# Patient Record
Sex: Female | Born: 1960
Health system: Southern US, Community
[De-identification: ages and names within clinical notes are randomized; demographics above are authoritative.]

## PROBLEM LIST (undated history)

## (undated) DIAGNOSIS — I10 Essential (primary) hypertension: Secondary | ICD-10-CM

## (undated) DIAGNOSIS — M797 Fibromyalgia: Secondary | ICD-10-CM

## (undated) DIAGNOSIS — M792 Neuralgia and neuritis, unspecified: Secondary | ICD-10-CM

## (undated) DIAGNOSIS — E119 Type 2 diabetes mellitus without complications: Secondary | ICD-10-CM

## (undated) DIAGNOSIS — I639 Cerebral infarction, unspecified: Secondary | ICD-10-CM

## (undated) DIAGNOSIS — R252 Cramp and spasm: Secondary | ICD-10-CM

## (undated) HISTORY — PX: CHOLECYSTECTOMY: SHX55

## (undated) HISTORY — PX: APPENDECTOMY: SHX54

## (undated) HISTORY — PX: BACK SURGERY: SHX140

## (undated) HISTORY — PX: ABDOMINAL HYSTERECTOMY: SHX81

## (undated) HISTORY — PX: OTHER SURGICAL HISTORY: SHX169

---

## 2004-11-10 ENCOUNTER — Emergency Department: Payer: Self-pay | Admitting: Emergency Medicine

## 2005-05-24 ENCOUNTER — Other Ambulatory Visit: Payer: Self-pay

## 2005-05-31 ENCOUNTER — Ambulatory Visit: Payer: Self-pay | Admitting: Unknown Physician Specialty

## 2005-06-28 ENCOUNTER — Emergency Department: Payer: Self-pay | Admitting: Emergency Medicine

## 2005-12-01 ENCOUNTER — Ambulatory Visit: Payer: Self-pay | Admitting: Internal Medicine

## 2005-12-04 ENCOUNTER — Ambulatory Visit: Payer: Self-pay | Admitting: Cardiology

## 2005-12-18 ENCOUNTER — Ambulatory Visit: Payer: Self-pay | Admitting: Unknown Physician Specialty

## 2006-02-27 HISTORY — PX: OTHER SURGICAL HISTORY: SHX169

## 2006-03-23 ENCOUNTER — Ambulatory Visit: Payer: Self-pay | Admitting: Unknown Physician Specialty

## 2006-06-29 ENCOUNTER — Ambulatory Visit: Payer: Self-pay | Admitting: Gastroenterology

## 2006-09-12 ENCOUNTER — Ambulatory Visit: Payer: Self-pay | Admitting: Pain Medicine

## 2006-10-15 ENCOUNTER — Ambulatory Visit: Payer: Self-pay | Admitting: Pain Medicine

## 2006-10-25 ENCOUNTER — Ambulatory Visit: Payer: Self-pay | Admitting: Pain Medicine

## 2006-11-12 ENCOUNTER — Ambulatory Visit: Payer: Self-pay | Admitting: Pain Medicine

## 2006-11-22 ENCOUNTER — Ambulatory Visit: Payer: Self-pay | Admitting: Pain Medicine

## 2006-11-23 ENCOUNTER — Ambulatory Visit: Payer: Self-pay | Admitting: Surgery

## 2006-11-28 ENCOUNTER — Ambulatory Visit: Payer: Self-pay | Admitting: Surgery

## 2007-03-13 ENCOUNTER — Ambulatory Visit: Payer: Self-pay | Admitting: Pain Medicine

## 2007-03-26 ENCOUNTER — Ambulatory Visit: Payer: Self-pay | Admitting: Pain Medicine

## 2007-04-11 ENCOUNTER — Ambulatory Visit: Payer: Self-pay | Admitting: Physician Assistant

## 2007-04-17 ENCOUNTER — Ambulatory Visit: Payer: Self-pay | Admitting: Internal Medicine

## 2007-05-07 ENCOUNTER — Ambulatory Visit: Payer: Self-pay | Admitting: Pain Medicine

## 2007-05-20 ENCOUNTER — Ambulatory Visit: Payer: Self-pay | Admitting: Pain Medicine

## 2007-06-18 ENCOUNTER — Ambulatory Visit: Payer: Self-pay | Admitting: Physician Assistant

## 2007-07-09 ENCOUNTER — Ambulatory Visit: Payer: Self-pay | Admitting: Pain Medicine

## 2007-07-17 ENCOUNTER — Ambulatory Visit: Payer: Self-pay | Admitting: Pain Medicine

## 2007-07-25 ENCOUNTER — Ambulatory Visit: Payer: Self-pay | Admitting: Neurological Surgery

## 2007-07-26 ENCOUNTER — Ambulatory Visit: Payer: Self-pay | Admitting: Neurological Surgery

## 2007-08-15 ENCOUNTER — Ambulatory Visit: Payer: Self-pay | Admitting: Physician Assistant

## 2007-09-18 ENCOUNTER — Ambulatory Visit: Payer: Self-pay | Admitting: Physician Assistant

## 2007-09-24 ENCOUNTER — Ambulatory Visit: Payer: Self-pay | Admitting: Pain Medicine

## 2007-10-15 ENCOUNTER — Ambulatory Visit: Payer: Self-pay | Admitting: Physician Assistant

## 2007-11-13 ENCOUNTER — Ambulatory Visit: Payer: Self-pay | Admitting: Pain Medicine

## 2007-12-12 ENCOUNTER — Ambulatory Visit: Payer: Self-pay | Admitting: Physician Assistant

## 2008-01-08 ENCOUNTER — Ambulatory Visit: Payer: Self-pay | Admitting: Physician Assistant

## 2008-02-05 ENCOUNTER — Ambulatory Visit: Payer: Self-pay | Admitting: Physician Assistant

## 2008-03-11 ENCOUNTER — Ambulatory Visit: Payer: Self-pay | Admitting: Physician Assistant

## 2008-03-21 ENCOUNTER — Ambulatory Visit: Payer: Self-pay | Admitting: Internal Medicine

## 2008-04-09 ENCOUNTER — Emergency Department: Payer: Self-pay | Admitting: Emergency Medicine

## 2008-04-10 ENCOUNTER — Ambulatory Visit: Payer: Self-pay | Admitting: Orthopedic Surgery

## 2008-04-14 ENCOUNTER — Ambulatory Visit: Payer: Self-pay | Admitting: Physician Assistant

## 2008-05-13 ENCOUNTER — Ambulatory Visit: Payer: Self-pay | Admitting: Physician Assistant

## 2008-06-10 ENCOUNTER — Ambulatory Visit: Payer: Self-pay | Admitting: Orthopedic Surgery

## 2008-08-12 ENCOUNTER — Ambulatory Visit: Payer: Self-pay | Admitting: Physician Assistant

## 2008-11-11 ENCOUNTER — Ambulatory Visit: Payer: Self-pay | Admitting: Physician Assistant

## 2008-12-22 ENCOUNTER — Ambulatory Visit: Payer: Self-pay | Admitting: Physician Assistant

## 2009-02-16 ENCOUNTER — Ambulatory Visit: Payer: Self-pay | Admitting: Physician Assistant

## 2009-04-19 ENCOUNTER — Ambulatory Visit: Payer: Self-pay | Admitting: Pain Medicine

## 2009-05-27 ENCOUNTER — Ambulatory Visit: Payer: Self-pay | Admitting: Pain Medicine

## 2009-06-10 ENCOUNTER — Ambulatory Visit: Payer: Self-pay | Admitting: Pain Medicine

## 2009-08-03 ENCOUNTER — Ambulatory Visit: Payer: Self-pay | Admitting: Urology

## 2009-08-17 ENCOUNTER — Ambulatory Visit: Payer: Self-pay | Admitting: Urology

## 2009-08-31 ENCOUNTER — Ambulatory Visit: Payer: Self-pay | Admitting: Urology

## 2010-09-12 ENCOUNTER — Ambulatory Visit: Payer: Self-pay | Admitting: Family

## 2010-11-09 ENCOUNTER — Ambulatory Visit: Payer: Self-pay | Admitting: Gastroenterology

## 2011-03-10 DIAGNOSIS — E119 Type 2 diabetes mellitus without complications: Secondary | ICD-10-CM | POA: Diagnosis not present

## 2011-03-10 DIAGNOSIS — F341 Dysthymic disorder: Secondary | ICD-10-CM | POA: Diagnosis not present

## 2011-03-10 DIAGNOSIS — R1011 Right upper quadrant pain: Secondary | ICD-10-CM | POA: Diagnosis not present

## 2011-03-10 DIAGNOSIS — G894 Chronic pain syndrome: Secondary | ICD-10-CM | POA: Diagnosis not present

## 2011-03-16 DIAGNOSIS — I10 Essential (primary) hypertension: Secondary | ICD-10-CM | POA: Diagnosis not present

## 2011-03-16 DIAGNOSIS — E119 Type 2 diabetes mellitus without complications: Secondary | ICD-10-CM | POA: Diagnosis not present

## 2011-03-16 DIAGNOSIS — Z794 Long term (current) use of insulin: Secondary | ICD-10-CM | POA: Diagnosis not present

## 2011-03-16 DIAGNOSIS — Z79899 Other long term (current) drug therapy: Secondary | ICD-10-CM | POA: Diagnosis not present

## 2011-03-16 DIAGNOSIS — R109 Unspecified abdominal pain: Secondary | ICD-10-CM | POA: Diagnosis not present

## 2011-03-16 DIAGNOSIS — M549 Dorsalgia, unspecified: Secondary | ICD-10-CM | POA: Diagnosis not present

## 2011-03-16 DIAGNOSIS — Z888 Allergy status to other drugs, medicaments and biological substances status: Secondary | ICD-10-CM | POA: Diagnosis not present

## 2011-04-07 DIAGNOSIS — M729 Fibroblastic disorder, unspecified: Secondary | ICD-10-CM | POA: Diagnosis not present

## 2011-04-07 DIAGNOSIS — G894 Chronic pain syndrome: Secondary | ICD-10-CM | POA: Diagnosis not present

## 2011-04-07 DIAGNOSIS — E119 Type 2 diabetes mellitus without complications: Secondary | ICD-10-CM | POA: Diagnosis not present

## 2011-04-07 DIAGNOSIS — Z87891 Personal history of nicotine dependence: Secondary | ICD-10-CM | POA: Diagnosis not present

## 2011-05-01 DIAGNOSIS — M729 Fibroblastic disorder, unspecified: Secondary | ICD-10-CM | POA: Diagnosis not present

## 2011-05-01 DIAGNOSIS — R112 Nausea with vomiting, unspecified: Secondary | ICD-10-CM | POA: Diagnosis not present

## 2011-05-01 DIAGNOSIS — G894 Chronic pain syndrome: Secondary | ICD-10-CM | POA: Diagnosis not present

## 2011-05-01 DIAGNOSIS — E119 Type 2 diabetes mellitus without complications: Secondary | ICD-10-CM | POA: Diagnosis not present

## 2011-05-03 ENCOUNTER — Ambulatory Visit: Payer: Self-pay | Admitting: Gastroenterology

## 2011-05-03 DIAGNOSIS — R1011 Right upper quadrant pain: Secondary | ICD-10-CM | POA: Diagnosis not present

## 2011-05-03 DIAGNOSIS — Z9079 Acquired absence of other genital organ(s): Secondary | ICD-10-CM | POA: Diagnosis not present

## 2011-05-03 DIAGNOSIS — I10 Essential (primary) hypertension: Secondary | ICD-10-CM | POA: Diagnosis not present

## 2011-05-03 DIAGNOSIS — Z79899 Other long term (current) drug therapy: Secondary | ICD-10-CM | POA: Diagnosis not present

## 2011-05-03 DIAGNOSIS — R1031 Right lower quadrant pain: Secondary | ICD-10-CM | POA: Diagnosis not present

## 2011-05-03 DIAGNOSIS — J449 Chronic obstructive pulmonary disease, unspecified: Secondary | ICD-10-CM | POA: Diagnosis not present

## 2011-05-03 DIAGNOSIS — Z9889 Other specified postprocedural states: Secondary | ICD-10-CM | POA: Diagnosis not present

## 2011-05-03 DIAGNOSIS — F17200 Nicotine dependence, unspecified, uncomplicated: Secondary | ICD-10-CM | POA: Diagnosis not present

## 2011-05-03 DIAGNOSIS — E119 Type 2 diabetes mellitus without complications: Secondary | ICD-10-CM | POA: Diagnosis not present

## 2011-05-03 DIAGNOSIS — K5730 Diverticulosis of large intestine without perforation or abscess without bleeding: Secondary | ICD-10-CM | POA: Diagnosis not present

## 2011-05-03 DIAGNOSIS — R1310 Dysphagia, unspecified: Secondary | ICD-10-CM | POA: Diagnosis not present

## 2011-05-30 DIAGNOSIS — E119 Type 2 diabetes mellitus without complications: Secondary | ICD-10-CM | POA: Diagnosis not present

## 2011-05-30 DIAGNOSIS — G894 Chronic pain syndrome: Secondary | ICD-10-CM | POA: Diagnosis not present

## 2011-05-30 DIAGNOSIS — M729 Fibroblastic disorder, unspecified: Secondary | ICD-10-CM | POA: Diagnosis not present

## 2011-05-30 DIAGNOSIS — N958 Other specified menopausal and perimenopausal disorders: Secondary | ICD-10-CM | POA: Diagnosis not present

## 2011-07-04 DIAGNOSIS — E119 Type 2 diabetes mellitus without complications: Secondary | ICD-10-CM | POA: Diagnosis not present

## 2011-07-04 DIAGNOSIS — M549 Dorsalgia, unspecified: Secondary | ICD-10-CM | POA: Diagnosis not present

## 2011-07-04 DIAGNOSIS — G894 Chronic pain syndrome: Secondary | ICD-10-CM | POA: Diagnosis not present

## 2011-07-04 DIAGNOSIS — M729 Fibroblastic disorder, unspecified: Secondary | ICD-10-CM | POA: Diagnosis not present

## 2011-08-01 DIAGNOSIS — G894 Chronic pain syndrome: Secondary | ICD-10-CM | POA: Diagnosis not present

## 2011-08-01 DIAGNOSIS — E119 Type 2 diabetes mellitus without complications: Secondary | ICD-10-CM | POA: Diagnosis not present

## 2011-08-01 DIAGNOSIS — K5909 Other constipation: Secondary | ICD-10-CM | POA: Diagnosis not present

## 2011-08-01 DIAGNOSIS — IMO0001 Reserved for inherently not codable concepts without codable children: Secondary | ICD-10-CM | POA: Diagnosis not present

## 2011-08-22 ENCOUNTER — Other Ambulatory Visit: Payer: Self-pay | Admitting: Family

## 2011-08-22 DIAGNOSIS — E119 Type 2 diabetes mellitus without complications: Secondary | ICD-10-CM | POA: Diagnosis not present

## 2011-08-22 DIAGNOSIS — I10 Essential (primary) hypertension: Secondary | ICD-10-CM | POA: Diagnosis not present

## 2011-08-22 DIAGNOSIS — E785 Hyperlipidemia, unspecified: Secondary | ICD-10-CM | POA: Diagnosis not present

## 2011-08-22 LAB — CBC WITH DIFFERENTIAL/PLATELET
Basophil #: 0.1 10*3/uL (ref 0.0–0.1)
Basophil %: 0.5 %
Eosinophil #: 0.3 10*3/uL (ref 0.0–0.7)
HCT: 46 % (ref 35.0–47.0)
HGB: 16 g/dL (ref 12.0–16.0)
Lymphocyte #: 2.8 10*3/uL (ref 1.0–3.6)
MCH: 31.9 pg (ref 26.0–34.0)
MCV: 92 fL (ref 80–100)
Monocyte #: 0.6 x10 3/mm (ref 0.2–0.9)
Monocyte %: 3.7 %
Neutrophil %: 76 %
Platelet: 217 10*3/uL (ref 150–440)
RBC: 5.02 10*6/uL (ref 3.80–5.20)
WBC: 15.6 10*3/uL — ABNORMAL HIGH (ref 3.6–11.0)

## 2011-08-22 LAB — BASIC METABOLIC PANEL
Anion Gap: 6 — ABNORMAL LOW (ref 7–16)
BUN: 19 mg/dL — ABNORMAL HIGH (ref 7–18)
Creatinine: 0.85 mg/dL (ref 0.60–1.30)
EGFR (Non-African Amer.): >60
Glucose: 106 mg/dL — ABNORMAL HIGH (ref 65–99)
Sodium: 139 mmol/L (ref 136–145)

## 2011-08-22 LAB — LIPID PANEL
Cholesterol: 227 mg/dL — ABNORMAL HIGH (ref 0–200)
HDL Cholesterol: 30 mg/dL — ABNORMAL LOW (ref 40–60)
Triglycerides: 410 mg/dL — ABNORMAL HIGH (ref 0–200)

## 2011-08-22 LAB — HEMOGLOBIN A1C: Hemoglobin A1C: 11.6 % — ABNORMAL HIGH (ref 4.2–6.3)

## 2011-08-29 DIAGNOSIS — G894 Chronic pain syndrome: Secondary | ICD-10-CM | POA: Diagnosis not present

## 2011-08-29 DIAGNOSIS — E119 Type 2 diabetes mellitus without complications: Secondary | ICD-10-CM | POA: Diagnosis not present

## 2011-08-29 DIAGNOSIS — E785 Hyperlipidemia, unspecified: Secondary | ICD-10-CM | POA: Diagnosis not present

## 2011-08-29 DIAGNOSIS — M729 Fibroblastic disorder, unspecified: Secondary | ICD-10-CM | POA: Diagnosis not present

## 2011-09-28 DIAGNOSIS — R6884 Jaw pain: Secondary | ICD-10-CM | POA: Diagnosis not present

## 2011-09-28 DIAGNOSIS — E785 Hyperlipidemia, unspecified: Secondary | ICD-10-CM | POA: Diagnosis not present

## 2011-09-28 DIAGNOSIS — E119 Type 2 diabetes mellitus without complications: Secondary | ICD-10-CM | POA: Diagnosis not present

## 2011-09-28 DIAGNOSIS — G894 Chronic pain syndrome: Secondary | ICD-10-CM | POA: Diagnosis not present

## 2011-10-12 ENCOUNTER — Inpatient Hospital Stay: Payer: Self-pay | Admitting: Internal Medicine

## 2011-10-12 DIAGNOSIS — K5730 Diverticulosis of large intestine without perforation or abscess without bleeding: Secondary | ICD-10-CM | POA: Diagnosis present

## 2011-10-12 DIAGNOSIS — L03119 Cellulitis of unspecified part of limb: Secondary | ICD-10-CM | POA: Diagnosis not present

## 2011-10-12 DIAGNOSIS — N312 Flaccid neuropathic bladder, not elsewhere classified: Secondary | ICD-10-CM | POA: Diagnosis present

## 2011-10-12 DIAGNOSIS — M79609 Pain in unspecified limb: Secondary | ICD-10-CM | POA: Diagnosis not present

## 2011-10-12 DIAGNOSIS — E1169 Type 2 diabetes mellitus with other specified complication: Secondary | ICD-10-CM | POA: Diagnosis present

## 2011-10-12 DIAGNOSIS — E119 Type 2 diabetes mellitus without complications: Secondary | ICD-10-CM | POA: Diagnosis not present

## 2011-10-12 DIAGNOSIS — Z794 Long term (current) use of insulin: Secondary | ICD-10-CM | POA: Diagnosis not present

## 2011-10-12 DIAGNOSIS — G894 Chronic pain syndrome: Secondary | ICD-10-CM | POA: Diagnosis present

## 2011-10-12 DIAGNOSIS — I10 Essential (primary) hypertension: Secondary | ICD-10-CM | POA: Diagnosis not present

## 2011-10-12 DIAGNOSIS — L97509 Non-pressure chronic ulcer of other part of unspecified foot with unspecified severity: Secondary | ICD-10-CM | POA: Diagnosis present

## 2011-10-12 DIAGNOSIS — E1149 Type 2 diabetes mellitus with other diabetic neurological complication: Secondary | ICD-10-CM | POA: Diagnosis not present

## 2011-10-12 DIAGNOSIS — Z01810 Encounter for preprocedural cardiovascular examination: Secondary | ICD-10-CM | POA: Diagnosis not present

## 2011-10-12 DIAGNOSIS — Z23 Encounter for immunization: Secondary | ICD-10-CM | POA: Diagnosis not present

## 2011-10-12 DIAGNOSIS — IMO0001 Reserved for inherently not codable concepts without codable children: Secondary | ICD-10-CM | POA: Diagnosis not present

## 2011-10-12 DIAGNOSIS — A4901 Methicillin susceptible Staphylococcus aureus infection, unspecified site: Secondary | ICD-10-CM | POA: Diagnosis present

## 2011-10-12 DIAGNOSIS — L03039 Cellulitis of unspecified toe: Secondary | ICD-10-CM | POA: Diagnosis not present

## 2011-10-12 DIAGNOSIS — F17200 Nicotine dependence, unspecified, uncomplicated: Secondary | ICD-10-CM | POA: Diagnosis not present

## 2011-10-12 DIAGNOSIS — B950 Streptococcus, group A, as the cause of diseases classified elsewhere: Secondary | ICD-10-CM | POA: Diagnosis present

## 2011-10-12 DIAGNOSIS — R079 Chest pain, unspecified: Secondary | ICD-10-CM | POA: Diagnosis not present

## 2011-10-12 DIAGNOSIS — G909 Disorder of the autonomic nervous system, unspecified: Secondary | ICD-10-CM | POA: Diagnosis not present

## 2011-10-12 DIAGNOSIS — K5900 Constipation, unspecified: Secondary | ICD-10-CM | POA: Diagnosis present

## 2011-10-12 DIAGNOSIS — L02619 Cutaneous abscess of unspecified foot: Secondary | ICD-10-CM | POA: Diagnosis not present

## 2011-10-12 DIAGNOSIS — E1165 Type 2 diabetes mellitus with hyperglycemia: Secondary | ICD-10-CM | POA: Diagnosis not present

## 2011-10-12 DIAGNOSIS — Z9889 Other specified postprocedural states: Secondary | ICD-10-CM | POA: Diagnosis not present

## 2011-10-12 DIAGNOSIS — L02419 Cutaneous abscess of limb, unspecified: Secondary | ICD-10-CM | POA: Diagnosis not present

## 2011-10-12 LAB — COMPREHENSIVE METABOLIC PANEL
Albumin: 3.1 g/dL — ABNORMAL LOW (ref 3.4–5.0)
Alkaline Phosphatase: 96 U/L (ref 50–136)
Anion Gap: 7 (ref 7–16)
BUN: 25 mg/dL — ABNORMAL HIGH (ref 7–18)
Calcium, Total: 9.2 mg/dL (ref 8.5–10.1)
Chloride: 98 mmol/L (ref 98–107)
EGFR (African American): >60
Glucose: 303 mg/dL — ABNORMAL HIGH (ref 65–99)
Potassium: 4.1 mmol/L (ref 3.5–5.1)
SGOT(AST): 18 U/L (ref 15–37)
SGPT (ALT): 19 U/L (ref 12–78)
Sodium: 136 mmol/L (ref 136–145)
Total Protein: 7.1 g/dL (ref 6.4–8.2)

## 2011-10-12 LAB — APTT: Activated PTT: 26.3 secs (ref 23.6–35.9)

## 2011-10-12 LAB — CBC
MCHC: 34.3 g/dL (ref 32.0–36.0)
MCV: 91 fL (ref 80–100)
Platelet: 244 10*3/uL (ref 150–440)
RBC: 4.72 10*6/uL (ref 3.80–5.20)
RDW: 12.7 % (ref 11.5–14.5)
WBC: 14.4 10*3/uL — ABNORMAL HIGH (ref 3.6–11.0)

## 2011-10-12 LAB — URINALYSIS, COMPLETE
Bacteria: NONE SEEN
Bilirubin,UR: NEGATIVE
Blood: NEGATIVE
Glucose,UR: 150 mg/dL (ref 0–75)
Hyaline Cast: 7
Ketone: NEGATIVE
Ph: 6 (ref 4.5–8.0)
Protein: NEGATIVE
RBC,UR: <1 /HPF (ref 0–5)
Specific Gravity: 1.016 (ref 1.003–1.030)
Squamous Epithelial: <1
WBC UR: 1 /HPF (ref 0–5)

## 2011-10-12 LAB — PROTIME-INR: INR: 0.8

## 2011-10-13 DIAGNOSIS — L02619 Cutaneous abscess of unspecified foot: Secondary | ICD-10-CM | POA: Diagnosis not present

## 2011-10-13 DIAGNOSIS — L03039 Cellulitis of unspecified toe: Secondary | ICD-10-CM | POA: Diagnosis not present

## 2011-10-13 LAB — BASIC METABOLIC PANEL
Calcium, Total: 9.4 mg/dL (ref 8.5–10.1)
EGFR (Non-African Amer.): >60
Glucose: 306 mg/dL — ABNORMAL HIGH (ref 65–99)
Osmolality: 288 (ref 275–301)
Potassium: 3.9 mmol/L (ref 3.5–5.1)
Sodium: 135 mmol/L — ABNORMAL LOW (ref 136–145)

## 2011-10-13 LAB — CBC WITH DIFFERENTIAL/PLATELET
Basophil #: 0.1 10*3/uL (ref 0.0–0.1)
Basophil %: 0.6 %
Eosinophil #: 0.3 10*3/uL (ref 0.0–0.7)
Lymphocyte #: 2.5 10*3/uL (ref 1.0–3.6)
Lymphocyte %: 26.9 %
MCHC: 34.4 g/dL (ref 32.0–36.0)
Monocyte #: 0.8 x10 3/mm (ref 0.2–0.9)
Monocyte %: 8.5 %
Neutrophil #: 5.6 10*3/uL (ref 1.4–6.5)
Neutrophil %: 60.5 %
Platelet: 237 10*3/uL (ref 150–440)
RDW: 12.8 % (ref 11.5–14.5)
WBC: 9.2 10*3/uL (ref 3.6–11.0)

## 2011-10-13 LAB — URINE CULTURE

## 2011-10-13 LAB — HEMOGLOBIN A1C: Hemoglobin A1C: 11.4 % — ABNORMAL HIGH (ref 4.2–6.3)

## 2011-10-15 LAB — BASIC METABOLIC PANEL
Anion Gap: 5 — ABNORMAL LOW (ref 7–16)
BUN: 17 mg/dL (ref 7–18)
Calcium, Total: 9.3 mg/dL (ref 8.5–10.1)
Chloride: 103 mmol/L (ref 98–107)
EGFR (African American): >60
EGFR (Non-African Amer.): >60
Glucose: 107 mg/dL — ABNORMAL HIGH (ref 65–99)
Osmolality: 283 (ref 275–301)
Potassium: 4.2 mmol/L (ref 3.5–5.1)

## 2011-10-16 LAB — CULTURE, BLOOD (SINGLE)

## 2011-10-19 DIAGNOSIS — L97509 Non-pressure chronic ulcer of other part of unspecified foot with unspecified severity: Secondary | ICD-10-CM | POA: Diagnosis not present

## 2011-10-19 DIAGNOSIS — L02619 Cutaneous abscess of unspecified foot: Secondary | ICD-10-CM | POA: Diagnosis not present

## 2011-10-19 DIAGNOSIS — K5730 Diverticulosis of large intestine without perforation or abscess without bleeding: Secondary | ICD-10-CM | POA: Diagnosis not present

## 2011-10-19 DIAGNOSIS — L03119 Cellulitis of unspecified part of limb: Secondary | ICD-10-CM | POA: Diagnosis not present

## 2011-10-19 DIAGNOSIS — G894 Chronic pain syndrome: Secondary | ICD-10-CM | POA: Diagnosis not present

## 2011-10-19 DIAGNOSIS — IMO0001 Reserved for inherently not codable concepts without codable children: Secondary | ICD-10-CM | POA: Diagnosis not present

## 2011-10-19 DIAGNOSIS — E1169 Type 2 diabetes mellitus with other specified complication: Secondary | ICD-10-CM | POA: Diagnosis not present

## 2011-10-20 DIAGNOSIS — K5730 Diverticulosis of large intestine without perforation or abscess without bleeding: Secondary | ICD-10-CM | POA: Diagnosis not present

## 2011-10-20 DIAGNOSIS — L97509 Non-pressure chronic ulcer of other part of unspecified foot with unspecified severity: Secondary | ICD-10-CM | POA: Diagnosis not present

## 2011-10-20 DIAGNOSIS — G894 Chronic pain syndrome: Secondary | ICD-10-CM | POA: Diagnosis not present

## 2011-10-20 DIAGNOSIS — E1169 Type 2 diabetes mellitus with other specified complication: Secondary | ICD-10-CM | POA: Diagnosis not present

## 2011-10-20 DIAGNOSIS — IMO0001 Reserved for inherently not codable concepts without codable children: Secondary | ICD-10-CM | POA: Diagnosis not present

## 2011-10-20 DIAGNOSIS — L03119 Cellulitis of unspecified part of limb: Secondary | ICD-10-CM | POA: Diagnosis not present

## 2011-10-23 DIAGNOSIS — E1169 Type 2 diabetes mellitus with other specified complication: Secondary | ICD-10-CM | POA: Diagnosis not present

## 2011-10-23 DIAGNOSIS — K5730 Diverticulosis of large intestine without perforation or abscess without bleeding: Secondary | ICD-10-CM | POA: Diagnosis not present

## 2011-10-23 DIAGNOSIS — L03119 Cellulitis of unspecified part of limb: Secondary | ICD-10-CM | POA: Diagnosis not present

## 2011-10-23 DIAGNOSIS — G894 Chronic pain syndrome: Secondary | ICD-10-CM | POA: Diagnosis not present

## 2011-10-23 DIAGNOSIS — IMO0001 Reserved for inherently not codable concepts without codable children: Secondary | ICD-10-CM | POA: Diagnosis not present

## 2011-10-23 DIAGNOSIS — L97509 Non-pressure chronic ulcer of other part of unspecified foot with unspecified severity: Secondary | ICD-10-CM | POA: Diagnosis not present

## 2011-10-25 DIAGNOSIS — E1149 Type 2 diabetes mellitus with other diabetic neurological complication: Secondary | ICD-10-CM | POA: Diagnosis not present

## 2011-10-25 DIAGNOSIS — E1169 Type 2 diabetes mellitus with other specified complication: Secondary | ICD-10-CM | POA: Diagnosis not present

## 2011-10-25 DIAGNOSIS — L02619 Cutaneous abscess of unspecified foot: Secondary | ICD-10-CM | POA: Diagnosis not present

## 2011-10-25 DIAGNOSIS — L03119 Cellulitis of unspecified part of limb: Secondary | ICD-10-CM | POA: Diagnosis not present

## 2011-10-25 DIAGNOSIS — G909 Disorder of the autonomic nervous system, unspecified: Secondary | ICD-10-CM | POA: Diagnosis not present

## 2011-10-25 DIAGNOSIS — K5730 Diverticulosis of large intestine without perforation or abscess without bleeding: Secondary | ICD-10-CM | POA: Diagnosis not present

## 2011-10-25 DIAGNOSIS — G894 Chronic pain syndrome: Secondary | ICD-10-CM | POA: Diagnosis not present

## 2011-10-25 DIAGNOSIS — IMO0001 Reserved for inherently not codable concepts without codable children: Secondary | ICD-10-CM | POA: Diagnosis not present

## 2011-10-25 DIAGNOSIS — L97509 Non-pressure chronic ulcer of other part of unspecified foot with unspecified severity: Secondary | ICD-10-CM | POA: Diagnosis not present

## 2011-11-01 DIAGNOSIS — K5730 Diverticulosis of large intestine without perforation or abscess without bleeding: Secondary | ICD-10-CM | POA: Diagnosis not present

## 2011-11-01 DIAGNOSIS — G894 Chronic pain syndrome: Secondary | ICD-10-CM | POA: Diagnosis not present

## 2011-11-01 DIAGNOSIS — E1169 Type 2 diabetes mellitus with other specified complication: Secondary | ICD-10-CM | POA: Diagnosis not present

## 2011-11-01 DIAGNOSIS — L97509 Non-pressure chronic ulcer of other part of unspecified foot with unspecified severity: Secondary | ICD-10-CM | POA: Diagnosis not present

## 2011-11-01 DIAGNOSIS — IMO0001 Reserved for inherently not codable concepts without codable children: Secondary | ICD-10-CM | POA: Diagnosis not present

## 2011-11-01 DIAGNOSIS — L02619 Cutaneous abscess of unspecified foot: Secondary | ICD-10-CM | POA: Diagnosis not present

## 2011-11-02 DIAGNOSIS — F17200 Nicotine dependence, unspecified, uncomplicated: Secondary | ICD-10-CM | POA: Diagnosis not present

## 2011-11-02 DIAGNOSIS — R110 Nausea: Secondary | ICD-10-CM | POA: Diagnosis not present

## 2011-11-02 DIAGNOSIS — G894 Chronic pain syndrome: Secondary | ICD-10-CM | POA: Diagnosis not present

## 2011-11-02 DIAGNOSIS — E119 Type 2 diabetes mellitus without complications: Secondary | ICD-10-CM | POA: Diagnosis not present

## 2011-11-03 DIAGNOSIS — K5730 Diverticulosis of large intestine without perforation or abscess without bleeding: Secondary | ICD-10-CM | POA: Diagnosis not present

## 2011-11-03 DIAGNOSIS — L03119 Cellulitis of unspecified part of limb: Secondary | ICD-10-CM | POA: Diagnosis not present

## 2011-11-03 DIAGNOSIS — L97509 Non-pressure chronic ulcer of other part of unspecified foot with unspecified severity: Secondary | ICD-10-CM | POA: Diagnosis not present

## 2011-11-03 DIAGNOSIS — IMO0001 Reserved for inherently not codable concepts without codable children: Secondary | ICD-10-CM | POA: Diagnosis not present

## 2011-11-03 DIAGNOSIS — E1169 Type 2 diabetes mellitus with other specified complication: Secondary | ICD-10-CM | POA: Diagnosis not present

## 2011-11-03 DIAGNOSIS — G894 Chronic pain syndrome: Secondary | ICD-10-CM | POA: Diagnosis not present

## 2011-11-07 DIAGNOSIS — K5730 Diverticulosis of large intestine without perforation or abscess without bleeding: Secondary | ICD-10-CM | POA: Diagnosis not present

## 2011-11-07 DIAGNOSIS — L03119 Cellulitis of unspecified part of limb: Secondary | ICD-10-CM | POA: Diagnosis not present

## 2011-11-07 DIAGNOSIS — IMO0001 Reserved for inherently not codable concepts without codable children: Secondary | ICD-10-CM | POA: Diagnosis not present

## 2011-11-07 DIAGNOSIS — L97509 Non-pressure chronic ulcer of other part of unspecified foot with unspecified severity: Secondary | ICD-10-CM | POA: Diagnosis not present

## 2011-11-07 DIAGNOSIS — G894 Chronic pain syndrome: Secondary | ICD-10-CM | POA: Diagnosis not present

## 2011-11-07 DIAGNOSIS — E1169 Type 2 diabetes mellitus with other specified complication: Secondary | ICD-10-CM | POA: Diagnosis not present

## 2011-11-08 DIAGNOSIS — E1149 Type 2 diabetes mellitus with other diabetic neurological complication: Secondary | ICD-10-CM | POA: Diagnosis not present

## 2011-11-08 DIAGNOSIS — L03119 Cellulitis of unspecified part of limb: Secondary | ICD-10-CM | POA: Diagnosis not present

## 2011-11-08 DIAGNOSIS — L02619 Cutaneous abscess of unspecified foot: Secondary | ICD-10-CM | POA: Diagnosis not present

## 2011-11-08 DIAGNOSIS — G909 Disorder of the autonomic nervous system, unspecified: Secondary | ICD-10-CM | POA: Diagnosis not present

## 2011-11-09 DIAGNOSIS — G894 Chronic pain syndrome: Secondary | ICD-10-CM | POA: Diagnosis not present

## 2011-11-09 DIAGNOSIS — IMO0001 Reserved for inherently not codable concepts without codable children: Secondary | ICD-10-CM | POA: Diagnosis not present

## 2011-11-09 DIAGNOSIS — K5730 Diverticulosis of large intestine without perforation or abscess without bleeding: Secondary | ICD-10-CM | POA: Diagnosis not present

## 2011-11-09 DIAGNOSIS — L02619 Cutaneous abscess of unspecified foot: Secondary | ICD-10-CM | POA: Diagnosis not present

## 2011-11-09 DIAGNOSIS — E1169 Type 2 diabetes mellitus with other specified complication: Secondary | ICD-10-CM | POA: Diagnosis not present

## 2011-11-09 DIAGNOSIS — L97509 Non-pressure chronic ulcer of other part of unspecified foot with unspecified severity: Secondary | ICD-10-CM | POA: Diagnosis not present

## 2011-11-15 DIAGNOSIS — L02619 Cutaneous abscess of unspecified foot: Secondary | ICD-10-CM | POA: Diagnosis not present

## 2011-11-15 DIAGNOSIS — E1169 Type 2 diabetes mellitus with other specified complication: Secondary | ICD-10-CM | POA: Diagnosis not present

## 2011-11-15 DIAGNOSIS — L97509 Non-pressure chronic ulcer of other part of unspecified foot with unspecified severity: Secondary | ICD-10-CM | POA: Diagnosis not present

## 2011-11-15 DIAGNOSIS — G894 Chronic pain syndrome: Secondary | ICD-10-CM | POA: Diagnosis not present

## 2011-11-15 DIAGNOSIS — K5730 Diverticulosis of large intestine without perforation or abscess without bleeding: Secondary | ICD-10-CM | POA: Diagnosis not present

## 2011-11-15 DIAGNOSIS — IMO0001 Reserved for inherently not codable concepts without codable children: Secondary | ICD-10-CM | POA: Diagnosis not present

## 2011-11-17 DIAGNOSIS — L03119 Cellulitis of unspecified part of limb: Secondary | ICD-10-CM | POA: Diagnosis not present

## 2011-11-17 DIAGNOSIS — K5730 Diverticulosis of large intestine without perforation or abscess without bleeding: Secondary | ICD-10-CM | POA: Diagnosis not present

## 2011-11-17 DIAGNOSIS — G894 Chronic pain syndrome: Secondary | ICD-10-CM | POA: Diagnosis not present

## 2011-11-17 DIAGNOSIS — E1169 Type 2 diabetes mellitus with other specified complication: Secondary | ICD-10-CM | POA: Diagnosis not present

## 2011-11-17 DIAGNOSIS — L97509 Non-pressure chronic ulcer of other part of unspecified foot with unspecified severity: Secondary | ICD-10-CM | POA: Diagnosis not present

## 2011-11-17 DIAGNOSIS — IMO0001 Reserved for inherently not codable concepts without codable children: Secondary | ICD-10-CM | POA: Diagnosis not present

## 2011-11-21 DIAGNOSIS — L02619 Cutaneous abscess of unspecified foot: Secondary | ICD-10-CM | POA: Diagnosis not present

## 2011-11-21 DIAGNOSIS — G894 Chronic pain syndrome: Secondary | ICD-10-CM | POA: Diagnosis not present

## 2011-11-21 DIAGNOSIS — E1169 Type 2 diabetes mellitus with other specified complication: Secondary | ICD-10-CM | POA: Diagnosis not present

## 2011-11-21 DIAGNOSIS — K5730 Diverticulosis of large intestine without perforation or abscess without bleeding: Secondary | ICD-10-CM | POA: Diagnosis not present

## 2011-11-21 DIAGNOSIS — L97509 Non-pressure chronic ulcer of other part of unspecified foot with unspecified severity: Secondary | ICD-10-CM | POA: Diagnosis not present

## 2011-11-21 DIAGNOSIS — IMO0001 Reserved for inherently not codable concepts without codable children: Secondary | ICD-10-CM | POA: Diagnosis not present

## 2011-11-23 DIAGNOSIS — L03119 Cellulitis of unspecified part of limb: Secondary | ICD-10-CM | POA: Diagnosis not present

## 2011-11-23 DIAGNOSIS — K5730 Diverticulosis of large intestine without perforation or abscess without bleeding: Secondary | ICD-10-CM | POA: Diagnosis not present

## 2011-11-23 DIAGNOSIS — IMO0001 Reserved for inherently not codable concepts without codable children: Secondary | ICD-10-CM | POA: Diagnosis not present

## 2011-11-23 DIAGNOSIS — L97509 Non-pressure chronic ulcer of other part of unspecified foot with unspecified severity: Secondary | ICD-10-CM | POA: Diagnosis not present

## 2011-11-23 DIAGNOSIS — G894 Chronic pain syndrome: Secondary | ICD-10-CM | POA: Diagnosis not present

## 2011-11-23 DIAGNOSIS — E1169 Type 2 diabetes mellitus with other specified complication: Secondary | ICD-10-CM | POA: Diagnosis not present

## 2011-11-23 DIAGNOSIS — L02619 Cutaneous abscess of unspecified foot: Secondary | ICD-10-CM | POA: Diagnosis not present

## 2011-12-01 DIAGNOSIS — G909 Disorder of the autonomic nervous system, unspecified: Secondary | ICD-10-CM | POA: Diagnosis not present

## 2011-12-01 DIAGNOSIS — G894 Chronic pain syndrome: Secondary | ICD-10-CM | POA: Diagnosis not present

## 2011-12-01 DIAGNOSIS — E1149 Type 2 diabetes mellitus with other diabetic neurological complication: Secondary | ICD-10-CM | POA: Diagnosis not present

## 2011-12-01 DIAGNOSIS — I129 Hypertensive chronic kidney disease with stage 1 through stage 4 chronic kidney disease, or unspecified chronic kidney disease: Secondary | ICD-10-CM | POA: Diagnosis not present

## 2011-12-08 DIAGNOSIS — Z23 Encounter for immunization: Secondary | ICD-10-CM | POA: Diagnosis not present

## 2011-12-29 DIAGNOSIS — G894 Chronic pain syndrome: Secondary | ICD-10-CM | POA: Diagnosis not present

## 2011-12-29 DIAGNOSIS — I129 Hypertensive chronic kidney disease with stage 1 through stage 4 chronic kidney disease, or unspecified chronic kidney disease: Secondary | ICD-10-CM | POA: Diagnosis not present

## 2011-12-29 DIAGNOSIS — E1129 Type 2 diabetes mellitus with other diabetic kidney complication: Secondary | ICD-10-CM | POA: Diagnosis not present

## 2012-01-29 DIAGNOSIS — G894 Chronic pain syndrome: Secondary | ICD-10-CM | POA: Diagnosis not present

## 2012-01-29 DIAGNOSIS — E1029 Type 1 diabetes mellitus with other diabetic kidney complication: Secondary | ICD-10-CM | POA: Diagnosis not present

## 2012-01-29 DIAGNOSIS — I129 Hypertensive chronic kidney disease with stage 1 through stage 4 chronic kidney disease, or unspecified chronic kidney disease: Secondary | ICD-10-CM | POA: Diagnosis not present

## 2012-02-29 DIAGNOSIS — E785 Hyperlipidemia, unspecified: Secondary | ICD-10-CM | POA: Diagnosis not present

## 2012-02-29 DIAGNOSIS — I10 Essential (primary) hypertension: Secondary | ICD-10-CM | POA: Diagnosis not present

## 2012-02-29 DIAGNOSIS — Z87891 Personal history of nicotine dependence: Secondary | ICD-10-CM | POA: Diagnosis not present

## 2012-02-29 DIAGNOSIS — G894 Chronic pain syndrome: Secondary | ICD-10-CM | POA: Diagnosis not present

## 2012-04-02 DIAGNOSIS — E785 Hyperlipidemia, unspecified: Secondary | ICD-10-CM | POA: Diagnosis not present

## 2012-04-02 DIAGNOSIS — N3289 Other specified disorders of bladder: Secondary | ICD-10-CM | POA: Diagnosis not present

## 2012-04-02 DIAGNOSIS — G894 Chronic pain syndrome: Secondary | ICD-10-CM | POA: Diagnosis not present

## 2012-04-02 DIAGNOSIS — E1129 Type 2 diabetes mellitus with other diabetic kidney complication: Secondary | ICD-10-CM | POA: Diagnosis not present

## 2012-04-25 DIAGNOSIS — I10 Essential (primary) hypertension: Secondary | ICD-10-CM | POA: Diagnosis not present

## 2012-04-25 DIAGNOSIS — E119 Type 2 diabetes mellitus without complications: Secondary | ICD-10-CM | POA: Diagnosis not present

## 2012-04-29 DIAGNOSIS — E785 Hyperlipidemia, unspecified: Secondary | ICD-10-CM | POA: Diagnosis not present

## 2012-04-29 DIAGNOSIS — E1129 Type 2 diabetes mellitus with other diabetic kidney complication: Secondary | ICD-10-CM | POA: Diagnosis not present

## 2012-04-29 DIAGNOSIS — M549 Dorsalgia, unspecified: Secondary | ICD-10-CM | POA: Diagnosis not present

## 2012-06-03 DIAGNOSIS — G894 Chronic pain syndrome: Secondary | ICD-10-CM | POA: Diagnosis not present

## 2012-06-03 DIAGNOSIS — I129 Hypertensive chronic kidney disease with stage 1 through stage 4 chronic kidney disease, or unspecified chronic kidney disease: Secondary | ICD-10-CM | POA: Diagnosis not present

## 2012-06-03 DIAGNOSIS — N189 Chronic kidney disease, unspecified: Secondary | ICD-10-CM | POA: Diagnosis not present

## 2012-06-03 DIAGNOSIS — E1129 Type 2 diabetes mellitus with other diabetic kidney complication: Secondary | ICD-10-CM | POA: Diagnosis not present

## 2012-07-04 DIAGNOSIS — I129 Hypertensive chronic kidney disease with stage 1 through stage 4 chronic kidney disease, or unspecified chronic kidney disease: Secondary | ICD-10-CM | POA: Diagnosis not present

## 2012-07-04 DIAGNOSIS — E1129 Type 2 diabetes mellitus with other diabetic kidney complication: Secondary | ICD-10-CM | POA: Diagnosis not present

## 2012-07-04 DIAGNOSIS — N189 Chronic kidney disease, unspecified: Secondary | ICD-10-CM | POA: Diagnosis not present

## 2012-08-05 DIAGNOSIS — G894 Chronic pain syndrome: Secondary | ICD-10-CM | POA: Diagnosis not present

## 2012-08-05 DIAGNOSIS — E1129 Type 2 diabetes mellitus with other diabetic kidney complication: Secondary | ICD-10-CM | POA: Diagnosis not present

## 2012-08-05 DIAGNOSIS — N058 Unspecified nephritic syndrome with other morphologic changes: Secondary | ICD-10-CM | POA: Diagnosis not present

## 2012-08-05 DIAGNOSIS — I129 Hypertensive chronic kidney disease with stage 1 through stage 4 chronic kidney disease, or unspecified chronic kidney disease: Secondary | ICD-10-CM | POA: Diagnosis not present

## 2012-08-28 DIAGNOSIS — E11319 Type 2 diabetes mellitus with unspecified diabetic retinopathy without macular edema: Secondary | ICD-10-CM | POA: Diagnosis not present

## 2012-08-28 DIAGNOSIS — E1149 Type 2 diabetes mellitus with other diabetic neurological complication: Secondary | ICD-10-CM | POA: Diagnosis not present

## 2012-08-28 DIAGNOSIS — G909 Disorder of the autonomic nervous system, unspecified: Secondary | ICD-10-CM | POA: Diagnosis not present

## 2012-08-28 DIAGNOSIS — A4902 Methicillin resistant Staphylococcus aureus infection, unspecified site: Secondary | ICD-10-CM | POA: Diagnosis not present

## 2012-09-03 DIAGNOSIS — F17200 Nicotine dependence, unspecified, uncomplicated: Secondary | ICD-10-CM | POA: Diagnosis not present

## 2012-09-03 DIAGNOSIS — G894 Chronic pain syndrome: Secondary | ICD-10-CM | POA: Diagnosis not present

## 2012-09-03 DIAGNOSIS — E1129 Type 2 diabetes mellitus with other diabetic kidney complication: Secondary | ICD-10-CM | POA: Diagnosis not present

## 2012-09-03 DIAGNOSIS — N189 Chronic kidney disease, unspecified: Secondary | ICD-10-CM | POA: Diagnosis not present

## 2012-10-08 DIAGNOSIS — E1129 Type 2 diabetes mellitus with other diabetic kidney complication: Secondary | ICD-10-CM | POA: Diagnosis not present

## 2012-10-08 DIAGNOSIS — G894 Chronic pain syndrome: Secondary | ICD-10-CM | POA: Diagnosis not present

## 2012-10-08 DIAGNOSIS — I129 Hypertensive chronic kidney disease with stage 1 through stage 4 chronic kidney disease, or unspecified chronic kidney disease: Secondary | ICD-10-CM | POA: Diagnosis not present

## 2012-10-08 DIAGNOSIS — N181 Chronic kidney disease, stage 1: Secondary | ICD-10-CM | POA: Diagnosis not present

## 2012-10-31 DIAGNOSIS — E1149 Type 2 diabetes mellitus with other diabetic neurological complication: Secondary | ICD-10-CM | POA: Diagnosis not present

## 2012-10-31 DIAGNOSIS — F17200 Nicotine dependence, unspecified, uncomplicated: Secondary | ICD-10-CM | POA: Diagnosis not present

## 2012-10-31 DIAGNOSIS — Z794 Long term (current) use of insulin: Secondary | ICD-10-CM | POA: Diagnosis not present

## 2012-10-31 DIAGNOSIS — G909 Disorder of the autonomic nervous system, unspecified: Secondary | ICD-10-CM | POA: Diagnosis not present

## 2012-11-12 DIAGNOSIS — I129 Hypertensive chronic kidney disease with stage 1 through stage 4 chronic kidney disease, or unspecified chronic kidney disease: Secondary | ICD-10-CM | POA: Diagnosis not present

## 2012-11-12 DIAGNOSIS — E1129 Type 2 diabetes mellitus with other diabetic kidney complication: Secondary | ICD-10-CM | POA: Diagnosis not present

## 2012-11-12 DIAGNOSIS — G894 Chronic pain syndrome: Secondary | ICD-10-CM | POA: Diagnosis not present

## 2012-11-12 DIAGNOSIS — N189 Chronic kidney disease, unspecified: Secondary | ICD-10-CM | POA: Diagnosis not present

## 2012-11-20 DIAGNOSIS — Z23 Encounter for immunization: Secondary | ICD-10-CM | POA: Diagnosis not present

## 2012-12-10 DIAGNOSIS — E1129 Type 2 diabetes mellitus with other diabetic kidney complication: Secondary | ICD-10-CM | POA: Diagnosis not present

## 2012-12-10 DIAGNOSIS — N189 Chronic kidney disease, unspecified: Secondary | ICD-10-CM | POA: Diagnosis not present

## 2012-12-10 DIAGNOSIS — G894 Chronic pain syndrome: Secondary | ICD-10-CM | POA: Diagnosis not present

## 2013-01-01 DIAGNOSIS — E1149 Type 2 diabetes mellitus with other diabetic neurological complication: Secondary | ICD-10-CM | POA: Diagnosis not present

## 2013-01-08 DIAGNOSIS — G909 Disorder of the autonomic nervous system, unspecified: Secondary | ICD-10-CM | POA: Diagnosis not present

## 2013-01-08 DIAGNOSIS — E1129 Type 2 diabetes mellitus with other diabetic kidney complication: Secondary | ICD-10-CM | POA: Diagnosis not present

## 2013-01-08 DIAGNOSIS — I10 Essential (primary) hypertension: Secondary | ICD-10-CM | POA: Diagnosis not present

## 2013-01-08 DIAGNOSIS — R809 Proteinuria, unspecified: Secondary | ICD-10-CM | POA: Diagnosis not present

## 2013-03-13 DIAGNOSIS — N189 Chronic kidney disease, unspecified: Secondary | ICD-10-CM | POA: Diagnosis not present

## 2013-03-13 DIAGNOSIS — N181 Chronic kidney disease, stage 1: Secondary | ICD-10-CM | POA: Diagnosis not present

## 2013-03-13 DIAGNOSIS — I129 Hypertensive chronic kidney disease with stage 1 through stage 4 chronic kidney disease, or unspecified chronic kidney disease: Secondary | ICD-10-CM | POA: Diagnosis not present

## 2013-03-13 DIAGNOSIS — E1129 Type 2 diabetes mellitus with other diabetic kidney complication: Secondary | ICD-10-CM | POA: Diagnosis not present

## 2013-04-10 DIAGNOSIS — N189 Chronic kidney disease, unspecified: Secondary | ICD-10-CM | POA: Diagnosis not present

## 2013-04-10 DIAGNOSIS — E1129 Type 2 diabetes mellitus with other diabetic kidney complication: Secondary | ICD-10-CM | POA: Diagnosis not present

## 2013-05-08 DIAGNOSIS — G894 Chronic pain syndrome: Secondary | ICD-10-CM | POA: Diagnosis not present

## 2013-05-08 DIAGNOSIS — M729 Fibroblastic disorder, unspecified: Secondary | ICD-10-CM | POA: Diagnosis not present

## 2013-05-08 DIAGNOSIS — I517 Cardiomegaly: Secondary | ICD-10-CM | POA: Diagnosis not present

## 2013-05-08 DIAGNOSIS — IMO0001 Reserved for inherently not codable concepts without codable children: Secondary | ICD-10-CM | POA: Diagnosis not present

## 2013-06-10 DIAGNOSIS — F341 Dysthymic disorder: Secondary | ICD-10-CM | POA: Diagnosis not present

## 2013-06-10 DIAGNOSIS — E1129 Type 2 diabetes mellitus with other diabetic kidney complication: Secondary | ICD-10-CM | POA: Diagnosis not present

## 2013-06-10 DIAGNOSIS — E1165 Type 2 diabetes mellitus with hyperglycemia: Secondary | ICD-10-CM | POA: Diagnosis not present

## 2013-06-10 DIAGNOSIS — M7730 Calcaneal spur, unspecified foot: Secondary | ICD-10-CM | POA: Diagnosis not present

## 2013-06-10 DIAGNOSIS — G894 Chronic pain syndrome: Secondary | ICD-10-CM | POA: Diagnosis not present

## 2013-07-10 DIAGNOSIS — G894 Chronic pain syndrome: Secondary | ICD-10-CM | POA: Diagnosis not present

## 2013-07-10 DIAGNOSIS — M79609 Pain in unspecified limb: Secondary | ICD-10-CM | POA: Diagnosis not present

## 2013-07-10 DIAGNOSIS — E785 Hyperlipidemia, unspecified: Secondary | ICD-10-CM | POA: Diagnosis not present

## 2013-07-10 DIAGNOSIS — I517 Cardiomegaly: Secondary | ICD-10-CM | POA: Diagnosis not present

## 2013-08-08 DIAGNOSIS — E119 Type 2 diabetes mellitus without complications: Secondary | ICD-10-CM | POA: Diagnosis not present

## 2013-08-08 DIAGNOSIS — B009 Herpesviral infection, unspecified: Secondary | ICD-10-CM | POA: Diagnosis not present

## 2013-08-22 DIAGNOSIS — M6281 Muscle weakness (generalized): Secondary | ICD-10-CM | POA: Diagnosis not present

## 2013-08-22 DIAGNOSIS — L0390 Cellulitis, unspecified: Secondary | ICD-10-CM | POA: Diagnosis not present

## 2013-08-22 DIAGNOSIS — I517 Cardiomegaly: Secondary | ICD-10-CM | POA: Diagnosis not present

## 2013-08-22 DIAGNOSIS — L0291 Cutaneous abscess, unspecified: Secondary | ICD-10-CM | POA: Diagnosis not present

## 2013-08-22 DIAGNOSIS — E119 Type 2 diabetes mellitus without complications: Secondary | ICD-10-CM | POA: Diagnosis not present

## 2013-09-05 DIAGNOSIS — E119 Type 2 diabetes mellitus without complications: Secondary | ICD-10-CM | POA: Diagnosis not present

## 2013-09-05 DIAGNOSIS — L03211 Cellulitis of face: Secondary | ICD-10-CM | POA: Diagnosis not present

## 2013-09-05 DIAGNOSIS — L0201 Cutaneous abscess of face: Secondary | ICD-10-CM | POA: Diagnosis not present

## 2013-10-06 DIAGNOSIS — G894 Chronic pain syndrome: Secondary | ICD-10-CM | POA: Diagnosis not present

## 2013-10-06 DIAGNOSIS — I517 Cardiomegaly: Secondary | ICD-10-CM | POA: Diagnosis not present

## 2013-10-06 DIAGNOSIS — IMO0001 Reserved for inherently not codable concepts without codable children: Secondary | ICD-10-CM | POA: Diagnosis not present

## 2013-10-06 DIAGNOSIS — I10 Essential (primary) hypertension: Secondary | ICD-10-CM | POA: Diagnosis not present

## 2013-11-06 DIAGNOSIS — G894 Chronic pain syndrome: Secondary | ICD-10-CM | POA: Diagnosis not present

## 2013-11-06 DIAGNOSIS — N958 Other specified menopausal and perimenopausal disorders: Secondary | ICD-10-CM | POA: Diagnosis not present

## 2013-11-06 DIAGNOSIS — IMO0001 Reserved for inherently not codable concepts without codable children: Secondary | ICD-10-CM | POA: Diagnosis not present

## 2013-11-06 DIAGNOSIS — I517 Cardiomegaly: Secondary | ICD-10-CM | POA: Diagnosis not present

## 2013-12-08 DIAGNOSIS — M5126 Other intervertebral disc displacement, lumbar region: Secondary | ICD-10-CM | POA: Diagnosis not present

## 2013-12-08 DIAGNOSIS — M797 Fibromyalgia: Secondary | ICD-10-CM | POA: Diagnosis not present

## 2014-01-08 DIAGNOSIS — R634 Abnormal weight loss: Secondary | ICD-10-CM | POA: Diagnosis not present

## 2014-01-08 DIAGNOSIS — E784 Other hyperlipidemia: Secondary | ICD-10-CM | POA: Diagnosis not present

## 2014-01-08 DIAGNOSIS — M5430 Sciatica, unspecified side: Secondary | ICD-10-CM | POA: Diagnosis not present

## 2014-01-08 DIAGNOSIS — E119 Type 2 diabetes mellitus without complications: Secondary | ICD-10-CM | POA: Diagnosis not present

## 2014-01-08 DIAGNOSIS — M545 Low back pain: Secondary | ICD-10-CM | POA: Diagnosis not present

## 2014-01-08 DIAGNOSIS — G894 Chronic pain syndrome: Secondary | ICD-10-CM | POA: Diagnosis not present

## 2014-02-05 ENCOUNTER — Inpatient Hospital Stay: Payer: Self-pay | Admitting: Surgery

## 2014-02-05 DIAGNOSIS — G629 Polyneuropathy, unspecified: Secondary | ICD-10-CM | POA: Diagnosis not present

## 2014-02-05 DIAGNOSIS — L02415 Cutaneous abscess of right lower limb: Secondary | ICD-10-CM | POA: Diagnosis not present

## 2014-02-05 DIAGNOSIS — E11649 Type 2 diabetes mellitus with hypoglycemia without coma: Secondary | ICD-10-CM | POA: Diagnosis present

## 2014-02-05 DIAGNOSIS — Z79899 Other long term (current) drug therapy: Secondary | ICD-10-CM | POA: Diagnosis not present

## 2014-02-05 DIAGNOSIS — M797 Fibromyalgia: Secondary | ICD-10-CM | POA: Diagnosis present

## 2014-02-05 DIAGNOSIS — I10 Essential (primary) hypertension: Secondary | ICD-10-CM | POA: Diagnosis not present

## 2014-02-05 DIAGNOSIS — E440 Moderate protein-calorie malnutrition: Secondary | ICD-10-CM | POA: Diagnosis not present

## 2014-02-05 DIAGNOSIS — L0231 Cutaneous abscess of buttock: Secondary | ICD-10-CM | POA: Diagnosis not present

## 2014-02-05 DIAGNOSIS — M7100 Abscess of bursa, unspecified site: Secondary | ICD-10-CM | POA: Diagnosis not present

## 2014-02-05 DIAGNOSIS — G8929 Other chronic pain: Secondary | ICD-10-CM | POA: Diagnosis present

## 2014-02-05 DIAGNOSIS — L03115 Cellulitis of right lower limb: Secondary | ICD-10-CM | POA: Diagnosis not present

## 2014-02-05 DIAGNOSIS — Z888 Allergy status to other drugs, medicaments and biological substances status: Secondary | ICD-10-CM | POA: Diagnosis not present

## 2014-02-05 DIAGNOSIS — Z794 Long term (current) use of insulin: Secondary | ICD-10-CM | POA: Diagnosis not present

## 2014-02-05 DIAGNOSIS — E1142 Type 2 diabetes mellitus with diabetic polyneuropathy: Secondary | ICD-10-CM | POA: Diagnosis not present

## 2014-02-05 DIAGNOSIS — Z881 Allergy status to other antibiotic agents status: Secondary | ICD-10-CM | POA: Diagnosis not present

## 2014-02-05 DIAGNOSIS — M1990 Unspecified osteoarthritis, unspecified site: Secondary | ICD-10-CM | POA: Diagnosis not present

## 2014-02-05 DIAGNOSIS — F17210 Nicotine dependence, cigarettes, uncomplicated: Secondary | ICD-10-CM | POA: Diagnosis present

## 2014-02-05 DIAGNOSIS — Z79891 Long term (current) use of opiate analgesic: Secondary | ICD-10-CM | POA: Diagnosis not present

## 2014-02-05 DIAGNOSIS — Z8611 Personal history of tuberculosis: Secondary | ICD-10-CM | POA: Diagnosis not present

## 2014-02-05 DIAGNOSIS — L03317 Cellulitis of buttock: Secondary | ICD-10-CM | POA: Diagnosis not present

## 2014-02-05 DIAGNOSIS — N390 Urinary tract infection, site not specified: Secondary | ICD-10-CM | POA: Diagnosis not present

## 2014-02-05 DIAGNOSIS — J449 Chronic obstructive pulmonary disease, unspecified: Secondary | ICD-10-CM | POA: Diagnosis present

## 2014-02-05 DIAGNOSIS — Z6821 Body mass index (BMI) 21.0-21.9, adult: Secondary | ICD-10-CM | POA: Diagnosis not present

## 2014-02-05 LAB — CBC WITH DIFFERENTIAL/PLATELET
BASOS PCT: 1 %
Basophil #: 0.2 10*3/uL — ABNORMAL HIGH (ref 0.0–0.1)
EOS PCT: 1.3 %
Eosinophil #: 0.2 10*3/uL (ref 0.0–0.7)
HCT: 43.8 % (ref 35.0–47.0)
HGB: 14.6 g/dL (ref 12.0–16.0)
LYMPHS ABS: 2.2 10*3/uL (ref 1.0–3.6)
Lymphocyte %: 12.4 %
MCH: 30.7 pg (ref 26.0–34.0)
MCHC: 33.4 g/dL (ref 32.0–36.0)
MCV: 92 fL (ref 80–100)
Monocyte #: 0.8 x10 3/mm (ref 0.2–0.9)
Monocyte %: 4.4 %
NEUTROS ABS: 14.1 10*3/uL — AB (ref 1.4–6.5)
Neutrophil %: 80.9 %
Platelet: 356 10*3/uL (ref 150–440)
RBC: 4.77 10*6/uL (ref 3.80–5.20)
RDW: 12.8 % (ref 11.5–14.5)
WBC: 17.5 10*3/uL — ABNORMAL HIGH (ref 3.6–11.0)

## 2014-02-05 LAB — BASIC METABOLIC PANEL
Anion Gap: 9 (ref 7–16)
BUN: 12 mg/dL (ref 7–18)
CO2: 29 mmol/L (ref 21–32)
Calcium, Total: 8.9 mg/dL (ref 8.5–10.1)
Chloride: 97 mmol/L — ABNORMAL LOW (ref 98–107)
Creatinine: 0.79 mg/dL (ref 0.60–1.30)
GLUCOSE: 229 mg/dL — AB (ref 65–99)
Osmolality: 277 (ref 275–301)
Potassium: 3.8 mmol/L (ref 3.5–5.1)
Sodium: 135 mmol/L — ABNORMAL LOW (ref 136–145)

## 2014-02-07 LAB — BASIC METABOLIC PANEL
Anion Gap: 7 (ref 7–16)
BUN: 8 mg/dL (ref 7–18)
CALCIUM: 8.7 mg/dL (ref 8.5–10.1)
CO2: 32 mmol/L (ref 21–32)
CREATININE: 0.97 mg/dL (ref 0.60–1.30)
Chloride: 99 mmol/L (ref 98–107)
EGFR (African American): >60
EGFR (Non-African Amer.): >60
Glucose: 232 mg/dL — ABNORMAL HIGH (ref 65–99)
OSMOLALITY: 281 (ref 275–301)
POTASSIUM: 4 mmol/L (ref 3.5–5.1)
SODIUM: 138 mmol/L (ref 136–145)

## 2014-02-07 LAB — URINALYSIS, COMPLETE
BACTERIA: NONE SEEN
Bilirubin,UR: NEGATIVE
Glucose,UR: 50 mg/dL (ref 0–75)
Ketone: NEGATIVE
Leukocyte Esterase: NEGATIVE
Nitrite: NEGATIVE
PH: 7 (ref 4.5–8.0)
Protein: 30
RBC,UR: 4 /HPF (ref 0–5)
Specific Gravity: 1.005 (ref 1.003–1.030)
WBC UR: 6 /HPF (ref 0–5)

## 2014-02-07 LAB — VANCOMYCIN, TROUGH: Vancomycin, Trough: 11 ug/mL (ref 10–20)

## 2014-02-09 DIAGNOSIS — M6281 Muscle weakness (generalized): Secondary | ICD-10-CM | POA: Diagnosis not present

## 2014-02-09 DIAGNOSIS — L0231 Cutaneous abscess of buttock: Secondary | ICD-10-CM | POA: Diagnosis not present

## 2014-02-09 DIAGNOSIS — Z87891 Personal history of nicotine dependence: Secondary | ICD-10-CM | POA: Diagnosis not present

## 2014-02-10 LAB — CULTURE, BLOOD (SINGLE)

## 2014-02-10 LAB — WOUND CULTURE

## 2014-03-17 DIAGNOSIS — Z1389 Encounter for screening for other disorder: Secondary | ICD-10-CM | POA: Diagnosis not present

## 2014-03-17 DIAGNOSIS — K611 Rectal abscess: Secondary | ICD-10-CM | POA: Diagnosis not present

## 2014-03-17 DIAGNOSIS — E119 Type 2 diabetes mellitus without complications: Secondary | ICD-10-CM | POA: Diagnosis not present

## 2014-03-18 DIAGNOSIS — Z23 Encounter for immunization: Secondary | ICD-10-CM | POA: Diagnosis not present

## 2014-04-17 DIAGNOSIS — G894 Chronic pain syndrome: Secondary | ICD-10-CM | POA: Diagnosis not present

## 2014-04-17 DIAGNOSIS — M797 Fibromyalgia: Secondary | ICD-10-CM | POA: Diagnosis not present

## 2014-04-17 DIAGNOSIS — I10 Essential (primary) hypertension: Secondary | ICD-10-CM | POA: Diagnosis not present

## 2014-04-17 DIAGNOSIS — N393 Stress incontinence (female) (male): Secondary | ICD-10-CM | POA: Diagnosis not present

## 2014-05-15 DIAGNOSIS — M797 Fibromyalgia: Secondary | ICD-10-CM | POA: Diagnosis not present

## 2014-05-15 DIAGNOSIS — E119 Type 2 diabetes mellitus without complications: Secondary | ICD-10-CM | POA: Diagnosis not present

## 2014-05-15 DIAGNOSIS — F17200 Nicotine dependence, unspecified, uncomplicated: Secondary | ICD-10-CM | POA: Diagnosis not present

## 2014-05-15 DIAGNOSIS — N958 Other specified menopausal and perimenopausal disorders: Secondary | ICD-10-CM | POA: Diagnosis not present

## 2014-05-15 DIAGNOSIS — R5382 Chronic fatigue, unspecified: Secondary | ICD-10-CM | POA: Diagnosis not present

## 2014-06-15 DIAGNOSIS — M5440 Lumbago with sciatica, unspecified side: Secondary | ICD-10-CM | POA: Diagnosis not present

## 2014-06-15 DIAGNOSIS — E119 Type 2 diabetes mellitus without complications: Secondary | ICD-10-CM | POA: Diagnosis not present

## 2014-06-15 DIAGNOSIS — M797 Fibromyalgia: Secondary | ICD-10-CM | POA: Diagnosis not present

## 2014-06-16 NOTE — H&P (Signed)
PATIENT NAME:  Colleen Peters, Colleen Peters MR#:  [id number] DATE OF BIRTH:  Oct 07, 1960  DATE OF ADMISSION:  10/12/2011  PRIMARY CARE PHYSICIAN: Dr. Cletis Athens. She also sees another physician at [facility].   CHIEF COMPLAINT: Right foot pain, redness, and tenderness x1 week.   HISTORY OF PRESENT ILLNESS: Ms. Colleen Peters is a 54 year old Caucasian female with history of diabetes mellitus on insulin. She was in her usual state of health until about a week ago when she had accidental trauma to her right foot at the swimming pool. She developed tenderness of the bottom of the big toe. However, after a few days she developed increased tenderness, swelling, redness, and over the last two days the redness is extending from the right foot over the dorsum extending up above the ankle area. She does not give straightforward if she had fever or chills but reports that she felt cold.   REVIEW OF SYSTEMS: CONSTITUTIONAL: Denies fever but she had maybe some chills. No fatigue. No night sweats. EYES: No blurring of vision. No double vision. ENT: No hearing impairment. No sore throat. No dysphagia. CARDIOVASCULAR: No chest pain. No shortness of breath. No syncope. She has history of occasional chest pain for which she takes nitroglycerin. RESPIRATORY: No shortness of breath. No cough. No sputum production. GASTROINTESTINAL: No abdominal pain. No vomiting. No diarrhea. Occasionally she may have diarrhea but mostly she is constipated. GENITOURINARY: No dysuria. No frequency of urination. She is on treatment with bladder stimulator. MUSCULOSKELETAL: No joint swelling but she has chronic pains in her joints and muscles. No muscular tenderness other than generalized aching pains in association with her fibromyalgia. No swelling. INTEGUMENTARY: She has skin rash and cellulitis extending from the right big toe to above the ankle area. No ulcers. There is, however, some swelling and hematoma like picture at the bottom of the right big toe.  NEUROLOGY: No focal weakness. No seizure activity. No headache. PSYCHIATRY: History of anxiety but no depression. ENDOCRINE: No polyuria or polydipsia. No heat or cold intolerance.   PAST MEDICAL HISTORY:  1. Diabetes mellitus type II, on insulin. 2. Fibromyalgia.  3. Chronic pain syndrome. 4. History of urinary frequency. She underwent bladder pacemaker stimulator.  5. History of chronic constipation. 6. Diverticulosis.   PAST SURGICAL HISTORY: Pacemaker insertion for bladder.   SOCIAL HABITS: Chronic smoker, 2 packs per day since age of 14. She drinks alcohol only socially. No history of drug abuse.   SOCIAL HISTORY: She is married, living with her husband. She is unemployed and living on disability due to her fibromyalgia.   FAMILY HISTORY: Grandmother suffered from diabetes mellitus. Her mother has diabetes which is borderline along with lupus. Her father died from metastatic cancer.   ADMISSION MEDICATIONS:  1. Novolin insulin 70 units 50 units twice a day. 2. Onglyza once a day. Dose was not specified.  3. Metoprolol 25 mg once a day.  4. Magnesium citrate 10 ounces every few days.  5. Vitamin B12 500 mcg once a day p.o.  6. Lisinopril 20 mg once a day.  7. Hydrochlorothiazide 25 mg a day. 8. Gemfibrozil 600 mg twice a day.  9. Gabapentin 300 mg twice a day.  10. Flexeril 10 mg 3 times a day. 11. Diazepam 5 mg 3 to 4 times a day. 12. Cymbalta 60 mg 1 to 2 capsules once a day. 13. Actos 30 mg a day.   ALLERGIES: Bactrim causes itching. Shellfish causes itching. Relafen causes GI problems.   PHYSICAL EXAMINATION:  VITAL SIGNS: Blood pressure 100/60, respiratory rate 20, pulse 92, temperature 96.2.   GENERAL APPEARANCE: Middle-aged female laying in bed in no acute distress.   HEAD: No pallor. No icterus. No cyanosis.   EARS, NOSE, AND THROAT: Hearing was normal. Nasal mucosa, lips, tongue were normal.   EYES: Normal eyelids and conjunctivae. Pupils about 5 mm, equal  and reactive to light.   NECK: Supple. Trachea at midline. No thyromegaly. No cervical lymphadenopathy. No masses.   HEART: Normal S1, S2. No S4. No murmur. No gallop. No carotid bruits.   RESPIRATORY: Normal breathing pattern without use of accessory muscles. No rales. No wheezing.   ABDOMEN: Soft without tenderness. No hepatosplenomegaly. No masses. No hernias.   SKIN: Cellulitis extending from the right big toe along the dorsum of the right foot and with streaks of red lines above the ankle. At the bottom of the right big toe there is some swelling and bluish discoloration hematoma like picture and some whitish discoloration beneath the skin. Area is tender.   MUSCULOSKELETAL: No joint swelling. No clubbing.   NEUROLOGIC: Cranial nerves II through XII are intact. No focal motor deficit.   PSYCHIATRIC: The patient is alert and oriented x3. Mood and affect were normal.   LABORATORY, DIAGNOSTIC, AND RADIOLOGICAL DATA: Blood sugar 303, BUN 25, creatinine 1.09, sodium 136, potassium 4.1. Her liver function tests were normal with slightly low albumin at 3.1. CBC showed white count of 14,000, hemoglobin 14, hematocrit 42, platelet count 244. Prothrombin time 11. INR 0.8. APTT 26. Urinalysis showed unremarkable findings except for glucose of 150.   ASSESSMENT:  1. Right foot cellulitis.  2. Diabetes mellitus type II, on insulin, uncontrolled.  3. Fibromyalgia.  4. Chronic pain syndrome.  5. Atonic bladder, status post pacemaker stimulator.    PLAN:  1. Will admit the patient to the medical floor. 2. Start IV antibiotics to cover for Staphylococcus, gram-positives, and also for gram negatives, especially Pseudomonas aeruginosa since the injury was at the swimming pool site.  3. Accu-Cheks and insulin sliding scale.  4. Continue medications as listed above.  5. Pain control.   TIME SPENT EVALUATING THIS PATIENT: More than 50 minutes.   ____________________________ Clovis Pu. Lenore Manner,  MD amd:drc D: 10/12/2011 02:48:37 ET T: 10/12/2011 06:55:19 ET JOB#: [id number]  cc: Clovis Pu. Lenore Manner, MD, <Dictator> Cletis Athens, MD Mike Craze Irven Coe MD ELECTRONICALLY SIGNED 10/14/2011 22:20

## 2014-06-16 NOTE — Consult Note (Signed)
PATIENT NAME:  Colleen Peters, Colleen Peters MR#:  [id number] DATE OF BIRTH:  1960/09/23  DATE OF CONSULTATION:  10/12/2011  REFERRING PHYSICIAN:   CONSULTING PHYSICIAN:  Gerrit Heck. Dory Demont, DPM  HISTORY OF PRESENT ILLNESS: Patient was admitted through the ER today with cellulitis, redness to her left great toe with lymphangitis spreading proximally. States she developed a wound on her right great toe last week while at a water park and did not seek any help with it until came in the hospital today. She noticed a gradual increase in pain, discomfort. She pressed on it yesterday to try to get some fluid out and it became more painful more tender for her. She is a patient of Dr. Lavera Guise. States she is a diabetic but does not control it very well. States her last hemoglobin A1c was over 11. This was done probably over a month ago.   PAST MEDICAL HISTORY:  1. Diabetes, type 2 but is on insulin. 2. Fibromyalgia. 3. Chronic pain syndrome.  4. History of urinary frequency. 5. History of chronic GI constipation and some diverticulosis.    PAST SURGICAL HISTORY: Pacemaker insertion for assistance with bladder function.   SOCIAL HISTORY: Chronic smoking 2 packs per day since age of 26. EtOH only socially. Denies any recent use of recreational drugs. She is married and is disabled.   FAMILY HISTORY: Grandmother with diabetes and a mother with lupus and diabetes. Father is deceased from metastatic cancer.   CURRENT MEDICATIONS ON ADMISSION:  1. Novolin 70/50 twice a day. 2. Onglyza , uncertain dose.  3. Metoprolol 25 mg daily. 4. Lisinopril 20 mg daily.  5. HCTZ 25 mg daily. 6. Gemfibrozil 600 mg b.i.d.  7. Gabapentin 300 mg b.i.d.  8. Flexeril 10 mg t.i.d.  9. Diazepam 5 mg 3 to 4 times daily. 10. Cymbalta 60 mg 1 to 2 capsules daily.  11. Actos 30 mg daily.   ALLERGIES: Bactrim which causes itching, Relafen causes some GI disturbance and shellfish causes itching.   PHYSICAL EXAMINATION: GENERAL: She  is alert, well oriented. She is with her husband at present.   VITAL SIGNS: Currently her temperature orally 98.3 pulse 87, respirations 20, blood pressure 139/83, pulse oximetry 96.   LOWER EXTREMITY EXAM: Patient has vasculature DP pulses are palpable bilaterally. She has got a good pulse to both feet. There is a little bit of cyanosis to both toes. She is sitting, however, with no cover her legs. ORTHO: No gross deformities. Structurally she has good range of motion <<to the stjs, mtjs, and theMISSING TEXT>>, metatarsal, metatarsophalangeal joints. Right great toe is too painful to really move around very much.   INTEGUMENT: There is swelling to the plantar aspect of the great toe with redness around the digit itself. There is some lymphadenopathy that is spreading proximally up the medial onto the dorsal aspect of the first MTP joint progressing proximally on the top and medial side of the foot. There is tenderness to palpation in the region. There is a small area where there is drainage trying to come from that area and obviously some infection in the region. Debridement superficially yields only a mild amount of purulent drainage but she was not comfortable enough for me to try to express any fluid. I did get a culture of this drainage area and will send that for evaluation. She has not had a wound culture done yet.   CLINICAL IMPRESSION: Cellulitis right foot great toe area secondary to #1 wound to that area last  week that was not treated professionally and severe uncontrolled diabetes.   TREATMENT PLAN: Culture as noted. Put a wet-to-dry dressing on the area. I am going to go ahead and order an MRI on her today. I think that will be helpful seeing as to what depth the fluid level may be, make sure there is no bone involvement, see if there is any kind of foreign body in the wound area itself that would be radiolucent. Her other x-rays showed no particular abnormalities or breakdown. No evidence of  radiopaque foreign body in the region. There is a possibility she does have an accessory bone at the IPJ level plantarly which may be creating some problems for her also. See if we can get the MRI done today and I will try to get her set up for surgery sometime tomorrow.  ____________________________ Gerrit Heck Bela Bonaparte, DPM mgt:cms D: 10/12/2011 14:45:51 ET T: 10/12/2011 15:38:46 ET JOB#: [id number]  cc: Gerrit Heck Nahuel Wilbert, DPM, <Dictator>  Perry Mount MD ELECTRONICALLY SIGNED 11/14/2011 12:45

## 2014-06-16 NOTE — Op Note (Signed)
PATIENT NAME:  Colleen Peters, Colleen Peters MR#:  [id number] DATE OF BIRTH:  25-Aug-1960  DATE OF PROCEDURE:  10/13/2011  PREOPERATIVE DIAGNOSIS: Abscess with diabetic ulceration of right great toe.   POSTOPERATIVE DIAGNOSIS: Abscess with diabetic ulceration of right great toe.   PROCEDURE: Incision and drainage of abscess cellulitis right great toe.   SURGEON: Gerrit Heck. Tanika Bracco, DPM.   ASSISTANT: None.   ANESTHESIA: General.   ANESTHESIOLOGIST: Dr. Andree Elk.   ESTIMATED BLOOD LOSS: Approximately 20 to 25 mL.   HISTORY OF PRESENT ILLNESS: The patient stepped on something and hit her toe while in a water park last week sometime. She had some continued pain and swelling with it, came to the ER and was admitted through the Emergency Room yesterday. Observation showed cellulitis to the right foot which worsened between yesterday and today with an ulcerative change in the plantar aspect of the toe. This at the IPJ level of the hallux.   DESCRIPTION OF PROCEDURE: The patient was brought to the Operating Room and placed on the Operating Room table in the supine position. At this point, after general anesthesia was achieved, an ankle block was performed by me with 0.5% Marcaine plain. 10 mL were used. The patient was then prepped and draped in the usual sterile manner. Attention was then directed to the plantar aspect of the right foot where a 2 cm linear incision was made distal to the ulcer, through the ulcer and the proximal. This was opened up with blunt dissection to check for two things, #1 to release any purulence and #2, to check for any potential foreign body in the region. Neither was encountered. Tissue appeared to be fairly clean and healthy without any evidence of significant infection. The area was probed with a blunt hemostat to see if there were any sinus tracts and none were indicated or noted. No evidence of foreign body. There was no penetration of foreign body into deeper tissues. The joint  capsule appear to be intact and not swollen. There was a presence, however, of an accessory sesamoid bone at the sub-IPJ level of the right hallux, which may have been contributing to the continued opening to the region. Because of the extensive cellulitis on the dorsum of the foot, I felt it prudent to make a small incision on the dorsum centrally in that cellulitic area. This was incised approximately a 1 cm segment deepened with a blunt dissection. No purulence was noted in that region at the time. I then irrigated both regions with pulse lavage, used 1000 mL on each incision. I cauterized the blood vessel on the dorsal aspect, sutured it with 3-0 nylon simple interrupted sutures. The plantar incision I closed distally and proximally with one simple interrupted suture and packed the plantar wound so that any drainage would occur appropriately from the region to make sure she did not have any type of bacteria trapped in the region. I will remove that packing tomorrow and see how it does to  see if we need to repack it. The area was then dressed with sterile compressive dressing consisting of Xeroform gauze, 4 x 4's, Kling and Kerlix. The patient appeared to tolerate the procedure and anesthesia well and left the Operating Room for the recovery room with vital signs stable and neurovascular status intact.  ____________________________ Gerrit Heck. Terren Haberle, DPM mgt:ap D: 10/13/2011 11:48:54 ET T: 10/13/2011 12:41:30 ET JOB#: [id number]  cc: Gerrit Heck Jenet Durio, DPM, <Dictator> Perry Mount MD ELECTRONICALLY SIGNED 11/14/2011 12:46

## 2014-06-16 NOTE — Discharge Summary (Signed)
PATIENT NAME:  Colleen Peters, Colleen Peters MR#:  [id number] DATE OF BIRTH:  04/13/1960  DATE OF ADMISSION:  10/12/2011 DATE OF DISCHARGE:  10/16/2011  ADMITTING DIAGNOSIS: Right foot pain, redness, tenderness for a week.   DISCHARGE DIAGNOSES:  1. Right foot pain due to abscess with diabetic ulceration of the right great toe as well as surrounding cellulitis, status post incision and drainage.   2. Diabetes, type 2, which is very poorly controlled. Her insulin has been adjusted.  3. Fibromyalgia.  4. Chronic pain syndrome.  5. History of urinary frequency, status post bladder pacemaker stimulator.  6. History of chronic constipation.  7. History of diverticulosis.  8. Status post pacemaker insertion for bladder.  PERTINENT LABORATORY, DIAGNOSTIC AND RADIOLOGICAL DATA: Admitting glucose 303, BUN 25, creatinine 1.09, sodium 136, potassium 4.1, chloride 98, CO2 31. Hemoglobin A1c was 11.4. LFTs were normal except albumin of 3.1. WBC 14.4, hemoglobin 14.7, platelet count was 244. Urine cultures mixed results. Blood cultures no growth. Wound cultures intraoperatively showed methicillin-sensitive Staphylococcus aureus as well as Streptococcus agalactia.   HOSPITAL COURSE: Please refer to History and Physical done by the admitting physician. The patient is a 54 year old Caucasian female with history of diabetes. The patient was in her usual state of health until one week prior when she had accidental trauma to her right foot at the swimming pool. The patient developed tenderness of the bottom of her big toe and subsequently noticed swelling, redness extending up from her foot and extending up to the ankle area. The patient came to the ED and was noted to have cellulitis and also was noticed to have an ulcer. Due to these findings, a consult was obtained by Dr. Elvina Mattes, who took the patient to operating room, and the patient underwent incision and drainage of the abscess and cellulitis of the right great toe. The  patient tolerated the procedure without any complications. She was followed by Dr. Elvina Mattes, and currently the cellulitis is significantly improved. Her culture results are back. I have discussed them with Dr. Elvina Mattes, who states that the patient can be discharged.   DISCHARGE MEDICATIONS:  1. Percocet 10/325, 1 tab p.o. every 6 hours p.r.n. pain. 2. Augmentin 875/125, 1 tab p.o. every 12 hours x10 days.  3. Diazepam 5 mg, 1 tab p.o. every 8 hours p.r.n. as needed.  4. Dicloxacillin 500 mg, 1 tab p.o. every 6 hours x10 days. 5. Gemfibrozil 600 mg, 1 tab p.o. b.i.d.  6. Insulin 70/30 55 units b.i.d.  7. Metoprolol tartrate 25 mg, 1 tab p.o. daily. 8. Onglyza 1 tab p.o. daily.   9. Magnesium citrate 10 ounces every few days.  10. Gabapentin 300 mg t.i.d.  11. Flexeril 10 mg t.i.d.  12. Cymbalta 60 mg, 1 tablet daily.  13. Actos 30 mg daily.   DISCHARGE DRESSING CARE: Wet-to-dry dressing to the right foot wound once a day. Pad the dressing well.   HOME OXYGEN: None.   DIET: Low sodium, carbohydrate controlled diet.   ACTIVITY: Try not to bear weight on the right leg as much as possible.   FOLLOWUP:  1. Follow up with Dr. Elvina Mattes, Podiatry, next week.  2. Follow up with Dr. Lavera Guise in 1 to 2 weeks.   REFERRAL: Home Health.   TIME SPENT: 45 minutes spent.  ____________________________ Lafonda Mosses. Posey Pronto, MD shp:cbb D: 10/16/2011 12:38:00 ET T: 10/17/2011 11:12:28 ET JOB#: [id number] cc: Cletis Athens, MD Alric Seton MD ELECTRONICALLY SIGNED 10/20/2011 16:01

## 2014-06-20 NOTE — Op Note (Signed)
PATIENT NAME:  Colleen Peters, BORAWSKI MR#:  [id number] DATE OF BIRTH:  December 20, 1960  DATE OF PROCEDURE:  02/05/2014  PREOPERATIVE DIAGNOSIS: Right buttock abscess.   POSTOPERATIVE DIAGNOSIS: Right buttock abscess.   SURGEON: Consuela Mimes, MD   ANESTHESIA: General.   OPERATION PERFORMED: Incision and drainage and debridement of necrotic subcutaneous tissue and fascia from right buttock abscess.   PROCEDURE IN DETAIL: The patient was placed in the lithotomy position and prepped and draped in the usual sterile fashion. A linear incision was made over top of the abscess and carried down through the skin and a little bit of subcutaneous tissue into the abscess cavity. There was significant pus insinuating amidst the ischiorectal fat as well as below the fascia into the musculature, but this was not a necrotizing soft tissue infection. I did have to debride some necrotic subcutaneous tissue and fascia in order to drain all the pus, however. Hemostasis was achieved with the electrocautery. At the conclusion of the drainage procedure the wound was packed with a saline-soaked 2 inch Kling, which essentially filled the wound, mesh panties, and 4 x 4's were placed over top of the incision completing the procedure. The patient tolerated the procedure well. There were no complications.    ____________________________ Consuela Mimes, MD wfm:bm D: 02/05/2014 20:38:45 ET T: 02/06/2014 05:06:14 ET JOB#: [id number]  cc: Consuela Mimes, MD, <Dictator> Consuela Mimes MD ELECTRONICALLY SIGNED 02/09/2014 9:15

## 2014-06-20 NOTE — Consult Note (Signed)
Brief Consult Note: Diagnosis: DIABETES MELLITUS, fibromyalgia, arthritis, right gluteal abscess status post drainage.   Patient was seen by consultant.   Consult note dictated.   Orders entered.   Comments: 53y/oF with Insulin Dependent Diabetes Mellitus, High Blood Pressure (Hypertension), fibromyalgia, chronic pain admitted for right gluteal abscess for 6days status post I&D in OR medical consult for medical management  * Right gluteal abscess- status post I&D by surgical team, packing and rdessing in place, cultures sent for continue ABX with unasyn-? might need Methacillin Resistant Staph aureus coverage add vanc  * DM- continue insulin, actos and onglyza, Sliding Scale Insulin  * chronic pain,neuropathy adn fibromyalgia- follows with pain clinic, continue home meds  * HTN- continue home meds  * Tobacco use diosrder- counselled for 11min, started nicotine patch.  Electronic Signatures: Gladstone Lighter (MD)  (Signed 10-Dec-15 21:45)  Authored: Brief Consult Note   Last Updated: 10-Dec-15 21:45 by Gladstone Lighter (MD)

## 2014-06-20 NOTE — H&P (Signed)
History of Present Illness 18 yof with a 6 day h/o pain in the right buttock region. Started as a small boil, but has grown and now th epain is unbearable. No fever or chills. No GI or GU Sx. No spontaneous drainage.   Past Med/Surgical Hx:  Fibromyalgia:   Urinary Tract Infection:   Peripheral Neuropathy:   Degenerative Joint Disease:   Degenerative Disc Disease:   Arthritis:   Tuberculosis:   COPD:   DIABETES:   HTN:   pace maker for bladder:   Cholecystectomy:   Appendectomy:   Arm Surgery - Left:   Arm Surgery - Right:   Back Surgery:   Hysterectomy - Partial:   ALLERGIES:  Relafen: GI Distress  Bactrim: Itching, N/V/Diarrhea  Other- Explain in Comments Line: Itching  Shellfish: Itching, Swelling  Prednisone: Other  HOME MEDICATIONS: Medication Instructions Status  acetaminophen-oxyCODONE 325 mg-10 mg oral tablet 1 tab(s) orally every 6 hours, As Needed- for Pain  Active  amoxicillin-clavulanate 875 mg-125 mg oral tablet 1 tab(s) orally every 12 hours Active  dicloxacillin 500 mg oral capsule 1 cap(s) orally every 6 hours x 10days Active  metoprolol succinate 25 mg oral tablet, extended release 1 tab(s) orally once a day Active  diazepam 5 mg oral tablet 1 tab(s) orally every 8 hours, As needed, anxiety Active  gemfibrozil 600 mg oral tablet 1 tab(s) orally 2 times a day (before meals) Active  insulin isophane-insulin regular 70 units-30 units/mL human recombinant subcutaneous suspension 55  subcutaneous 2 times a day Active  Vitamin B12 500 mcg oral tablet 1 tab(s) orally once a day Active  lisinopril 20 mg oral tablet 1 tab(s) orally once a day Active  hydrochlorothiazide 25 mg oral tablet 1 tab(s) orally once a day Active  gabapentin 300 mg oral capsule 1 cap(s) orally 2 times a day Active  Flexeril 10 milligram(s) orally 3 times a day Active  Cymbalta 60 mg oral delayed release capsule 1-2 cap(s) orally once a day Active  Actos 30 mg oral tablet 1 tab(s) orally  once a day Active  magnesium citrate 10 ounces every few days Active  Onglyza 5 mg oral tablet 1 tab(s) orally once a day Active   Family and Social History:  Family History Non-Contributory   Social History positive  tobacco (Current within 1 year), negative ETOH, negative Illicit drugs, married, lives with husband, son, and grandchildren, on D/A, smokes 1 PPD, only has 1 or 2 ETOH drinks per year   Place of Living Home   Review of Systems:  Fever/Chills No   Cough No   Sputum No   Abdominal Pain No   Diarrhea No   Constipation No   Nausea/Vomiting No   SOB/DOE No   Chest Pain No   Dysuria No   Tolerating PT Yes   Tolerating Diet Yes  last meal yesterday   Physical Exam:  GEN well developed, well nourished, cachectic, disheveled   HEENT pink conjunctivae, PERRL, hearing intact to voice, moist oral mucosa, Oropharynx clear   NECK supple  trachea midline   RESP normal resp effort  clear BS  no use of accessory muscles   CARD regular rate  no murmur  no Rub   ABD denies tenderness   LYMPH negative neck   EXTR negative cyanosis/clubbing, negative edema   SKIN softball sized area of tender blanching erythema on right buttock, not involving the anal area   NEURO cranial nerves intact, negative tremor, follows commands, motor/sensory function intact  PSYCH alert, A+O to time, place, person, good insight    Assessment/Admission Diagnosis right buttock abscess   Plan IV ABx I & D right buttock abscess. Pt understands plan and agrees.   Electronic Signatures: Consuela Mimes (MD)  (Signed 10-Dec-15 18:55)  Authored: CHIEF COMPLAINT and HISTORY, PAST MEDICAL/SURGIAL HISTORY, ALLERGIES, HOME MEDICATIONS, FAMILY AND SOCIAL HISTORY, REVIEW OF SYSTEMS, PHYSICAL EXAM, ASSESSMENT AND PLAN   Last Updated: 10-Dec-15 18:55 by Consuela Mimes (MD)

## 2014-06-20 NOTE — Discharge Summary (Signed)
PATIENT NAME:  Colleen Peters, Colleen Peters MR#:  [id number] DATE OF BIRTH:  August 03, 1960  DATE OF ADMISSION:  02/05/2014 DATE OF DISCHARGE:  02/08/2014  PRINCIPAL DIAGNOSIS: Right buttock abscess.   OTHER DIAGNOSES: 1.  Fibromyalgia.  2.  Urinary tract infection. 3.  Peripheral neuropathy. 4.  Degenerative joint disease. 5.  Degenerative disk disease. 6.  Arthritis. 7.  History of tuberculosis. 8.  Chronic obstructive pulmonary disease. 9.  Diabetes mellitus. 10.  Hypertension. 11.  Status post pacemaker for bladder. 12.  Status post cholecystectomy.  13.  Status post appendectomy.  14.  Status post bilateral arm surgery.  15.  Status post partial hysterectomy. 16.  Status post back surgery.   PRINCIPAL PROCEDURE PERFORMED DURING THIS ADMISSION: On February 05, 2014, incision and drainage and debridement of necrotic subcutaneous tissue and fascia from right buttock abscess.   HOSPITAL COURSE: The patient did well postoperatively and was discharged home and asked to replace her dressing as necessary and told that she could shower. She was asked to make an appointment to see me in 1 to 2 weeks and to call the office in the interim for any problems.   ____________________________ Consuela Mimes, MD wfm:sb D: 02/13/2014 09:50:16 ET T: 02/13/2014 14:16:19 ET JOB#: [id number]  cc: Consuela Mimes, MD, <Dictator> Consuela Mimes MD ELECTRONICALLY SIGNED 02/14/2014 15:08

## 2014-06-22 LAB — SURGICAL PATHOLOGY

## 2014-06-24 NOTE — Consult Note (Signed)
PATIENT NAME:  Colleen Peters, Colleen Peters MR#:  [id number] DATE OF BIRTH:  30-Jun-1960  DATE OF CONSULTATION:  02/05/2014  ADMITTING PHYSICIAN: Consuela Mimes, MD  CONSULTING PHYSICIAN:  Gladstone Lighter, MD  PRIMARY CARE PHYSICIAN: Cletis Athens, MD  CONSULT REQUESTING PHYSICIAN: Consuela Mimes, MD  REASON FOR CONSULTATION: Medical management.   BRIEF HISTORY: Ms. Echeverria is a 54 year old Caucasian female with past medical history significant for insulin-dependent diabetes mellitus, hypertension, COPD not on home oxygen, ongoing smoking, arthritis, degenerative disk disease, fibromyalgia, chronic pain followed by pain management clinic, peripheral neuropathy presents to the hospital secondary to worsening right-sided pain going on for almost 6 days. The patient felt almost like a sore or boil kind of thing on the right gluteal region about 5-6 days ago. Denies any fever or chills but says the swelling has been getting worse and she was having worsening pain not relieved by any pain medications today so presented to the ER. She had a huge abscess for which she was taken to the OR and it drained under general anesthesia. She was seen in the PACU. Denies any fevers, but has been complaining of some nausea, vomiting worsening in the last couple of days. She has an elevated white count of 17.5. Her sugars seem like they are very well controlled at home. She had the abscess drained and cultures are pending at this time.   PAST MEDICAL HISTORY:  1.  Insulin-dependent diabetes mellitus.  2.  Hypertension.  3.  COPD not on any home oxygen.  4.  Fibromyalgia.  5.  Peripheral neuropathy.  6.  Degenerative disk disease.  7.  Degenerative joint disease.  8.  History of latent tuberculosis in the past.   PAST SURGICAL HISTORY:  1.  Bladder pacemaker.  2.  Cholecystectomy.  3.  Appendectomy.  4.  Bilateral arm surgery.  5.  Back surgery.  6.  Hysterectomy.   ALLERGIES TO MEDICATIONS: BACTRIM,  RELAFEN, SHELLFISH, PREDNISONE.   CURRENT HOME MEDICATIONS:  1.  Percocet 10/325 mg 1 tablet q.6 hours p.r.n.  2.  Metoprolol succinate 25 mg p.o. daily.  3.  Valium 5 mg q.8 hours p.r.n. for anxiety.  4.  Gemfibrozil 600 mg p.o. b.i.d.  5.  Insulin 70/30 at 55 units subcutaneous twice a day.  6.  Vitamin B12 at 500 mcg p.o. daily.  7.  Lisinopril 20 mg p.o. daily.  8.  HCTZ 25 mg p.o. daily.  9.  Flexeril 10 mg p.o. 3 times a day as needed.  10.  Cymbalta 60 mg p.o. daily.  11.  Actos 30 mg p.o. daily.  12.  Onglyza 5 mg p.o. daily.   SOCIAL HISTORY: Lives at home with her husband and son and grandchildren. Smokes about 1 pack per day. Occasional alcohol use.   FAMILY HISTORY: Dad died from metastatic cancer unknown origin and mom had lupus.    REVIEW OF SYSTEMS:  CONSTITUTIONAL: No fever, fatigue, or weakness.  EYES: No blurry vision, double vision, inflammation, or glaucoma.  EAR, NOSE, THROAT: No tinnitus, ear pain, hearing loss, epistaxis, or discharge.  RESPIRATORY: No cough, wheeze, or hemoptysis. Positive for history of COPD.  CARDIOVASCULAR: No chest pain, orthopnea, edema, arrhythmia, palpitations, or syncope.  GASTROINTESTINAL: Positive for nausea and vomiting. No abdominal pain, hematemesis, or melena, diarrhea, constipation.  GENITOURINARY: No hematuria, renal calculus, frequency, or incontinence.  ENDOCRINE: No polyuria, nocturia, thyroid problems, heat or cold intolerance.  HEMATOLOGY: No anemia, easy bruising or bleeding.  SKIN: No acne, rash,  or lesions.  MUSCULOSKELETAL: Has chronic arthritis, joint pains.  NEUROLOGIC: Has peripheral neuropathy. No tingling, numbness, weakness, CVA, TIA, or seizures.  PSYCHIATRIC: Denies anxiety, insomnia, depression.   PHYSICAL EXAMINATION:  VITAL SIGNS: Temperature 98.3 degrees Fahrenheit, pulse 111, respirations 20, blood pressure 136/58, pulse oximetry 98% on room air.  GENERAL: Well-built, well-nourished female lying in  bed, not in any acute distress.  HEENT: Normocephalic, atraumatic. Pupils equal, round, reacting to light. Anicteric sclerae. Extraocular movements intact. Oropharynx clear without erythema, mass, or exudates.  NECK: Supple. No thyromegaly, JVD, or carotid bruits. No lymphadenopathy.  LUNGS: Moving air bilaterally. No wheeze or crackles. No use of accessory muscles for breathing.  CARDIOVASCULAR: S1, S2, regular rate and rhythm. No murmurs, rubs, or gallops.  ABDOMEN: Soft, nontender, nondistended. No hepatosplenomegaly. Normal bowel sounds.  EXTREMITIES: No pedal edema. No clubbing or cyanosis, 2+ dorsalis pedis pulses palpable bilaterally.  SKIN: No acne, rash, or lesions other than right gluteal region there is incision of about 6 cm visible with packing inside and dressing on top. Significant erythema and edema in the surrounding region noted. Severe tenderness also present.  NEUROLOGIC: Cranial nerves intact. No focal motor or sensory deficits.  PSYCHOLOGICAL: The patient is awake, alert, oriented x 3.  LABORATORY DATA:  1.  WBC 17.3, hemoglobin 14.6, hematocrit 43.8, platelet count 356,000.  2.  Sodium 132, potassium 3.8, chloride 97, bicarbonate 29, BUN 12, creatinine 0.79, glucose 229, and calcium of 8.9.  RECOMMENDATIONS: A 54 year old female with diabetes, peripheral neuropathy, chronic pain, hypertension, COPD admitted for right gluteal abscess and medical consult requested for medical management.  1.  Right gluteal abscess status post I and D by surgical team. Packing, dressing in place and culture sent. Started on Unasyn. We will add MRSA coverage with vancomycin for now. Further management per surgery.  2.  Diabetes mellitus. Continue insulin, Actos, Onglyza, and place on sliding scale insulin.  3.  Chronic pain neuropathy, and fibromyalgia: Follows with pain management clinic. Continue her home medications.  4.  Hypertension on HCTZ, lisinopril, and metoprolol.  5.  Tobacco use  disorder. Counseled against smoking for 3 minutes and started on nicotine patch for now.  CODE STATUS: Full code.   TIME SPENT IN CONSULTATION: 50 minutes.    ____________________________ Gladstone Lighter, MD rk:bm D: 02/05/2014 21:53:28 ET T: 02/06/2014 03:15:22 ET JOB#: [id number]  cc: Gladstone Lighter, MD, <Dictator> Cletis Athens, MD Gladstone Lighter MD ELECTRONICALLY SIGNED 03/03/2014 14:36

## 2014-07-13 ENCOUNTER — Ambulatory Visit
Admission: RE | Admit: 2014-07-13 | Discharge: 2014-07-13 | Disposition: A | Discharge status: Home / Self Care | Payer: Medicare Other | Source: Ambulatory Visit | Attending: Internal Medicine | Admitting: Internal Medicine

## 2014-07-13 ENCOUNTER — Ambulatory Visit
Admission: RE | Admit: 2014-07-13 | Discharge: 2014-07-13 | Disposition: A | Discharge status: Home / Self Care | Payer: Medicare Other | Source: Ambulatory Visit | Attending: Cardiology | Admitting: Cardiology

## 2014-07-13 ENCOUNTER — Other Ambulatory Visit: Payer: Self-pay | Admitting: Internal Medicine

## 2014-07-13 DIAGNOSIS — M545 Low back pain: Secondary | ICD-10-CM

## 2014-07-13 DIAGNOSIS — M4726 Other spondylosis with radiculopathy, lumbar region: Secondary | ICD-10-CM | POA: Diagnosis not present

## 2014-07-13 DIAGNOSIS — I7090 Unspecified atherosclerosis: Secondary | ICD-10-CM | POA: Insufficient documentation

## 2014-07-15 DIAGNOSIS — M797 Fibromyalgia: Secondary | ICD-10-CM | POA: Diagnosis not present

## 2014-07-15 DIAGNOSIS — R32 Unspecified urinary incontinence: Secondary | ICD-10-CM | POA: Diagnosis not present

## 2014-07-15 DIAGNOSIS — M549 Dorsalgia, unspecified: Secondary | ICD-10-CM | POA: Diagnosis not present

## 2014-07-15 DIAGNOSIS — G8929 Other chronic pain: Secondary | ICD-10-CM | POA: Diagnosis not present

## 2014-08-14 DIAGNOSIS — E784 Other hyperlipidemia: Secondary | ICD-10-CM | POA: Diagnosis not present

## 2014-08-14 DIAGNOSIS — M797 Fibromyalgia: Secondary | ICD-10-CM | POA: Diagnosis not present

## 2014-08-14 DIAGNOSIS — E119 Type 2 diabetes mellitus without complications: Secondary | ICD-10-CM | POA: Diagnosis not present

## 2014-08-14 DIAGNOSIS — G894 Chronic pain syndrome: Secondary | ICD-10-CM | POA: Diagnosis not present

## 2014-09-14 DIAGNOSIS — H9209 Otalgia, unspecified ear: Secondary | ICD-10-CM | POA: Diagnosis not present

## 2014-09-14 DIAGNOSIS — M5440 Lumbago with sciatica, unspecified side: Secondary | ICD-10-CM | POA: Diagnosis not present

## 2014-09-14 DIAGNOSIS — H9313 Tinnitus, bilateral: Secondary | ICD-10-CM | POA: Diagnosis not present

## 2014-09-14 DIAGNOSIS — H6691 Otitis media, unspecified, right ear: Secondary | ICD-10-CM | POA: Diagnosis not present

## 2014-10-15 DIAGNOSIS — Z87891 Personal history of nicotine dependence: Secondary | ICD-10-CM | POA: Diagnosis not present

## 2014-10-15 DIAGNOSIS — E119 Type 2 diabetes mellitus without complications: Secondary | ICD-10-CM | POA: Diagnosis not present

## 2014-10-15 DIAGNOSIS — G894 Chronic pain syndrome: Secondary | ICD-10-CM | POA: Diagnosis not present

## 2014-10-15 DIAGNOSIS — I517 Cardiomegaly: Secondary | ICD-10-CM | POA: Diagnosis not present

## 2014-11-16 DIAGNOSIS — M728 Other fibroblastic disorders: Secondary | ICD-10-CM | POA: Diagnosis not present

## 2014-11-16 DIAGNOSIS — G894 Chronic pain syndrome: Secondary | ICD-10-CM | POA: Diagnosis not present

## 2014-11-16 DIAGNOSIS — E1022 Type 1 diabetes mellitus with diabetic chronic kidney disease: Secondary | ICD-10-CM | POA: Diagnosis not present

## 2014-11-16 DIAGNOSIS — N189 Chronic kidney disease, unspecified: Secondary | ICD-10-CM | POA: Diagnosis not present

## 2014-11-17 DIAGNOSIS — Z23 Encounter for immunization: Secondary | ICD-10-CM | POA: Diagnosis not present

## 2014-11-17 DIAGNOSIS — E119 Type 2 diabetes mellitus without complications: Secondary | ICD-10-CM | POA: Diagnosis not present

## 2014-12-16 DIAGNOSIS — G8929 Other chronic pain: Secondary | ICD-10-CM | POA: Diagnosis not present

## 2014-12-16 DIAGNOSIS — M549 Dorsalgia, unspecified: Secondary | ICD-10-CM | POA: Diagnosis not present

## 2014-12-16 DIAGNOSIS — Z87891 Personal history of nicotine dependence: Secondary | ICD-10-CM | POA: Diagnosis not present

## 2014-12-16 DIAGNOSIS — M5440 Lumbago with sciatica, unspecified side: Secondary | ICD-10-CM | POA: Diagnosis not present

## 2015-01-13 DIAGNOSIS — M67911 Unspecified disorder of synovium and tendon, right shoulder: Secondary | ICD-10-CM | POA: Diagnosis not present

## 2015-01-13 DIAGNOSIS — M25511 Pain in right shoulder: Secondary | ICD-10-CM | POA: Diagnosis not present

## 2015-01-13 DIAGNOSIS — E119 Type 2 diabetes mellitus without complications: Secondary | ICD-10-CM | POA: Diagnosis not present

## 2015-02-15 DIAGNOSIS — M728 Other fibroblastic disorders: Secondary | ICD-10-CM | POA: Diagnosis not present

## 2015-02-15 DIAGNOSIS — Z87891 Personal history of nicotine dependence: Secondary | ICD-10-CM | POA: Diagnosis not present

## 2015-02-15 DIAGNOSIS — E119 Type 2 diabetes mellitus without complications: Secondary | ICD-10-CM | POA: Diagnosis not present

## 2015-02-15 DIAGNOSIS — N958 Other specified menopausal and perimenopausal disorders: Secondary | ICD-10-CM | POA: Diagnosis not present

## 2015-03-17 DIAGNOSIS — M5440 Lumbago with sciatica, unspecified side: Secondary | ICD-10-CM | POA: Diagnosis not present

## 2015-03-17 DIAGNOSIS — G8929 Other chronic pain: Secondary | ICD-10-CM | POA: Diagnosis not present

## 2015-03-17 DIAGNOSIS — E119 Type 2 diabetes mellitus without complications: Secondary | ICD-10-CM | POA: Diagnosis not present

## 2015-03-17 DIAGNOSIS — E784 Other hyperlipidemia: Secondary | ICD-10-CM | POA: Diagnosis not present

## 2015-04-14 DIAGNOSIS — M25511 Pain in right shoulder: Secondary | ICD-10-CM | POA: Diagnosis not present

## 2015-04-14 DIAGNOSIS — M5440 Lumbago with sciatica, unspecified side: Secondary | ICD-10-CM | POA: Diagnosis not present

## 2015-04-14 DIAGNOSIS — M797 Fibromyalgia: Secondary | ICD-10-CM | POA: Diagnosis not present

## 2015-04-14 DIAGNOSIS — E119 Type 2 diabetes mellitus without complications: Secondary | ICD-10-CM | POA: Diagnosis not present

## 2015-05-11 DIAGNOSIS — M5440 Lumbago with sciatica, unspecified side: Secondary | ICD-10-CM | POA: Diagnosis not present

## 2015-05-11 DIAGNOSIS — M797 Fibromyalgia: Secondary | ICD-10-CM | POA: Diagnosis not present

## 2015-05-11 DIAGNOSIS — I517 Cardiomegaly: Secondary | ICD-10-CM | POA: Diagnosis not present

## 2015-05-11 DIAGNOSIS — I10 Essential (primary) hypertension: Secondary | ICD-10-CM | POA: Diagnosis not present

## 2015-06-15 DIAGNOSIS — M797 Fibromyalgia: Secondary | ICD-10-CM | POA: Diagnosis not present

## 2015-06-15 DIAGNOSIS — E119 Type 2 diabetes mellitus without complications: Secondary | ICD-10-CM | POA: Diagnosis not present

## 2015-06-15 DIAGNOSIS — I10 Essential (primary) hypertension: Secondary | ICD-10-CM | POA: Diagnosis not present

## 2015-06-15 DIAGNOSIS — E784 Other hyperlipidemia: Secondary | ICD-10-CM | POA: Diagnosis not present

## 2015-06-15 DIAGNOSIS — R5381 Other malaise: Secondary | ICD-10-CM | POA: Diagnosis not present

## 2015-06-15 DIAGNOSIS — I517 Cardiomegaly: Secondary | ICD-10-CM | POA: Diagnosis not present

## 2015-06-15 DIAGNOSIS — M5440 Lumbago with sciatica, unspecified side: Secondary | ICD-10-CM | POA: Diagnosis not present

## 2015-07-02 DIAGNOSIS — M5440 Lumbago with sciatica, unspecified side: Secondary | ICD-10-CM | POA: Diagnosis not present

## 2015-07-02 DIAGNOSIS — I10 Essential (primary) hypertension: Secondary | ICD-10-CM | POA: Diagnosis not present

## 2015-07-02 DIAGNOSIS — I517 Cardiomegaly: Secondary | ICD-10-CM | POA: Diagnosis not present

## 2015-07-02 DIAGNOSIS — M797 Fibromyalgia: Secondary | ICD-10-CM | POA: Diagnosis not present

## 2015-07-15 DIAGNOSIS — M5440 Lumbago with sciatica, unspecified side: Secondary | ICD-10-CM | POA: Diagnosis not present

## 2015-07-15 DIAGNOSIS — I10 Essential (primary) hypertension: Secondary | ICD-10-CM | POA: Diagnosis not present

## 2015-07-15 DIAGNOSIS — M797 Fibromyalgia: Secondary | ICD-10-CM | POA: Diagnosis not present

## 2015-07-28 DIAGNOSIS — E1165 Type 2 diabetes mellitus with hyperglycemia: Secondary | ICD-10-CM | POA: Diagnosis not present

## 2015-08-03 DIAGNOSIS — E113591 Type 2 diabetes mellitus with proliferative diabetic retinopathy without macular edema, right eye: Secondary | ICD-10-CM | POA: Diagnosis not present

## 2015-08-03 DIAGNOSIS — H4311 Vitreous hemorrhage, right eye: Secondary | ICD-10-CM | POA: Diagnosis not present

## 2015-08-16 DIAGNOSIS — I10 Essential (primary) hypertension: Secondary | ICD-10-CM | POA: Diagnosis not present

## 2015-08-16 DIAGNOSIS — M797 Fibromyalgia: Secondary | ICD-10-CM | POA: Diagnosis not present

## 2015-08-16 DIAGNOSIS — M5440 Lumbago with sciatica, unspecified side: Secondary | ICD-10-CM | POA: Diagnosis not present

## 2015-08-16 DIAGNOSIS — I517 Cardiomegaly: Secondary | ICD-10-CM | POA: Diagnosis not present

## 2015-08-23 DIAGNOSIS — G8929 Other chronic pain: Secondary | ICD-10-CM | POA: Diagnosis not present

## 2015-08-23 DIAGNOSIS — M5412 Radiculopathy, cervical region: Secondary | ICD-10-CM | POA: Diagnosis not present

## 2015-08-23 DIAGNOSIS — M25511 Pain in right shoulder: Secondary | ICD-10-CM | POA: Diagnosis not present

## 2015-08-23 DIAGNOSIS — M7551 Bursitis of right shoulder: Secondary | ICD-10-CM | POA: Diagnosis not present

## 2015-09-03 DIAGNOSIS — E113591 Type 2 diabetes mellitus with proliferative diabetic retinopathy without macular edema, right eye: Secondary | ICD-10-CM | POA: Diagnosis not present

## 2015-09-15 DIAGNOSIS — E889 Metabolic disorder, unspecified: Secondary | ICD-10-CM | POA: Diagnosis not present

## 2015-09-15 DIAGNOSIS — E1143 Type 2 diabetes mellitus with diabetic autonomic (poly)neuropathy: Secondary | ICD-10-CM | POA: Diagnosis not present

## 2015-09-15 DIAGNOSIS — M5440 Lumbago with sciatica, unspecified side: Secondary | ICD-10-CM | POA: Diagnosis not present

## 2015-09-15 DIAGNOSIS — E1169 Type 2 diabetes mellitus with other specified complication: Secondary | ICD-10-CM | POA: Diagnosis not present

## 2015-09-17 DIAGNOSIS — E1143 Type 2 diabetes mellitus with diabetic autonomic (poly)neuropathy: Secondary | ICD-10-CM | POA: Diagnosis not present

## 2015-09-17 DIAGNOSIS — M4806 Spinal stenosis, lumbar region: Secondary | ICD-10-CM | POA: Diagnosis not present

## 2015-09-17 DIAGNOSIS — I739 Peripheral vascular disease, unspecified: Secondary | ICD-10-CM | POA: Diagnosis not present

## 2015-10-15 DIAGNOSIS — I739 Peripheral vascular disease, unspecified: Secondary | ICD-10-CM | POA: Diagnosis not present

## 2015-10-15 DIAGNOSIS — R252 Cramp and spasm: Secondary | ICD-10-CM | POA: Diagnosis not present

## 2015-10-15 DIAGNOSIS — M4806 Spinal stenosis, lumbar region: Secondary | ICD-10-CM | POA: Diagnosis not present

## 2015-10-15 DIAGNOSIS — G4762 Sleep related leg cramps: Secondary | ICD-10-CM | POA: Diagnosis not present

## 2015-10-16 ENCOUNTER — Emergency Department
Admission: EM | Admit: 2015-10-16 | Discharge: 2015-10-17 | Disposition: A | Discharge status: Home / Self Care | Payer: Medicare Other | Attending: Emergency Medicine | Admitting: Emergency Medicine

## 2015-10-16 ENCOUNTER — Encounter: Payer: Self-pay | Admitting: Emergency Medicine

## 2015-10-16 DIAGNOSIS — S0990XA Unspecified injury of head, initial encounter: Secondary | ICD-10-CM | POA: Diagnosis not present

## 2015-10-16 DIAGNOSIS — R221 Localized swelling, mass and lump, neck: Secondary | ICD-10-CM | POA: Diagnosis not present

## 2015-10-16 DIAGNOSIS — R220 Localized swelling, mass and lump, head: Secondary | ICD-10-CM | POA: Diagnosis not present

## 2015-10-16 DIAGNOSIS — Y929 Unspecified place or not applicable: Secondary | ICD-10-CM | POA: Insufficient documentation

## 2015-10-16 DIAGNOSIS — I10 Essential (primary) hypertension: Secondary | ICD-10-CM | POA: Diagnosis not present

## 2015-10-16 DIAGNOSIS — W06XXXA Fall from bed, initial encounter: Secondary | ICD-10-CM | POA: Insufficient documentation

## 2015-10-16 DIAGNOSIS — F17210 Nicotine dependence, cigarettes, uncomplicated: Secondary | ICD-10-CM | POA: Diagnosis not present

## 2015-10-16 DIAGNOSIS — W19XXXA Unspecified fall, initial encounter: Secondary | ICD-10-CM | POA: Diagnosis not present

## 2015-10-16 DIAGNOSIS — S0003XA Contusion of scalp, initial encounter: Secondary | ICD-10-CM | POA: Diagnosis not present

## 2015-10-16 DIAGNOSIS — S0993XA Unspecified injury of face, initial encounter: Secondary | ICD-10-CM | POA: Diagnosis not present

## 2015-10-16 DIAGNOSIS — R739 Hyperglycemia, unspecified: Secondary | ICD-10-CM

## 2015-10-16 DIAGNOSIS — S0083XA Contusion of other part of head, initial encounter: Secondary | ICD-10-CM | POA: Insufficient documentation

## 2015-10-16 DIAGNOSIS — E1165 Type 2 diabetes mellitus with hyperglycemia: Secondary | ICD-10-CM | POA: Insufficient documentation

## 2015-10-16 DIAGNOSIS — Y999 Unspecified external cause status: Secondary | ICD-10-CM | POA: Insufficient documentation

## 2015-10-16 DIAGNOSIS — Y939 Activity, unspecified: Secondary | ICD-10-CM | POA: Diagnosis not present

## 2015-10-16 DIAGNOSIS — M549 Dorsalgia, unspecified: Secondary | ICD-10-CM | POA: Diagnosis not present

## 2015-10-16 DIAGNOSIS — S199XXA Unspecified injury of neck, initial encounter: Secondary | ICD-10-CM | POA: Diagnosis not present

## 2015-10-16 HISTORY — DX: Cramp and spasm: R25.2

## 2015-10-16 HISTORY — DX: Neuralgia and neuritis, unspecified: M79.2

## 2015-10-16 HISTORY — DX: Type 2 diabetes mellitus without complications: E11.9

## 2015-10-16 HISTORY — DX: Essential (primary) hypertension: I10

## 2015-10-16 NOTE — ED Triage Notes (Signed)
Pt. States she was laying in bed in a up right position, fell asleep and fell out of bed striking rt. Side of face.  Pt. Has hematoma to rt. Orbital.  Pt. States pain and numbness to face.  Pt. Also c/o of HA.  Pt. Denies LOC.  Pt. States she is not on blood thinners.

## 2015-10-17 ENCOUNTER — Encounter: Payer: Self-pay | Admitting: Emergency Medicine

## 2015-10-17 ENCOUNTER — Emergency Department: Payer: Medicare Other

## 2015-10-17 DIAGNOSIS — R221 Localized swelling, mass and lump, neck: Secondary | ICD-10-CM | POA: Diagnosis not present

## 2015-10-17 DIAGNOSIS — S0003XA Contusion of scalp, initial encounter: Secondary | ICD-10-CM | POA: Diagnosis not present

## 2015-10-17 DIAGNOSIS — S0993XA Unspecified injury of face, initial encounter: Secondary | ICD-10-CM | POA: Diagnosis not present

## 2015-10-17 DIAGNOSIS — S199XXA Unspecified injury of neck, initial encounter: Secondary | ICD-10-CM | POA: Diagnosis not present

## 2015-10-17 DIAGNOSIS — R220 Localized swelling, mass and lump, head: Secondary | ICD-10-CM | POA: Diagnosis not present

## 2015-10-17 LAB — BASIC METABOLIC PANEL
ANION GAP: 9 (ref 5–15)
BUN: 38 mg/dL — ABNORMAL HIGH (ref 6–20)
CO2: 30 mmol/L (ref 22–32)
Calcium: 9.9 mg/dL (ref 8.9–10.3)
Chloride: 92 mmol/L — ABNORMAL LOW (ref 101–111)
Creatinine, Ser: 1.08 mg/dL — ABNORMAL HIGH (ref 0.44–1.00)
GFR, EST NON AFRICAN AMERICAN: 56 mL/min — AB (ref >60)
GLUCOSE: 445 mg/dL — AB (ref 65–99)
POTASSIUM: 3.6 mmol/L (ref 3.5–5.1)
Sodium: 131 mmol/L — ABNORMAL LOW (ref 135–145)

## 2015-10-17 LAB — TROPONIN I: Troponin I: <0.03 ng/mL (ref <0.03)

## 2015-10-17 LAB — GLUCOSE, CAPILLARY
GLUCOSE-CAPILLARY: 447 mg/dL — AB (ref 65–99)
Glucose-Capillary: 345 mg/dL — ABNORMAL HIGH (ref 65–99)

## 2015-10-17 LAB — CBC
HCT: 45.9 % (ref 35.0–47.0)
Hemoglobin: 16.2 g/dL — ABNORMAL HIGH (ref 12.0–16.0)
MCH: 32 pg (ref 26.0–34.0)
MCHC: 35.4 g/dL (ref 32.0–36.0)
MCV: 90.5 fL (ref 80.0–100.0)
Platelets: 202 10*3/uL (ref 150–440)
RBC: 5.07 MIL/uL (ref 3.80–5.20)
RDW: 12.9 % (ref 11.5–14.5)
WBC: 12.9 10*3/uL — ABNORMAL HIGH (ref 3.6–11.0)

## 2015-10-17 MED ORDER — TRAMADOL HCL 50 MG PO TABS: 50.0000 mg | ORAL_TABLET | Freq: Four times a day (QID) | ORAL | 0 refills | Status: DC | PRN

## 2015-10-17 MED ORDER — MORPHINE SULFATE (PF) 4 MG/ML IV SOLN
4.0000 mg | Freq: Once | INTRAVENOUS | Status: AC
  Administered 2015-10-17: 4 mg via INTRAVENOUS

## 2015-10-17 MED ORDER — SODIUM CHLORIDE 0.9 % IV BOLUS (SEPSIS)
1000.0000 mL | Freq: Once | INTRAVENOUS | Status: AC
  Administered 2015-10-17: 1000 mL via INTRAVENOUS

## 2015-10-17 MED ORDER — INSULIN ASPART 100 UNIT/ML ~~LOC~~ SOLN
8.0000 [IU] | Freq: Once | SUBCUTANEOUS | Status: AC
  Administered 2015-10-17: 8 [IU] via SUBCUTANEOUS
  Filled 2015-10-17: qty 8

## 2015-10-17 MED ORDER — MORPHINE SULFATE (PF) 4 MG/ML IV SOLN
INTRAVENOUS | Status: AC
  Administered 2015-10-17: 4 mg via INTRAVENOUS
  Filled 2015-10-17: qty 1

## 2015-10-17 MED ORDER — ONDANSETRON HCL 4 MG/2ML IJ SOLN
4.0000 mg | Freq: Once | INTRAMUSCULAR | Status: AC
  Administered 2015-10-17: 4 mg via INTRAVENOUS

## 2015-10-17 MED ORDER — ONDANSETRON HCL 4 MG/2ML IJ SOLN
INTRAMUSCULAR | Status: AC
  Administered 2015-10-17: 4 mg via INTRAVENOUS
  Filled 2015-10-17: qty 2

## 2015-10-17 NOTE — ED Provider Notes (Signed)
[facility] Emergency Department Provider Note   ____________________________________________   First MD Initiated Contact with Patient 10/17/15 0015     (approximate)  I have reviewed the triage vital signs and the nursing notes.   HISTORY  Chief Complaint Fall (Pt. fell from laying in bed and struck rt. side of head.  Pt. has hematoma to rt. side of orbital.)    HPI Colleen Peters is a 55 y.o. female who comes into the hospital today a of bed. The patient reports that she hit her head on a windowsill. She reports that she was in a mechanical bed that was sitting up. 2 days ago the patient's son reports he found her hunched over in bed and it took some time to wake her up. The patient reports that she feels as though someone hit her in the head with a hammer. She reports that she hit her head so hard that she urinated on herself. The patient typically uses a walker at baseline and they reports she was at the pool all day. The family feels that she was just worn out from the day. The patient rates her pain 8 out of 10 in intensity and has some pain in her neck and also going down the right side of her face.the patient reports that the paramedics put ice on her head but she did not take anything else for pain. She denies of loss consciousness but has had  Blurred vision as well as some dizziness especially when she closes her eyes. The patient does feel nauseous and denies chest pain tonight. The patient though has some angina as well as some digestive issues and has occasion chest pain as vomting. She has some mild confusion per her family. The patient is here for evaluation.   Past Medical History:  Diagnosis Date  . Diabetes mellitus without complication ([place])   . Hypertension   . Leg cramping   . Nerve pain     There are no active problems to display for this patient.   Past Surgical History:  Procedure Laterality Date  . ABDOMINAL HYSTERECTOMY      . APPENDECTOMY    . BACK SURGERY    . bone spur disease    . CHOLECYSTECTOMY    . lt elebow surgery  2008  . ovarian surgery for cyst    . rt great toe surgery      Prior to Admission medications   Medication Sig Start Date End Date Taking? Authorizing Provider  traMADol (ULTRAM) 50 MG tablet Take 1 tablet (50 mg total) by mouth every 6 (six) hours as needed. 10/17/15   Loney Hering, MD    Allergies Bactrim [sulfamethoxazole-trimethoprim]  No family history on file.  Social History Social History  Substance Use Topics  . Smoking status: Current Every Day Smoker    Packs/day: 1.00    Types: Cigarettes  . Smokeless tobacco: Not on file  . Alcohol use No    Review of Systems Constitutional: No fever/chills Eyes: No visual changes. ENT: Blurred vision Cardiovascular: Denies chest pain. Respiratory: Denies shortness of breath. Gastrointestinal: Nausea, No abdominal pain. no vomiting.  No diarrhea.  No constipation. Genitourinary: Negative for dysuria. Musculoskeletal:  back pain. Skin: Negative for rash. Neurological: Dizziness and head pain  10-point ROS otherwise negative.  ____________________________________________   PHYSICAL EXAM:  VITAL SIGNS: ED Triage Vitals  Enc Vitals Group     BP 10/16/15 2344 (!) 174/74     Pulse  Rate 10/16/15 2344 99     Resp 10/16/15 2344 16     Temp 10/16/15 2344 98 F (36.7 C)     Temp Source 10/16/15 2344 Oral     SpO2 10/16/15 2344 96 %     Weight 10/16/15 2346 162 lb (73.5 kg)     Height 10/16/15 2346 5\' 6"  (1.676 m)     Head Circumference --      Peak Flow --      Pain Score 10/16/15 2347 9     Pain Loc --      Pain Edu? --      Excl. in [place]? --     Constitutional: Alert and oriented. Well appearing and in Moderate distress. Eyes: Conjunctivae are normal. PERRL. EOMI. Head: Large contusion to right temporal scalp Nose: No congestion/rhinnorhea. Mouth/Throat: Mucous membranes are moist.  Oropharynx  non-erythematous. Cardiovascular: Normal rate, regular rhythm. Grossly normal heart sounds.  Good peripheral circulation. Respiratory: Normal respiratory effort.  No retractions. Lungs CTAB. Gastrointestinal: Soft and nontender. No distention. Positive bowel sounds Musculoskeletal: No lower extremity tenderness nor edema.   Neurologic:  Normal speech and language. Cranial nerves II through XII are grossly intact, patient reports some intermittent left facial numbness. No focal motor or neuro deficits Skin:  Skin is warm, dry and intact. Contusion to right temporal scalp Psychiatric: Mood and affect are normal.   ____________________________________________   LABS (all labs ordered are listed, but only abnormal results are displayed)  Labs Reviewed  CBC - Abnormal; Notable for the following:       Result Value   WBC 12.9 (*)    Hemoglobin 16.2 (*)    All other components within normal limits  BASIC METABOLIC PANEL - Abnormal; Notable for the following:    Sodium 131 (*)    Chloride 92 (*)    Glucose, Bld 445 (*)    BUN 38 (*)    Creatinine, Ser 1.08 (*)    GFR calc non Af Amer 56 (*)    All other components within normal limits  GLUCOSE, CAPILLARY - Abnormal; Notable for the following:    Glucose-Capillary 447 (*)    All other components within normal limits  GLUCOSE, CAPILLARY - Abnormal; Notable for the following:    Glucose-Capillary 345 (*)    All other components within normal limits  TROPONIN I  URINALYSIS COMPLETEWITH MICROSCOPIC ([facility] ONLY)  CBG MONITORING, ED  CBG MONITORING, ED   ____________________________________________  EKG  ED ECG REPORT I, Loney Hering, the attending physician, personally viewed and interpreted this ECG.   Date: 10/16/2015  EKG Time: 2347  Rate: 99  Rhythm: normal sinus rhythm  Axis: normal  Intervals:none  ST&T Change: st depressions II, III ,avf, V6  ____________________________________________  RADIOLOGY  CT head,  cervical spine and maxface ____________________________________________   PROCEDURES  Procedure(s) performed: None  Procedures  Critical Care performed: No  ____________________________________________   INITIAL IMPRESSION / ASSESSMENT AND PLAN / ED COURSE  Pertinent labs & imaging results that were available during my care of the patient were reviewed by me and considered in my medical decision making (see chart for details).  This is a 55 year old female who comes into the hospital today after falling out of bed with a large contusion to her head. I will check some blood work as well as a CT scan. I'll give the patient a dose of morphine as well as Zofran and a liter of normal saline. The patient be reassessed  once I received the results of her imaging and her blood work.  Clinical Course  Value Comment By Time  CT Head Wo Contrast 1. No evidence of traumatic intracranial injury or fracture. 2. No evidence of fracture or dislocation with regard to the maxillofacial structures. 3. No evidence of fracture or subluxation along the cervical spine. 4. Soft tissue swelling overlying the right frontal calvarium. 5. Mild small vessel ischemic microangiopathy. 6. Mild calcification at the carotid bifurcations bilaterally. Carotid ultrasound could be considered for further evaluation, when and as deemed clinically appropriate. 7. Mild degenerative change along the cervical spine.   Loney Hering, MD 08/20 0114   The patient's glucose is 445. I will give the patient a liter of normal saline and then reassess the patient's glucose. Her imaging does not show any intracranial hemorrhage or skull fracture. Once the patient's glucose is improved and her urinalysis is obtained she will be discharged home. Loney Hering, MD 08/20 0200     ____________________________________________   FINAL CLINICAL IMPRESSION(S) / ED DIAGNOSES  Final diagnoses:  Fall, initial encounter  Facial  contusion, initial encounter  Head injury, initial encounter  Hyperglycemia   The patient's imaging is unremarkable. I looked at the blood work and appear as though her blood sugar was elevated. After 8 units of subcutaneous insulin and 2 L of normal saline were able to get the patient's blood sugar down to 345. The patient reports that she did not take her insulin at home nor did she take her metformin. The patient did not urinate and reports that she should try to go home after being here for multiple hours. As the patient does not have a skull fracture or intracranial hemorrhage I will discharge her home and have her follow-up with her primary care physician.   NEW MEDICATIONS STARTED DURING THIS VISIT:  Discharge Medication List as of 10/17/2015  5:24 AM    START taking these medications   Details  traMADol (ULTRAM) 50 MG tablet Take 1 tablet (50 mg total) by mouth every 6 (six) hours as needed., Starting Sun 10/17/2015, Print         Note:  This document was prepared using Dragon voice recognition software and may include unintentional dictation errors.    Loney Hering, MD 10/17/15 [phone]

## 2015-10-17 NOTE — ED Notes (Signed)
Pt. States she was sitting up in bed and fell from bed when she was asleep.  Pt. Struck head on window sill.  Pt. States HA and pain to face.  Pt. States she feels dizzy.

## 2015-10-17 NOTE — ED Notes (Signed)
POC CBG 345

## 2015-10-19 DIAGNOSIS — S060X9A Concussion with loss of consciousness of unspecified duration, initial encounter: Secondary | ICD-10-CM | POA: Diagnosis not present

## 2015-10-19 DIAGNOSIS — S0003XA Contusion of scalp, initial encounter: Secondary | ICD-10-CM | POA: Diagnosis not present

## 2015-10-19 DIAGNOSIS — M5440 Lumbago with sciatica, unspecified side: Secondary | ICD-10-CM | POA: Diagnosis not present

## 2015-11-15 DIAGNOSIS — I739 Peripheral vascular disease, unspecified: Secondary | ICD-10-CM | POA: Diagnosis not present

## 2015-11-15 DIAGNOSIS — G4762 Sleep related leg cramps: Secondary | ICD-10-CM | POA: Diagnosis not present

## 2015-11-15 DIAGNOSIS — M5440 Lumbago with sciatica, unspecified side: Secondary | ICD-10-CM | POA: Diagnosis not present

## 2015-11-15 DIAGNOSIS — Z23 Encounter for immunization: Secondary | ICD-10-CM | POA: Diagnosis not present

## 2015-11-16 DIAGNOSIS — M5126 Other intervertebral disc displacement, lumbar region: Secondary | ICD-10-CM | POA: Diagnosis not present

## 2015-11-16 DIAGNOSIS — E1143 Type 2 diabetes mellitus with diabetic autonomic (poly)neuropathy: Secondary | ICD-10-CM | POA: Diagnosis not present

## 2015-11-16 DIAGNOSIS — M6281 Muscle weakness (generalized): Secondary | ICD-10-CM | POA: Diagnosis not present

## 2015-11-16 DIAGNOSIS — I129 Hypertensive chronic kidney disease with stage 1 through stage 4 chronic kidney disease, or unspecified chronic kidney disease: Secondary | ICD-10-CM | POA: Diagnosis not present

## 2015-11-16 DIAGNOSIS — N181 Chronic kidney disease, stage 1: Secondary | ICD-10-CM | POA: Diagnosis not present

## 2015-11-16 DIAGNOSIS — F341 Dysthymic disorder: Secondary | ICD-10-CM | POA: Diagnosis not present

## 2015-11-19 DIAGNOSIS — I129 Hypertensive chronic kidney disease with stage 1 through stage 4 chronic kidney disease, or unspecified chronic kidney disease: Secondary | ICD-10-CM | POA: Diagnosis not present

## 2015-11-19 DIAGNOSIS — M6281 Muscle weakness (generalized): Secondary | ICD-10-CM | POA: Diagnosis not present

## 2015-11-19 DIAGNOSIS — E1143 Type 2 diabetes mellitus with diabetic autonomic (poly)neuropathy: Secondary | ICD-10-CM | POA: Diagnosis not present

## 2015-11-19 DIAGNOSIS — F341 Dysthymic disorder: Secondary | ICD-10-CM | POA: Diagnosis not present

## 2015-11-19 DIAGNOSIS — N181 Chronic kidney disease, stage 1: Secondary | ICD-10-CM | POA: Diagnosis not present

## 2015-11-19 DIAGNOSIS — M5126 Other intervertebral disc displacement, lumbar region: Secondary | ICD-10-CM | POA: Diagnosis not present

## 2015-11-22 DIAGNOSIS — I129 Hypertensive chronic kidney disease with stage 1 through stage 4 chronic kidney disease, or unspecified chronic kidney disease: Secondary | ICD-10-CM | POA: Diagnosis not present

## 2015-11-22 DIAGNOSIS — E1143 Type 2 diabetes mellitus with diabetic autonomic (poly)neuropathy: Secondary | ICD-10-CM | POA: Diagnosis not present

## 2015-11-22 DIAGNOSIS — M5126 Other intervertebral disc displacement, lumbar region: Secondary | ICD-10-CM | POA: Diagnosis not present

## 2015-11-22 DIAGNOSIS — N181 Chronic kidney disease, stage 1: Secondary | ICD-10-CM | POA: Diagnosis not present

## 2015-11-22 DIAGNOSIS — M6281 Muscle weakness (generalized): Secondary | ICD-10-CM | POA: Diagnosis not present

## 2015-11-22 DIAGNOSIS — F341 Dysthymic disorder: Secondary | ICD-10-CM | POA: Diagnosis not present

## 2015-11-25 DIAGNOSIS — F341 Dysthymic disorder: Secondary | ICD-10-CM | POA: Diagnosis not present

## 2015-11-25 DIAGNOSIS — E1143 Type 2 diabetes mellitus with diabetic autonomic (poly)neuropathy: Secondary | ICD-10-CM | POA: Diagnosis not present

## 2015-11-25 DIAGNOSIS — M6281 Muscle weakness (generalized): Secondary | ICD-10-CM | POA: Diagnosis not present

## 2015-11-25 DIAGNOSIS — I129 Hypertensive chronic kidney disease with stage 1 through stage 4 chronic kidney disease, or unspecified chronic kidney disease: Secondary | ICD-10-CM | POA: Diagnosis not present

## 2015-11-25 DIAGNOSIS — N181 Chronic kidney disease, stage 1: Secondary | ICD-10-CM | POA: Diagnosis not present

## 2015-11-25 DIAGNOSIS — M5126 Other intervertebral disc displacement, lumbar region: Secondary | ICD-10-CM | POA: Diagnosis not present

## 2015-11-26 DIAGNOSIS — E1143 Type 2 diabetes mellitus with diabetic autonomic (poly)neuropathy: Secondary | ICD-10-CM | POA: Diagnosis not present

## 2015-11-26 DIAGNOSIS — F341 Dysthymic disorder: Secondary | ICD-10-CM | POA: Diagnosis not present

## 2015-11-26 DIAGNOSIS — M6281 Muscle weakness (generalized): Secondary | ICD-10-CM | POA: Diagnosis not present

## 2015-11-26 DIAGNOSIS — N181 Chronic kidney disease, stage 1: Secondary | ICD-10-CM | POA: Diagnosis not present

## 2015-11-26 DIAGNOSIS — M5126 Other intervertebral disc displacement, lumbar region: Secondary | ICD-10-CM | POA: Diagnosis not present

## 2015-11-26 DIAGNOSIS — I129 Hypertensive chronic kidney disease with stage 1 through stage 4 chronic kidney disease, or unspecified chronic kidney disease: Secondary | ICD-10-CM | POA: Diagnosis not present

## 2015-11-29 DIAGNOSIS — E1143 Type 2 diabetes mellitus with diabetic autonomic (poly)neuropathy: Secondary | ICD-10-CM | POA: Diagnosis not present

## 2015-11-29 DIAGNOSIS — I129 Hypertensive chronic kidney disease with stage 1 through stage 4 chronic kidney disease, or unspecified chronic kidney disease: Secondary | ICD-10-CM | POA: Diagnosis not present

## 2015-11-29 DIAGNOSIS — N181 Chronic kidney disease, stage 1: Secondary | ICD-10-CM | POA: Diagnosis not present

## 2015-11-29 DIAGNOSIS — F341 Dysthymic disorder: Secondary | ICD-10-CM | POA: Diagnosis not present

## 2015-11-29 DIAGNOSIS — M5126 Other intervertebral disc displacement, lumbar region: Secondary | ICD-10-CM | POA: Diagnosis not present

## 2015-11-29 DIAGNOSIS — M6281 Muscle weakness (generalized): Secondary | ICD-10-CM | POA: Diagnosis not present

## 2015-12-01 DIAGNOSIS — F341 Dysthymic disorder: Secondary | ICD-10-CM | POA: Diagnosis not present

## 2015-12-01 DIAGNOSIS — N181 Chronic kidney disease, stage 1: Secondary | ICD-10-CM | POA: Diagnosis not present

## 2015-12-01 DIAGNOSIS — M6281 Muscle weakness (generalized): Secondary | ICD-10-CM | POA: Diagnosis not present

## 2015-12-01 DIAGNOSIS — M5126 Other intervertebral disc displacement, lumbar region: Secondary | ICD-10-CM | POA: Diagnosis not present

## 2015-12-01 DIAGNOSIS — E1143 Type 2 diabetes mellitus with diabetic autonomic (poly)neuropathy: Secondary | ICD-10-CM | POA: Diagnosis not present

## 2015-12-01 DIAGNOSIS — I129 Hypertensive chronic kidney disease with stage 1 through stage 4 chronic kidney disease, or unspecified chronic kidney disease: Secondary | ICD-10-CM | POA: Diagnosis not present

## 2015-12-03 DIAGNOSIS — M6281 Muscle weakness (generalized): Secondary | ICD-10-CM | POA: Diagnosis not present

## 2015-12-03 DIAGNOSIS — N181 Chronic kidney disease, stage 1: Secondary | ICD-10-CM | POA: Diagnosis not present

## 2015-12-03 DIAGNOSIS — E1143 Type 2 diabetes mellitus with diabetic autonomic (poly)neuropathy: Secondary | ICD-10-CM | POA: Diagnosis not present

## 2015-12-03 DIAGNOSIS — M5126 Other intervertebral disc displacement, lumbar region: Secondary | ICD-10-CM | POA: Diagnosis not present

## 2015-12-03 DIAGNOSIS — I129 Hypertensive chronic kidney disease with stage 1 through stage 4 chronic kidney disease, or unspecified chronic kidney disease: Secondary | ICD-10-CM | POA: Diagnosis not present

## 2015-12-03 DIAGNOSIS — F341 Dysthymic disorder: Secondary | ICD-10-CM | POA: Diagnosis not present

## 2015-12-06 DIAGNOSIS — N181 Chronic kidney disease, stage 1: Secondary | ICD-10-CM | POA: Diagnosis not present

## 2015-12-06 DIAGNOSIS — I129 Hypertensive chronic kidney disease with stage 1 through stage 4 chronic kidney disease, or unspecified chronic kidney disease: Secondary | ICD-10-CM | POA: Diagnosis not present

## 2015-12-06 DIAGNOSIS — M5126 Other intervertebral disc displacement, lumbar region: Secondary | ICD-10-CM | POA: Diagnosis not present

## 2015-12-06 DIAGNOSIS — E1143 Type 2 diabetes mellitus with diabetic autonomic (poly)neuropathy: Secondary | ICD-10-CM | POA: Diagnosis not present

## 2015-12-06 DIAGNOSIS — M6281 Muscle weakness (generalized): Secondary | ICD-10-CM | POA: Diagnosis not present

## 2015-12-06 DIAGNOSIS — F341 Dysthymic disorder: Secondary | ICD-10-CM | POA: Diagnosis not present

## 2015-12-07 DIAGNOSIS — F341 Dysthymic disorder: Secondary | ICD-10-CM | POA: Diagnosis not present

## 2015-12-07 DIAGNOSIS — M6281 Muscle weakness (generalized): Secondary | ICD-10-CM | POA: Diagnosis not present

## 2015-12-07 DIAGNOSIS — M5126 Other intervertebral disc displacement, lumbar region: Secondary | ICD-10-CM | POA: Diagnosis not present

## 2015-12-07 DIAGNOSIS — E1143 Type 2 diabetes mellitus with diabetic autonomic (poly)neuropathy: Secondary | ICD-10-CM | POA: Diagnosis not present

## 2015-12-07 DIAGNOSIS — N181 Chronic kidney disease, stage 1: Secondary | ICD-10-CM | POA: Diagnosis not present

## 2015-12-07 DIAGNOSIS — I129 Hypertensive chronic kidney disease with stage 1 through stage 4 chronic kidney disease, or unspecified chronic kidney disease: Secondary | ICD-10-CM | POA: Diagnosis not present

## 2015-12-08 DIAGNOSIS — I129 Hypertensive chronic kidney disease with stage 1 through stage 4 chronic kidney disease, or unspecified chronic kidney disease: Secondary | ICD-10-CM | POA: Diagnosis not present

## 2015-12-08 DIAGNOSIS — M5126 Other intervertebral disc displacement, lumbar region: Secondary | ICD-10-CM | POA: Diagnosis not present

## 2015-12-08 DIAGNOSIS — E1143 Type 2 diabetes mellitus with diabetic autonomic (poly)neuropathy: Secondary | ICD-10-CM | POA: Diagnosis not present

## 2015-12-08 DIAGNOSIS — N181 Chronic kidney disease, stage 1: Secondary | ICD-10-CM | POA: Diagnosis not present

## 2015-12-08 DIAGNOSIS — F341 Dysthymic disorder: Secondary | ICD-10-CM | POA: Diagnosis not present

## 2015-12-08 DIAGNOSIS — M6281 Muscle weakness (generalized): Secondary | ICD-10-CM | POA: Diagnosis not present

## 2015-12-14 DIAGNOSIS — F341 Dysthymic disorder: Secondary | ICD-10-CM | POA: Diagnosis not present

## 2015-12-14 DIAGNOSIS — N181 Chronic kidney disease, stage 1: Secondary | ICD-10-CM | POA: Diagnosis not present

## 2015-12-14 DIAGNOSIS — M6281 Muscle weakness (generalized): Secondary | ICD-10-CM | POA: Diagnosis not present

## 2015-12-14 DIAGNOSIS — E1143 Type 2 diabetes mellitus with diabetic autonomic (poly)neuropathy: Secondary | ICD-10-CM | POA: Diagnosis not present

## 2015-12-14 DIAGNOSIS — I129 Hypertensive chronic kidney disease with stage 1 through stage 4 chronic kidney disease, or unspecified chronic kidney disease: Secondary | ICD-10-CM | POA: Diagnosis not present

## 2015-12-14 DIAGNOSIS — M5126 Other intervertebral disc displacement, lumbar region: Secondary | ICD-10-CM | POA: Diagnosis not present

## 2015-12-15 DIAGNOSIS — E1143 Type 2 diabetes mellitus with diabetic autonomic (poly)neuropathy: Secondary | ICD-10-CM | POA: Diagnosis not present

## 2015-12-15 DIAGNOSIS — I517 Cardiomegaly: Secondary | ICD-10-CM | POA: Diagnosis not present

## 2015-12-15 DIAGNOSIS — G894 Chronic pain syndrome: Secondary | ICD-10-CM | POA: Diagnosis not present

## 2015-12-15 DIAGNOSIS — E119 Type 2 diabetes mellitus without complications: Secondary | ICD-10-CM | POA: Diagnosis not present

## 2015-12-16 DIAGNOSIS — E1143 Type 2 diabetes mellitus with diabetic autonomic (poly)neuropathy: Secondary | ICD-10-CM | POA: Diagnosis not present

## 2015-12-16 DIAGNOSIS — N181 Chronic kidney disease, stage 1: Secondary | ICD-10-CM | POA: Diagnosis not present

## 2015-12-16 DIAGNOSIS — M5126 Other intervertebral disc displacement, lumbar region: Secondary | ICD-10-CM | POA: Diagnosis not present

## 2015-12-16 DIAGNOSIS — M6281 Muscle weakness (generalized): Secondary | ICD-10-CM | POA: Diagnosis not present

## 2015-12-16 DIAGNOSIS — F341 Dysthymic disorder: Secondary | ICD-10-CM | POA: Diagnosis not present

## 2015-12-16 DIAGNOSIS — I129 Hypertensive chronic kidney disease with stage 1 through stage 4 chronic kidney disease, or unspecified chronic kidney disease: Secondary | ICD-10-CM | POA: Diagnosis not present

## 2015-12-19 DIAGNOSIS — E1143 Type 2 diabetes mellitus with diabetic autonomic (poly)neuropathy: Secondary | ICD-10-CM | POA: Diagnosis not present

## 2015-12-19 DIAGNOSIS — F341 Dysthymic disorder: Secondary | ICD-10-CM | POA: Diagnosis not present

## 2015-12-19 DIAGNOSIS — M5126 Other intervertebral disc displacement, lumbar region: Secondary | ICD-10-CM | POA: Diagnosis not present

## 2015-12-19 DIAGNOSIS — I129 Hypertensive chronic kidney disease with stage 1 through stage 4 chronic kidney disease, or unspecified chronic kidney disease: Secondary | ICD-10-CM | POA: Diagnosis not present

## 2015-12-19 DIAGNOSIS — M6281 Muscle weakness (generalized): Secondary | ICD-10-CM | POA: Diagnosis not present

## 2015-12-19 DIAGNOSIS — N181 Chronic kidney disease, stage 1: Secondary | ICD-10-CM | POA: Diagnosis not present

## 2015-12-21 DIAGNOSIS — E1143 Type 2 diabetes mellitus with diabetic autonomic (poly)neuropathy: Secondary | ICD-10-CM | POA: Diagnosis not present

## 2015-12-21 DIAGNOSIS — F341 Dysthymic disorder: Secondary | ICD-10-CM | POA: Diagnosis not present

## 2015-12-21 DIAGNOSIS — I129 Hypertensive chronic kidney disease with stage 1 through stage 4 chronic kidney disease, or unspecified chronic kidney disease: Secondary | ICD-10-CM | POA: Diagnosis not present

## 2015-12-21 DIAGNOSIS — M5126 Other intervertebral disc displacement, lumbar region: Secondary | ICD-10-CM | POA: Diagnosis not present

## 2015-12-21 DIAGNOSIS — M6281 Muscle weakness (generalized): Secondary | ICD-10-CM | POA: Diagnosis not present

## 2015-12-21 DIAGNOSIS — N181 Chronic kidney disease, stage 1: Secondary | ICD-10-CM | POA: Diagnosis not present

## 2015-12-23 DIAGNOSIS — I129 Hypertensive chronic kidney disease with stage 1 through stage 4 chronic kidney disease, or unspecified chronic kidney disease: Secondary | ICD-10-CM | POA: Diagnosis not present

## 2015-12-23 DIAGNOSIS — M5126 Other intervertebral disc displacement, lumbar region: Secondary | ICD-10-CM | POA: Diagnosis not present

## 2015-12-23 DIAGNOSIS — E1143 Type 2 diabetes mellitus with diabetic autonomic (poly)neuropathy: Secondary | ICD-10-CM | POA: Diagnosis not present

## 2015-12-23 DIAGNOSIS — N181 Chronic kidney disease, stage 1: Secondary | ICD-10-CM | POA: Diagnosis not present

## 2015-12-23 DIAGNOSIS — M6281 Muscle weakness (generalized): Secondary | ICD-10-CM | POA: Diagnosis not present

## 2015-12-23 DIAGNOSIS — F341 Dysthymic disorder: Secondary | ICD-10-CM | POA: Diagnosis not present

## 2016-01-04 DIAGNOSIS — I129 Hypertensive chronic kidney disease with stage 1 through stage 4 chronic kidney disease, or unspecified chronic kidney disease: Secondary | ICD-10-CM | POA: Diagnosis not present

## 2016-01-04 DIAGNOSIS — F341 Dysthymic disorder: Secondary | ICD-10-CM | POA: Diagnosis not present

## 2016-01-04 DIAGNOSIS — E1143 Type 2 diabetes mellitus with diabetic autonomic (poly)neuropathy: Secondary | ICD-10-CM | POA: Diagnosis not present

## 2016-01-04 DIAGNOSIS — M5126 Other intervertebral disc displacement, lumbar region: Secondary | ICD-10-CM | POA: Diagnosis not present

## 2016-01-04 DIAGNOSIS — M6281 Muscle weakness (generalized): Secondary | ICD-10-CM | POA: Diagnosis not present

## 2016-01-04 DIAGNOSIS — N181 Chronic kidney disease, stage 1: Secondary | ICD-10-CM | POA: Diagnosis not present

## 2016-01-06 DIAGNOSIS — I129 Hypertensive chronic kidney disease with stage 1 through stage 4 chronic kidney disease, or unspecified chronic kidney disease: Secondary | ICD-10-CM | POA: Diagnosis not present

## 2016-01-06 DIAGNOSIS — N181 Chronic kidney disease, stage 1: Secondary | ICD-10-CM | POA: Diagnosis not present

## 2016-01-06 DIAGNOSIS — M5126 Other intervertebral disc displacement, lumbar region: Secondary | ICD-10-CM | POA: Diagnosis not present

## 2016-01-06 DIAGNOSIS — M6281 Muscle weakness (generalized): Secondary | ICD-10-CM | POA: Diagnosis not present

## 2016-01-06 DIAGNOSIS — E1143 Type 2 diabetes mellitus with diabetic autonomic (poly)neuropathy: Secondary | ICD-10-CM | POA: Diagnosis not present

## 2016-01-06 DIAGNOSIS — F341 Dysthymic disorder: Secondary | ICD-10-CM | POA: Diagnosis not present

## 2016-01-12 DIAGNOSIS — N181 Chronic kidney disease, stage 1: Secondary | ICD-10-CM | POA: Diagnosis not present

## 2016-01-12 DIAGNOSIS — F341 Dysthymic disorder: Secondary | ICD-10-CM | POA: Diagnosis not present

## 2016-01-12 DIAGNOSIS — E1143 Type 2 diabetes mellitus with diabetic autonomic (poly)neuropathy: Secondary | ICD-10-CM | POA: Diagnosis not present

## 2016-01-12 DIAGNOSIS — I129 Hypertensive chronic kidney disease with stage 1 through stage 4 chronic kidney disease, or unspecified chronic kidney disease: Secondary | ICD-10-CM | POA: Diagnosis not present

## 2016-01-12 DIAGNOSIS — M6281 Muscle weakness (generalized): Secondary | ICD-10-CM | POA: Diagnosis not present

## 2016-01-12 DIAGNOSIS — M5126 Other intervertebral disc displacement, lumbar region: Secondary | ICD-10-CM | POA: Diagnosis not present

## 2016-01-13 DIAGNOSIS — F341 Dysthymic disorder: Secondary | ICD-10-CM | POA: Diagnosis not present

## 2016-01-13 DIAGNOSIS — I129 Hypertensive chronic kidney disease with stage 1 through stage 4 chronic kidney disease, or unspecified chronic kidney disease: Secondary | ICD-10-CM | POA: Diagnosis not present

## 2016-01-13 DIAGNOSIS — M5126 Other intervertebral disc displacement, lumbar region: Secondary | ICD-10-CM | POA: Diagnosis not present

## 2016-01-13 DIAGNOSIS — E1143 Type 2 diabetes mellitus with diabetic autonomic (poly)neuropathy: Secondary | ICD-10-CM | POA: Diagnosis not present

## 2016-01-13 DIAGNOSIS — N181 Chronic kidney disease, stage 1: Secondary | ICD-10-CM | POA: Diagnosis not present

## 2016-01-13 DIAGNOSIS — M6281 Muscle weakness (generalized): Secondary | ICD-10-CM | POA: Diagnosis not present

## 2016-01-15 DIAGNOSIS — I129 Hypertensive chronic kidney disease with stage 1 through stage 4 chronic kidney disease, or unspecified chronic kidney disease: Secondary | ICD-10-CM | POA: Diagnosis not present

## 2016-01-15 DIAGNOSIS — E1143 Type 2 diabetes mellitus with diabetic autonomic (poly)neuropathy: Secondary | ICD-10-CM | POA: Diagnosis not present

## 2016-01-15 DIAGNOSIS — F341 Dysthymic disorder: Secondary | ICD-10-CM | POA: Diagnosis not present

## 2016-01-15 DIAGNOSIS — N181 Chronic kidney disease, stage 1: Secondary | ICD-10-CM | POA: Diagnosis not present

## 2016-01-15 DIAGNOSIS — M5126 Other intervertebral disc displacement, lumbar region: Secondary | ICD-10-CM | POA: Diagnosis not present

## 2016-01-15 DIAGNOSIS — M6281 Muscle weakness (generalized): Secondary | ICD-10-CM | POA: Diagnosis not present

## 2016-01-19 DIAGNOSIS — I129 Hypertensive chronic kidney disease with stage 1 through stage 4 chronic kidney disease, or unspecified chronic kidney disease: Secondary | ICD-10-CM | POA: Diagnosis not present

## 2016-01-19 DIAGNOSIS — E1143 Type 2 diabetes mellitus with diabetic autonomic (poly)neuropathy: Secondary | ICD-10-CM | POA: Diagnosis not present

## 2016-01-19 DIAGNOSIS — F341 Dysthymic disorder: Secondary | ICD-10-CM | POA: Diagnosis not present

## 2016-01-19 DIAGNOSIS — N181 Chronic kidney disease, stage 1: Secondary | ICD-10-CM | POA: Diagnosis not present

## 2016-01-19 DIAGNOSIS — M6281 Muscle weakness (generalized): Secondary | ICD-10-CM | POA: Diagnosis not present

## 2016-01-19 DIAGNOSIS — M5126 Other intervertebral disc displacement, lumbar region: Secondary | ICD-10-CM | POA: Diagnosis not present

## 2016-01-21 DIAGNOSIS — F341 Dysthymic disorder: Secondary | ICD-10-CM | POA: Diagnosis not present

## 2016-01-21 DIAGNOSIS — E1143 Type 2 diabetes mellitus with diabetic autonomic (poly)neuropathy: Secondary | ICD-10-CM | POA: Diagnosis not present

## 2016-01-21 DIAGNOSIS — M5126 Other intervertebral disc displacement, lumbar region: Secondary | ICD-10-CM | POA: Diagnosis not present

## 2016-01-21 DIAGNOSIS — I129 Hypertensive chronic kidney disease with stage 1 through stage 4 chronic kidney disease, or unspecified chronic kidney disease: Secondary | ICD-10-CM | POA: Diagnosis not present

## 2016-01-21 DIAGNOSIS — N181 Chronic kidney disease, stage 1: Secondary | ICD-10-CM | POA: Diagnosis not present

## 2016-01-21 DIAGNOSIS — M6281 Muscle weakness (generalized): Secondary | ICD-10-CM | POA: Diagnosis not present

## 2016-01-24 DIAGNOSIS — I129 Hypertensive chronic kidney disease with stage 1 through stage 4 chronic kidney disease, or unspecified chronic kidney disease: Secondary | ICD-10-CM | POA: Diagnosis not present

## 2016-01-24 DIAGNOSIS — N181 Chronic kidney disease, stage 1: Secondary | ICD-10-CM | POA: Diagnosis not present

## 2016-01-24 DIAGNOSIS — M6281 Muscle weakness (generalized): Secondary | ICD-10-CM | POA: Diagnosis not present

## 2016-01-24 DIAGNOSIS — F341 Dysthymic disorder: Secondary | ICD-10-CM | POA: Diagnosis not present

## 2016-01-24 DIAGNOSIS — M5126 Other intervertebral disc displacement, lumbar region: Secondary | ICD-10-CM | POA: Diagnosis not present

## 2016-01-24 DIAGNOSIS — E1143 Type 2 diabetes mellitus with diabetic autonomic (poly)neuropathy: Secondary | ICD-10-CM | POA: Diagnosis not present

## 2016-01-26 DIAGNOSIS — N181 Chronic kidney disease, stage 1: Secondary | ICD-10-CM | POA: Diagnosis not present

## 2016-01-26 DIAGNOSIS — F341 Dysthymic disorder: Secondary | ICD-10-CM | POA: Diagnosis not present

## 2016-01-26 DIAGNOSIS — E1143 Type 2 diabetes mellitus with diabetic autonomic (poly)neuropathy: Secondary | ICD-10-CM | POA: Diagnosis not present

## 2016-01-26 DIAGNOSIS — M5126 Other intervertebral disc displacement, lumbar region: Secondary | ICD-10-CM | POA: Diagnosis not present

## 2016-01-26 DIAGNOSIS — M6281 Muscle weakness (generalized): Secondary | ICD-10-CM | POA: Diagnosis not present

## 2016-01-26 DIAGNOSIS — I129 Hypertensive chronic kidney disease with stage 1 through stage 4 chronic kidney disease, or unspecified chronic kidney disease: Secondary | ICD-10-CM | POA: Diagnosis not present

## 2016-01-28 DIAGNOSIS — M5126 Other intervertebral disc displacement, lumbar region: Secondary | ICD-10-CM | POA: Diagnosis not present

## 2016-01-28 DIAGNOSIS — F341 Dysthymic disorder: Secondary | ICD-10-CM | POA: Diagnosis not present

## 2016-01-28 DIAGNOSIS — N181 Chronic kidney disease, stage 1: Secondary | ICD-10-CM | POA: Diagnosis not present

## 2016-01-28 DIAGNOSIS — M6281 Muscle weakness (generalized): Secondary | ICD-10-CM | POA: Diagnosis not present

## 2016-01-28 DIAGNOSIS — E1143 Type 2 diabetes mellitus with diabetic autonomic (poly)neuropathy: Secondary | ICD-10-CM | POA: Diagnosis not present

## 2016-01-28 DIAGNOSIS — G894 Chronic pain syndrome: Secondary | ICD-10-CM | POA: Diagnosis not present

## 2016-01-28 DIAGNOSIS — E784 Other hyperlipidemia: Secondary | ICD-10-CM | POA: Diagnosis not present

## 2016-01-28 DIAGNOSIS — I129 Hypertensive chronic kidney disease with stage 1 through stage 4 chronic kidney disease, or unspecified chronic kidney disease: Secondary | ICD-10-CM | POA: Diagnosis not present

## 2016-01-28 DIAGNOSIS — I517 Cardiomegaly: Secondary | ICD-10-CM | POA: Diagnosis not present

## 2016-02-02 DIAGNOSIS — F341 Dysthymic disorder: Secondary | ICD-10-CM | POA: Diagnosis not present

## 2016-02-02 DIAGNOSIS — E1143 Type 2 diabetes mellitus with diabetic autonomic (poly)neuropathy: Secondary | ICD-10-CM | POA: Diagnosis not present

## 2016-02-02 DIAGNOSIS — M6281 Muscle weakness (generalized): Secondary | ICD-10-CM | POA: Diagnosis not present

## 2016-02-02 DIAGNOSIS — M5126 Other intervertebral disc displacement, lumbar region: Secondary | ICD-10-CM | POA: Diagnosis not present

## 2016-02-02 DIAGNOSIS — I129 Hypertensive chronic kidney disease with stage 1 through stage 4 chronic kidney disease, or unspecified chronic kidney disease: Secondary | ICD-10-CM | POA: Diagnosis not present

## 2016-02-02 DIAGNOSIS — N181 Chronic kidney disease, stage 1: Secondary | ICD-10-CM | POA: Diagnosis not present

## 2016-02-03 DIAGNOSIS — F341 Dysthymic disorder: Secondary | ICD-10-CM | POA: Diagnosis not present

## 2016-02-03 DIAGNOSIS — E1143 Type 2 diabetes mellitus with diabetic autonomic (poly)neuropathy: Secondary | ICD-10-CM | POA: Diagnosis not present

## 2016-02-03 DIAGNOSIS — N181 Chronic kidney disease, stage 1: Secondary | ICD-10-CM | POA: Diagnosis not present

## 2016-02-03 DIAGNOSIS — M6281 Muscle weakness (generalized): Secondary | ICD-10-CM | POA: Diagnosis not present

## 2016-02-03 DIAGNOSIS — M5126 Other intervertebral disc displacement, lumbar region: Secondary | ICD-10-CM | POA: Diagnosis not present

## 2016-02-03 DIAGNOSIS — I129 Hypertensive chronic kidney disease with stage 1 through stage 4 chronic kidney disease, or unspecified chronic kidney disease: Secondary | ICD-10-CM | POA: Diagnosis not present

## 2016-02-04 DIAGNOSIS — M5126 Other intervertebral disc displacement, lumbar region: Secondary | ICD-10-CM | POA: Diagnosis not present

## 2016-02-04 DIAGNOSIS — F341 Dysthymic disorder: Secondary | ICD-10-CM | POA: Diagnosis not present

## 2016-02-04 DIAGNOSIS — E1143 Type 2 diabetes mellitus with diabetic autonomic (poly)neuropathy: Secondary | ICD-10-CM | POA: Diagnosis not present

## 2016-02-04 DIAGNOSIS — N181 Chronic kidney disease, stage 1: Secondary | ICD-10-CM | POA: Diagnosis not present

## 2016-02-04 DIAGNOSIS — M6281 Muscle weakness (generalized): Secondary | ICD-10-CM | POA: Diagnosis not present

## 2016-02-04 DIAGNOSIS — I129 Hypertensive chronic kidney disease with stage 1 through stage 4 chronic kidney disease, or unspecified chronic kidney disease: Secondary | ICD-10-CM | POA: Diagnosis not present

## 2016-02-08 DIAGNOSIS — F341 Dysthymic disorder: Secondary | ICD-10-CM | POA: Diagnosis not present

## 2016-02-08 DIAGNOSIS — N181 Chronic kidney disease, stage 1: Secondary | ICD-10-CM | POA: Diagnosis not present

## 2016-02-08 DIAGNOSIS — M5126 Other intervertebral disc displacement, lumbar region: Secondary | ICD-10-CM | POA: Diagnosis not present

## 2016-02-08 DIAGNOSIS — E1143 Type 2 diabetes mellitus with diabetic autonomic (poly)neuropathy: Secondary | ICD-10-CM | POA: Diagnosis not present

## 2016-02-08 DIAGNOSIS — I129 Hypertensive chronic kidney disease with stage 1 through stage 4 chronic kidney disease, or unspecified chronic kidney disease: Secondary | ICD-10-CM | POA: Diagnosis not present

## 2016-02-08 DIAGNOSIS — M6281 Muscle weakness (generalized): Secondary | ICD-10-CM | POA: Diagnosis not present

## 2016-02-14 DIAGNOSIS — M6281 Muscle weakness (generalized): Secondary | ICD-10-CM | POA: Diagnosis not present

## 2016-02-14 DIAGNOSIS — F341 Dysthymic disorder: Secondary | ICD-10-CM | POA: Diagnosis not present

## 2016-02-14 DIAGNOSIS — M5126 Other intervertebral disc displacement, lumbar region: Secondary | ICD-10-CM | POA: Diagnosis not present

## 2016-02-14 DIAGNOSIS — I129 Hypertensive chronic kidney disease with stage 1 through stage 4 chronic kidney disease, or unspecified chronic kidney disease: Secondary | ICD-10-CM | POA: Diagnosis not present

## 2016-02-14 DIAGNOSIS — E1143 Type 2 diabetes mellitus with diabetic autonomic (poly)neuropathy: Secondary | ICD-10-CM | POA: Diagnosis not present

## 2016-02-14 DIAGNOSIS — N181 Chronic kidney disease, stage 1: Secondary | ICD-10-CM | POA: Diagnosis not present

## 2016-04-07 DIAGNOSIS — I517 Cardiomegaly: Secondary | ICD-10-CM | POA: Diagnosis not present

## 2016-04-07 DIAGNOSIS — E1143 Type 2 diabetes mellitus with diabetic autonomic (poly)neuropathy: Secondary | ICD-10-CM | POA: Diagnosis not present

## 2016-04-07 DIAGNOSIS — E784 Other hyperlipidemia: Secondary | ICD-10-CM | POA: Diagnosis not present

## 2016-04-07 DIAGNOSIS — G894 Chronic pain syndrome: Secondary | ICD-10-CM | POA: Diagnosis not present

## 2016-04-12 ENCOUNTER — Encounter: Payer: Self-pay | Admitting: Medical Oncology

## 2016-04-12 ENCOUNTER — Emergency Department
Admission: EM | Admit: 2016-04-12 | Discharge: 2016-04-12 | Disposition: A | Discharge status: Home / Self Care | Payer: Medicare Other | Attending: Emergency Medicine | Admitting: Emergency Medicine

## 2016-04-12 ENCOUNTER — Emergency Department: Payer: Medicare Other

## 2016-04-12 DIAGNOSIS — F17210 Nicotine dependence, cigarettes, uncomplicated: Secondary | ICD-10-CM | POA: Diagnosis not present

## 2016-04-12 DIAGNOSIS — I10 Essential (primary) hypertension: Secondary | ICD-10-CM | POA: Diagnosis not present

## 2016-04-12 DIAGNOSIS — R9431 Abnormal electrocardiogram [ECG] [EKG]: Secondary | ICD-10-CM | POA: Diagnosis not present

## 2016-04-12 DIAGNOSIS — E119 Type 2 diabetes mellitus without complications: Secondary | ICD-10-CM | POA: Insufficient documentation

## 2016-04-12 DIAGNOSIS — R079 Chest pain, unspecified: Secondary | ICD-10-CM | POA: Diagnosis not present

## 2016-04-12 DIAGNOSIS — G8929 Other chronic pain: Secondary | ICD-10-CM | POA: Diagnosis not present

## 2016-04-12 DIAGNOSIS — M797 Fibromyalgia: Secondary | ICD-10-CM | POA: Diagnosis not present

## 2016-04-12 DIAGNOSIS — Z7984 Long term (current) use of oral hypoglycemic drugs: Secondary | ICD-10-CM | POA: Diagnosis not present

## 2016-04-12 DIAGNOSIS — M542 Cervicalgia: Secondary | ICD-10-CM

## 2016-04-12 DIAGNOSIS — Z79899 Other long term (current) drug therapy: Secondary | ICD-10-CM | POA: Insufficient documentation

## 2016-04-12 DIAGNOSIS — M545 Low back pain: Secondary | ICD-10-CM | POA: Insufficient documentation

## 2016-04-12 DIAGNOSIS — R300 Dysuria: Secondary | ICD-10-CM | POA: Insufficient documentation

## 2016-04-12 LAB — URINALYSIS, ROUTINE W REFLEX MICROSCOPIC
BACTERIA UA: NONE SEEN
Bilirubin Urine: NEGATIVE
Hgb urine dipstick: NEGATIVE
KETONES UR: NEGATIVE mg/dL
NITRITE: NEGATIVE
Specific Gravity, Urine: 1.017 (ref 1.005–1.030)
pH: 5 (ref 5.0–8.0)

## 2016-04-12 LAB — CBC WITH DIFFERENTIAL/PLATELET
BASOS ABS: 0.1 10*3/uL (ref 0–0.1)
BASOS PCT: 1 %
Eosinophils Absolute: 0.3 10*3/uL (ref 0–0.7)
Eosinophils Relative: 2 %
HEMATOCRIT: 41.7 % (ref 35.0–47.0)
HEMOGLOBIN: 14.7 g/dL (ref 12.0–16.0)
LYMPHS PCT: 21 %
Lymphs Abs: 2.3 10*3/uL (ref 1.0–3.6)
MCH: 31.8 pg (ref 26.0–34.0)
MCHC: 35.3 g/dL (ref 32.0–36.0)
MCV: 90.2 fL (ref 80.0–100.0)
Monocytes Absolute: 0.7 10*3/uL (ref 0.2–0.9)
Monocytes Relative: 6 %
NEUTROS ABS: 7.9 10*3/uL — AB (ref 1.4–6.5)
NEUTROS PCT: 70 %
Platelets: 262 10*3/uL (ref 150–440)
RBC: 4.62 MIL/uL (ref 3.80–5.20)
RDW: 13 % (ref 11.5–14.5)
WBC: 11.2 10*3/uL — AB (ref 3.6–11.0)

## 2016-04-12 LAB — GLUCOSE, CAPILLARY: Glucose-Capillary: 100 mg/dL — ABNORMAL HIGH (ref 65–99)

## 2016-04-12 LAB — COMPREHENSIVE METABOLIC PANEL WITH GFR
ALT: 14 U/L (ref 14–54)
AST: 21 U/L (ref 15–41)
Albumin: 2.8 g/dL — ABNORMAL LOW (ref 3.5–5.0)
Alkaline Phosphatase: 82 U/L (ref 38–126)
Anion gap: 10 (ref 5–15)
BUN: 21 mg/dL — ABNORMAL HIGH (ref 6–20)
CO2: 26 mmol/L (ref 22–32)
Calcium: 9.1 mg/dL (ref 8.9–10.3)
Chloride: 102 mmol/L (ref 101–111)
Creatinine, Ser: 0.61 mg/dL (ref 0.44–1.00)
GFR calc Af Amer: >60 mL/min
GFR calc non Af Amer: >60 mL/min
Glucose, Bld: 97 mg/dL (ref 65–99)
Potassium: 3.3 mmol/L — ABNORMAL LOW (ref 3.5–5.1)
Sodium: 138 mmol/L (ref 135–145)
Total Bilirubin: 0.4 mg/dL (ref 0.3–1.2)
Total Protein: 6.3 g/dL — ABNORMAL LOW (ref 6.5–8.1)

## 2016-04-12 LAB — TROPONIN I: TROPONIN I: 0.03 ng/mL — AB (ref <0.03)

## 2016-04-12 LAB — LACTIC ACID, PLASMA: Lactic Acid, Venous: 1.9 mmol/L (ref 0.5–1.9)

## 2016-04-12 NOTE — ED Triage Notes (Signed)
Pt reports rt lower back pain that began 2 days ago, pt also reports pain under each armpit.

## 2016-04-12 NOTE — Discharge Instructions (Signed)
Continue regular medication as prescribed by your primary care doctor. Follow-up with Dr. Lavera Guise if any continued problems. Continue taking your Percocet as directed.

## 2016-04-12 NOTE — ED Provider Notes (Signed)
[facility] Emergency Department Provider Note   ____________________________________________   First MD Initiated Contact with Patient 04/12/16 1056     (approximate)  I have reviewed the triage vital signs and the nursing notes.   HISTORY  Chief Complaint Back Pain    HPI Colleen Peters is a 56 y.o. female history with complaint of right lower back pain that began 2 days ago. Patient states that she also has pain under each armpit that began at approximately same time. Patient has noticed some urinary difficulty and has had some generalized body aches. She is unaware of any fever or chills. She denies any headache. She has no upper respiratory symptoms, nausea, vomiting or diarrhea. Patient states she has a history of fibromyalgia and hurts every day. She also has history of chronic back pain. Patient states that she saw her PCP on Friday and was told to "go home and take it easy". Patient interpreted this as her blood pressure was low and that she should discontinue taking her lisinopril and hydrochlorothiazide. She has continued to take her diabetes medication. Patient does not check her blood sugars at home. Patient states they generally run high. She denies any chest pain, shortness of breath, diaphoresis with her symptoms. Currently she rates her pain as an 8 out of 10.   Past Medical History:  Diagnosis Date  . Diabetes mellitus without complication ([place])   . Hypertension   . Leg cramping   . Nerve pain     There are no active problems to display for this patient.   Past Surgical History:  Procedure Laterality Date  . ABDOMINAL HYSTERECTOMY    . APPENDECTOMY    . BACK SURGERY    . bone spur disease    . CHOLECYSTECTOMY    . lt elebow surgery  2008  . ovarian surgery for cyst    . rt great toe surgery      Prior to Admission medications   Medication Sig Start Date End Date Taking? Authorizing Provider  cyclobenzaprine (FLEXERIL) 10  MG tablet Take 10 mg by mouth 3 (three) times daily as needed for muscle spasms.   Yes Historical Provider, MD  diazepam (VALIUM) 5 MG tablet Take 5 mg by mouth every 6 (six) hours as needed for anxiety.   Yes Historical Provider, MD  gabapentin (NEURONTIN) 300 MG capsule Take 300 mg by mouth 3 (three) times daily.   Yes Historical Provider, MD  hydrochlorothiazide (HYDRODIURIL) 25 MG tablet Take 25 mg by mouth daily.   Yes Historical Provider, MD  insulin aspart protamine- aspart (NOVOLOG MIX 70/30) (70-30) 100 UNIT/ML injection Inject into the skin.   Yes Historical Provider, MD  lisinopril (PRINIVIL,ZESTRIL) 20 MG tablet Take 20 mg by mouth daily.   Yes Historical Provider, MD  meloxicam (MOBIC) 15 MG tablet Take 15 mg by mouth daily.   Yes Historical Provider, MD  metFORMIN (GLUCOPHAGE) 500 MG tablet Take 500 mg by mouth 2 (two) times daily with a meal.   Yes Historical Provider, MD  metoprolol succinate (TOPROL-XL) 25 MG 24 hr tablet Take 25 mg by mouth daily.   Yes Historical Provider, MD  oxyCODONE-acetaminophen (PERCOCET) 7.5-325 MG tablet Take 1 tablet by mouth every 4 (four) hours as needed for severe pain.   Yes Historical Provider, MD  simvastatin (ZOCOR) 20 MG tablet Take 20 mg by mouth daily.   Yes Historical Provider, MD    Allergies Bactrim [sulfamethoxazole-trimethoprim] and Relafen [nabumetone]  No family history  on file.  Social History Social History  Substance Use Topics  . Smoking status: Current Every Day Smoker    Packs/day: 1.00    Types: Cigarettes  . Smokeless tobacco: Never Used  . Alcohol use No    Review of Systems Constitutional: No fever/chills Eyes: No visual changes. ENT: No sore throat. Positive for left-sided neck pain. Cardiovascular: Denies chest pain. Respiratory: Denies shortness of breath. Gastrointestinal: No abdominal pain.  No nausea, no vomiting.  No diarrhea.  No constipation. Genitourinary: Positive for dysuria. Musculoskeletal:  Positive for chronic back pain. Skin: Negative for rash. Neurological: Negative for headaches, focal weakness or numbness. Endocrine:Diabetes positive   10-point ROS otherwise negative.  ____________________________________________   PHYSICAL EXAM:  VITAL SIGNS: ED Triage Vitals  Enc Vitals Group     BP 04/12/16 1029 (!) 188/82     Pulse Rate 04/12/16 1029 100     Resp 04/12/16 1029 20     Temp 04/12/16 1030 98.3 F (36.8 C)     Temp Source 04/12/16 1030 Oral     SpO2 04/12/16 1029 97 %     Weight 04/12/16 1029 163 lb (73.9 kg)     Height 04/12/16 1029 5\' 6"  (1.676 m)     Head Circumference --      Peak Flow --      Pain Score 04/12/16 1030 8     Pain Loc --      Pain Edu? --      Excl. in [place]? --     Constitutional: Alert and oriented. Well appearing and in no acute distress. Eyes: Conjunctivae are normal. PERRL. EOMI. Head: Atraumatic. Nose: No congestion/rhinnorhea. Mouth/Throat: Mucous membranes are moist.  Oropharynx non-erythematous. Neck: No stridor.  Nontender cervical spine posteriorly. Cardiovascular: Normal rate, regular rhythm. Grossly normal heart sounds.  Good peripheral circulation. Respiratory: Normal respiratory effort.  No retractions. Lungs CTAB. Nontender chest to palpation. Gastrointestinal: Soft and nontender. No distention. No CVA tenderness. Musculoskeletal: Examination back there is no gross deformity noted. There is generalized diffuse tenderness lower lumbar and sacral region. No active muscle spasms are seen. Reflexes are equal bilaterally. Range of motion is slow and guarded secondary to pain. Patient is able to sit and stand.  Patient also has generalized tenderness on multiple areas. There is no cervical deformity. There is some minimal tenderness on palpation of the cervical muscles bilaterally. Range of motion is unrestricted. Neurologic:  Normal speech and language. No gross focal neurologic deficits are appreciated. No gait  instability. Skin:  Skin is warm, dry and intact. No rash noted. Psychiatric: Mood and affect are normal. Speech and behavior are normal.  ____________________________________________   LABS (all labs ordered are listed, but only abnormal results are displayed)  Labs Reviewed  URINALYSIS, ROUTINE W REFLEX MICROSCOPIC - Abnormal; Notable for the following:       Result Value   Color, Urine YELLOW (*)    APPearance CLEAR (*)    Glucose, UA >=500 (*)    Protein, ur >=300 (*)    Leukocytes, UA TRACE (*)    Squamous Epithelial / LPF 0-5 (*)    All other components within normal limits  CBC WITH DIFFERENTIAL/PLATELET - Abnormal; Notable for the following:    WBC 11.2 (*)    Neutro Abs 7.9 (*)    All other components within normal limits  COMPREHENSIVE METABOLIC PANEL - Abnormal; Notable for the following:    Potassium 3.3 (*)    BUN 21 (*)    Total  Protein 6.3 (*)    Albumin 2.8 (*)    All other components within normal limits  TROPONIN I - Abnormal; Notable for the following:    Troponin I 0.03 (*)    All other components within normal limits  GLUCOSE, CAPILLARY - Abnormal; Notable for the following:    Glucose-Capillary 100 (*)    All other components within normal limits  LACTIC ACID, PLASMA  TROPONIN I  CBG MONITORING, ED   ____________________________________________  EKG Dr. Joni Fears compared with old EKGs on file in the emergency room. ____________________________________________  RADIOLOGY  Chest x-ray per radiologist is negative for acute findings. I, Johnn Hai, personally viewed and evaluated these images (plain radiographs) as part of my medical decision making, as well as reviewing the written report by the radiologist. ____________________________________________   PROCEDURES  Procedure(s) performed: None  Procedures  Critical Care performed: No  ____________________________________________   INITIAL IMPRESSION / ASSESSMENT AND PLAN / ED  COURSE  Pertinent labs & imaging results that were available during my care of the patient were reviewed by me and considered in my medical decision making (see chart for details).  Patient was made aware that her lab work did not show any infection nor did her chest x-ray showed pneumonia. Second troponin was negative. Patient then gave history of taking a Percocet). Patient has been breaking her Percocets and half to make them last longer. Patient states that her PCP has cut her Percocet use from 4 tablets a day to 2 tablets per day. Patient is follow-up with her PCP if any continued problems. Patient with history of chronic low back pain and history of fibromyalgia. Husband was here to pick patient up at time of discharge.      ____________________________________________   FINAL CLINICAL IMPRESSION(S) / ED DIAGNOSES  Final diagnoses:  Chronic midline low back pain without sciatica  Muscle pain, fibromyalgia  Cervical muscle pain      NEW MEDICATIONS STARTED DURING THIS VISIT:  Discharge Medication List as of 04/12/2016  6:28 PM       Note:  This document was prepared using Dragon voice recognition software and may include unintentional dictation errors.    Johnn Hai, PA-C 04/12/16 2028    Johnn Hai, PA-C 04/12/16 2029    Schuyler Amor, MD 04/20/16 [phone]

## 2016-04-12 NOTE — ED Notes (Signed)
See triage note   States she developed lower back pain with some urinary diff  Unsure of fever   Also has had generalized body aches

## 2016-04-12 NOTE — ED Provider Notes (Signed)
EKG interpreted by me Sinus rhythm rate of 92, rightward axis, normal intervals. Nonspecific interventricular conduction delay. One PVC on the strip. Normal ST segments. T wave inversions in V4 through V6. EKG not significantly changed from previous EKG August 2017.   Carrie Mew, MD 04/12/16 [phone]

## 2016-04-28 DIAGNOSIS — E1143 Type 2 diabetes mellitus with diabetic autonomic (poly)neuropathy: Secondary | ICD-10-CM | POA: Diagnosis not present

## 2016-04-28 DIAGNOSIS — R42 Dizziness and giddiness: Secondary | ICD-10-CM | POA: Diagnosis not present

## 2016-04-28 DIAGNOSIS — R5381 Other malaise: Secondary | ICD-10-CM | POA: Diagnosis not present

## 2016-04-28 DIAGNOSIS — I517 Cardiomegaly: Secondary | ICD-10-CM | POA: Diagnosis not present

## 2016-04-28 DIAGNOSIS — G894 Chronic pain syndrome: Secondary | ICD-10-CM | POA: Diagnosis not present

## 2016-06-08 DIAGNOSIS — R112 Nausea with vomiting, unspecified: Secondary | ICD-10-CM | POA: Diagnosis not present

## 2016-06-08 DIAGNOSIS — E784 Other hyperlipidemia: Secondary | ICD-10-CM | POA: Diagnosis not present

## 2016-06-08 DIAGNOSIS — M5440 Lumbago with sciatica, unspecified side: Secondary | ICD-10-CM | POA: Diagnosis not present

## 2016-06-08 DIAGNOSIS — L03312 Cellulitis of back [any part except buttock]: Secondary | ICD-10-CM | POA: Diagnosis not present

## 2016-07-07 DIAGNOSIS — G8929 Other chronic pain: Secondary | ICD-10-CM | POA: Diagnosis not present

## 2016-07-07 DIAGNOSIS — M5440 Lumbago with sciatica, unspecified side: Secondary | ICD-10-CM | POA: Diagnosis not present

## 2016-07-07 DIAGNOSIS — E784 Other hyperlipidemia: Secondary | ICD-10-CM | POA: Diagnosis not present

## 2016-07-07 DIAGNOSIS — M549 Dorsalgia, unspecified: Secondary | ICD-10-CM | POA: Diagnosis not present

## 2016-08-04 DIAGNOSIS — M6281 Muscle weakness (generalized): Secondary | ICD-10-CM | POA: Diagnosis not present

## 2016-08-04 DIAGNOSIS — M5440 Lumbago with sciatica, unspecified side: Secondary | ICD-10-CM | POA: Diagnosis not present

## 2016-08-04 DIAGNOSIS — Z87891 Personal history of nicotine dependence: Secondary | ICD-10-CM | POA: Diagnosis not present

## 2016-08-04 DIAGNOSIS — F341 Dysthymic disorder: Secondary | ICD-10-CM | POA: Diagnosis not present

## 2016-09-04 DIAGNOSIS — H2513 Age-related nuclear cataract, bilateral: Secondary | ICD-10-CM | POA: Diagnosis not present

## 2016-09-04 DIAGNOSIS — H018 Other specified inflammations of eyelid: Secondary | ICD-10-CM | POA: Diagnosis not present

## 2016-09-04 DIAGNOSIS — E113213 Type 2 diabetes mellitus with mild nonproliferative diabetic retinopathy with macular edema, bilateral: Secondary | ICD-10-CM | POA: Diagnosis not present

## 2016-09-04 DIAGNOSIS — C44119 Basal cell carcinoma of skin of left eyelid, including canthus: Secondary | ICD-10-CM | POA: Diagnosis not present

## 2016-09-04 DIAGNOSIS — H4389 Other disorders of vitreous body: Secondary | ICD-10-CM | POA: Diagnosis not present

## 2016-09-06 DIAGNOSIS — E133293 Other specified diabetes mellitus with mild nonproliferative diabetic retinopathy without macular edema, bilateral: Secondary | ICD-10-CM | POA: Diagnosis not present

## 2016-09-06 DIAGNOSIS — H4311 Vitreous hemorrhage, right eye: Secondary | ICD-10-CM | POA: Diagnosis not present

## 2016-09-06 DIAGNOSIS — H018 Other specified inflammations of eyelid: Secondary | ICD-10-CM | POA: Diagnosis not present

## 2016-09-06 DIAGNOSIS — C44119 Basal cell carcinoma of skin of left eyelid, including canthus: Secondary | ICD-10-CM | POA: Diagnosis not present

## 2016-09-06 DIAGNOSIS — H2513 Age-related nuclear cataract, bilateral: Secondary | ICD-10-CM | POA: Diagnosis not present

## 2016-09-08 DIAGNOSIS — H109 Unspecified conjunctivitis: Secondary | ICD-10-CM | POA: Diagnosis not present

## 2016-09-08 DIAGNOSIS — H018 Other specified inflammations of eyelid: Secondary | ICD-10-CM | POA: Diagnosis not present

## 2016-09-08 DIAGNOSIS — C44119 Basal cell carcinoma of skin of left eyelid, including canthus: Secondary | ICD-10-CM | POA: Diagnosis not present

## 2016-09-08 DIAGNOSIS — H2513 Age-related nuclear cataract, bilateral: Secondary | ICD-10-CM | POA: Diagnosis not present

## 2016-09-08 DIAGNOSIS — H1012 Acute atopic conjunctivitis, left eye: Secondary | ICD-10-CM | POA: Diagnosis not present

## 2016-09-08 DIAGNOSIS — F341 Dysthymic disorder: Secondary | ICD-10-CM | POA: Diagnosis not present

## 2016-09-08 DIAGNOSIS — M5440 Lumbago with sciatica, unspecified side: Secondary | ICD-10-CM | POA: Diagnosis not present

## 2016-09-08 DIAGNOSIS — H3520 Other non-diabetic proliferative retinopathy, unspecified eye: Secondary | ICD-10-CM | POA: Diagnosis not present

## 2016-09-08 DIAGNOSIS — E133213 Other specified diabetes mellitus with mild nonproliferative diabetic retinopathy with macular edema, bilateral: Secondary | ICD-10-CM | POA: Diagnosis not present

## 2016-09-20 ENCOUNTER — Encounter: Payer: Self-pay | Admitting: *Deleted

## 2016-09-20 DIAGNOSIS — F329 Major depressive disorder, single episode, unspecified: Secondary | ICD-10-CM | POA: Diagnosis not present

## 2016-09-20 DIAGNOSIS — E119 Type 2 diabetes mellitus without complications: Secondary | ICD-10-CM | POA: Insufficient documentation

## 2016-09-20 DIAGNOSIS — F17210 Nicotine dependence, cigarettes, uncomplicated: Secondary | ICD-10-CM | POA: Diagnosis not present

## 2016-09-20 DIAGNOSIS — R45851 Suicidal ideations: Secondary | ICD-10-CM | POA: Diagnosis not present

## 2016-09-20 DIAGNOSIS — I10 Essential (primary) hypertension: Secondary | ICD-10-CM | POA: Insufficient documentation

## 2016-09-20 DIAGNOSIS — Z791 Long term (current) use of non-steroidal anti-inflammatories (NSAID): Secondary | ICD-10-CM | POA: Insufficient documentation

## 2016-09-20 DIAGNOSIS — Z794 Long term (current) use of insulin: Secondary | ICD-10-CM | POA: Diagnosis not present

## 2016-09-20 DIAGNOSIS — Z79899 Other long term (current) drug therapy: Secondary | ICD-10-CM | POA: Diagnosis not present

## 2016-09-20 DIAGNOSIS — Z7984 Long term (current) use of oral hypoglycemic drugs: Secondary | ICD-10-CM | POA: Insufficient documentation

## 2016-09-20 LAB — CBC
HCT: 49.7 % — ABNORMAL HIGH (ref 35.0–47.0)
Hemoglobin: 17 g/dL — ABNORMAL HIGH (ref 12.0–16.0)
MCH: 31.6 pg (ref 26.0–34.0)
MCHC: 34.2 g/dL (ref 32.0–36.0)
MCV: 92.6 fL (ref 80.0–100.0)
PLATELETS: 253 10*3/uL (ref 150–440)
RBC: 5.37 MIL/uL — AB (ref 3.80–5.20)
RDW: 13.4 % (ref 11.5–14.5)
WBC: 15.6 10*3/uL — AB (ref 3.6–11.0)

## 2016-09-20 NOTE — ED Triage Notes (Signed)
Pt brought in by [facility] de[facility].  Pt reports feeling depressed.  Denies etoh use or drug use.  Pt denies HI.  Pt denies SI.  Pt calm and cooperative.

## 2016-09-21 ENCOUNTER — Emergency Department
Admission: EM | Admit: 2016-09-21 | Discharge: 2016-09-21 | Disposition: A | Discharge status: Home / Self Care | Payer: Medicare Other | Attending: Emergency Medicine | Admitting: Emergency Medicine

## 2016-09-21 DIAGNOSIS — F329 Major depressive disorder, single episode, unspecified: Secondary | ICD-10-CM

## 2016-09-21 DIAGNOSIS — R45851 Suicidal ideations: Secondary | ICD-10-CM

## 2016-09-21 DIAGNOSIS — F32A Depression, unspecified: Secondary | ICD-10-CM

## 2016-09-21 LAB — COMPREHENSIVE METABOLIC PANEL
ALBUMIN: 3.2 g/dL — AB (ref 3.5–5.0)
ALK PHOS: 108 U/L (ref 38–126)
ALT: 16 U/L (ref 14–54)
ANION GAP: 12 (ref 5–15)
AST: 21 U/L (ref 15–41)
BUN: 23 mg/dL — ABNORMAL HIGH (ref 6–20)
CALCIUM: 9 mg/dL (ref 8.9–10.3)
CO2: 26 mmol/L (ref 22–32)
CREATININE: 1.32 mg/dL — AB (ref 0.44–1.00)
Chloride: 100 mmol/L — ABNORMAL LOW (ref 101–111)
GFR calc Af Amer: 51 mL/min — ABNORMAL LOW (ref >60)
GFR calc non Af Amer: 44 mL/min — ABNORMAL LOW (ref >60)
GLUCOSE: 360 mg/dL — AB (ref 65–99)
Potassium: 3.6 mmol/L (ref 3.5–5.1)
SODIUM: 138 mmol/L (ref 135–145)
Total Bilirubin: 0.5 mg/dL (ref 0.3–1.2)
Total Protein: 6.8 g/dL (ref 6.5–8.1)

## 2016-09-21 LAB — URINE DRUG SCREEN, QUALITATIVE (ARMC ONLY)
AMPHETAMINES, UR SCREEN: NOT DETECTED
Barbiturates, Ur Screen: NOT DETECTED
Benzodiazepine, Ur Scrn: NOT DETECTED
Cannabinoid 50 Ng, Ur ~~LOC~~: NOT DETECTED
Cocaine Metabolite,Ur ~~LOC~~: NOT DETECTED
MDMA (ECSTASY) UR SCREEN: NOT DETECTED
Methadone Scn, Ur: NOT DETECTED
Opiate, Ur Screen: NOT DETECTED
PHENCYCLIDINE (PCP) UR S: NOT DETECTED
Tricyclic, Ur Screen: NOT DETECTED

## 2016-09-21 LAB — TROPONIN I
TROPONIN I: 0.04 ng/mL — AB (ref <0.03)
Troponin I: 0.03 ng/mL (ref <0.03)

## 2016-09-21 LAB — ACETAMINOPHEN LEVEL

## 2016-09-21 LAB — ETHANOL: Alcohol, Ethyl (B): <5 mg/dL (ref <5)

## 2016-09-21 LAB — GLUCOSE, CAPILLARY: Glucose-Capillary: 304 mg/dL — ABNORMAL HIGH (ref 65–99)

## 2016-09-21 LAB — SALICYLATE LEVEL: Salicylate Lvl: <7 mg/dL (ref 2.8–30.0)

## 2016-09-21 MED ORDER — SODIUM CHLORIDE 0.9 % IV BOLUS (SEPSIS)
1000.0000 mL | Freq: Once | INTRAVENOUS | Status: AC
  Administered 2016-09-21: 1000 mL via INTRAVENOUS

## 2016-09-21 NOTE — ED Provider Notes (Signed)
The specialist on-call feels the patient is stable for discharge home.   Darel Hong, MD 09/21/16 [phone]

## 2016-09-21 NOTE — ED Notes (Signed)
Patient in room 23 talking to soc

## 2016-09-21 NOTE — Discharge Instructions (Signed)
Please establish care with a psychiatrist as an outpatient and return to the emergency department for any concerns.  It was a pleasure to take care of you today, and thank you for coming to our emergency department.  If you have any questions or concerns before leaving please ask the nurse to grab me and I'm more than happy to go through your aftercare instructions again.  If you were prescribed any opioid pain medication today such as Norco, Vicodin, Percocet, morphine, hydrocodone, or oxycodone please make sure you do not drive when you are taking this medication as it can alter your ability to drive safely.  If you have any concerns once you are home that you are not improving or are in fact getting worse before you can make it to your follow-up appointment, please do not hesitate to call [phone] and come back for further evaluation.  Charlesetta Ivory, MD  Results for orders placed or performed during the hospital encounter of 09/21/16  Comprehensive metabolic panel  Result Value Ref Range   Sodium 138 135 - 145 mmol/L   Potassium 3.6 3.5 - 5.1 mmol/L   Chloride 100 (L) 101 - 111 mmol/L   CO2 26 22 - 32 mmol/L   Glucose, Bld 360 (H) 65 - 99 mg/dL   BUN 23 (H) 6 - 20 mg/dL   Creatinine, Ser 1.32 (H) 0.44 - 1.00 mg/dL   Calcium 9.0 8.9 - 10.3 mg/dL   Total Protein 6.8 6.5 - 8.1 g/dL   Albumin 3.2 (L) 3.5 - 5.0 g/dL   AST 21 15 - 41 U/L   ALT 16 14 - 54 U/L   Alkaline Phosphatase 108 38 - 126 U/L   Total Bilirubin 0.5 0.3 - 1.2 mg/dL   GFR calc non Af Amer 44 (L) >60 mL/min   GFR calc Af Amer 51 (L) >60 mL/min   Anion gap 12 5 - 15  Ethanol  Result Value Ref Range   Alcohol, Ethyl (B) <5 <5 mg/dL  Salicylate level  Result Value Ref Range   Salicylate Lvl <6.9 2.8 - 30.0 mg/dL  Acetaminophen level  Result Value Ref Range   Acetaminophen (Tylenol), Serum <10 (L) 10 - 30 ug/mL  cbc  Result Value Ref Range   WBC 15.6 (H) 3.6 - 11.0 K/uL   RBC 5.37 (H) 3.80 - 5.20 MIL/uL   Hemoglobin  17.0 (H) 12.0 - 16.0 g/dL   HCT 49.7 (H) 35.0 - 47.0 %   MCV 92.6 80.0 - 100.0 fL   MCH 31.6 26.0 - 34.0 pg   MCHC 34.2 32.0 - 36.0 g/dL   RDW 13.4 11.5 - 14.5 %   Platelets 253 150 - 440 K/uL  Urine Drug Screen, Qualitative  Result Value Ref Range   Tricyclic, Ur Screen NONE DETECTED NONE DETECTED   Amphetamines, Ur Screen NONE DETECTED NONE DETECTED   MDMA (Ecstasy)Ur Screen NONE DETECTED NONE DETECTED   Cocaine Metabolite,Ur [place] NONE DETECTED NONE DETECTED   Opiate, Ur Screen NONE DETECTED NONE DETECTED   Phencyclidine (PCP) Ur S NONE DETECTED NONE DETECTED   Cannabinoid 50 Ng, Ur [place] NONE DETECTED NONE DETECTED   Barbiturates, Ur Screen NONE DETECTED NONE DETECTED   Benzodiazepine, Ur Scrn NONE DETECTED NONE DETECTED   Methadone Scn, Ur NONE DETECTED NONE DETECTED  Troponin I  Result Value Ref Range   Troponin I 0.04 (HH) <0.03 ng/mL  Troponin I  Result Value Ref Range   Troponin I 0.03 (HH) <0.03 ng/mL  Glucose,  capillary  Result Value Ref Range   Glucose-Capillary 304 (H) 65 - 99 mg/dL

## 2016-09-21 NOTE — ED Notes (Signed)
SOC completed by Dr.Breuer/ Pt D/C

## 2016-09-21 NOTE — ED Notes (Signed)
SOC called for pt to be placed in front of camera - pt ambulated to room 23 and camera set up - pt verbalizes that she wishes she could just get away from here and buy a one way ticket to [place]

## 2016-09-21 NOTE — ED Notes (Signed)
Lab called to report a high troponin of 0.04 Dr. Dahlia Client was notified.

## 2016-09-21 NOTE — ED Notes (Signed)
Pt attempted to call spouse with no answer and then called a friend that is going to attempt to find her a ride home and will call back if she finds one
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
## 2016-09-21 NOTE — ED Notes (Addendum)
[facility] doctor called and obtained history and stated that he would be seeing pt now

## 2016-09-21 NOTE — ED Notes (Signed)
Pt is requesting to go home - she states that she is here voluntarily and that she would like to leave voluntarily - pt has not had SOC yet - spoke to Dr Dahlia Client and she is willing to IVC pt if necessary to make sure that pt sees the [facility] doctor  D/t pt had a plan of taking to much insulin to kill herself and pt reported that she wanted to die yesterday - spoke to pt and convinced her to stay long enough to speak to the [facility] provider about what occurred yesterday - gave pt cup of water and repositioned pt - she agrees to stay and speak to [facility] - Dr Dahlia Client notified

## 2016-10-02 DIAGNOSIS — M5440 Lumbago with sciatica, unspecified side: Secondary | ICD-10-CM | POA: Diagnosis not present

## 2016-10-02 DIAGNOSIS — F341 Dysthymic disorder: Secondary | ICD-10-CM | POA: Diagnosis not present

## 2016-10-02 DIAGNOSIS — M6281 Muscle weakness (generalized): Secondary | ICD-10-CM | POA: Diagnosis not present

## 2016-10-03 ENCOUNTER — Encounter (INDEPENDENT_AMBULATORY_CARE_PROVIDER_SITE_OTHER): Payer: Self-pay | Admitting: Ophthalmology

## 2016-11-06 DIAGNOSIS — M728 Other fibroblastic disorders: Secondary | ICD-10-CM | POA: Diagnosis not present

## 2016-11-06 DIAGNOSIS — I10 Essential (primary) hypertension: Secondary | ICD-10-CM | POA: Diagnosis not present

## 2016-11-06 DIAGNOSIS — Z87891 Personal history of nicotine dependence: Secondary | ICD-10-CM | POA: Diagnosis not present

## 2016-11-06 DIAGNOSIS — E119 Type 2 diabetes mellitus without complications: Secondary | ICD-10-CM | POA: Diagnosis not present

## 2016-11-07 DIAGNOSIS — H4313 Vitreous hemorrhage, bilateral: Secondary | ICD-10-CM | POA: Diagnosis not present

## 2016-11-07 DIAGNOSIS — H3582 Retinal ischemia: Secondary | ICD-10-CM | POA: Diagnosis not present

## 2016-11-07 DIAGNOSIS — H2513 Age-related nuclear cataract, bilateral: Secondary | ICD-10-CM | POA: Diagnosis not present

## 2016-11-07 DIAGNOSIS — E113513 Type 2 diabetes mellitus with proliferative diabetic retinopathy with macular edema, bilateral: Secondary | ICD-10-CM | POA: Diagnosis not present

## 2016-11-29 DIAGNOSIS — H2513 Age-related nuclear cataract, bilateral: Secondary | ICD-10-CM | POA: Diagnosis not present

## 2016-11-29 DIAGNOSIS — E113513 Type 2 diabetes mellitus with proliferative diabetic retinopathy with macular edema, bilateral: Secondary | ICD-10-CM | POA: Diagnosis not present

## 2016-11-29 DIAGNOSIS — H25013 Cortical age-related cataract, bilateral: Secondary | ICD-10-CM | POA: Diagnosis not present

## 2016-11-29 DIAGNOSIS — H4313 Vitreous hemorrhage, bilateral: Secondary | ICD-10-CM | POA: Diagnosis not present

## 2016-11-29 DIAGNOSIS — H35051 Retinal neovascularization, unspecified, right eye: Secondary | ICD-10-CM | POA: Diagnosis not present

## 2016-11-29 DIAGNOSIS — H2511 Age-related nuclear cataract, right eye: Secondary | ICD-10-CM | POA: Diagnosis not present

## 2016-12-06 DIAGNOSIS — M728 Other fibroblastic disorders: Secondary | ICD-10-CM | POA: Diagnosis not present

## 2016-12-06 DIAGNOSIS — F341 Dysthymic disorder: Secondary | ICD-10-CM | POA: Diagnosis not present

## 2016-12-06 DIAGNOSIS — G894 Chronic pain syndrome: Secondary | ICD-10-CM | POA: Diagnosis not present

## 2016-12-06 DIAGNOSIS — E119 Type 2 diabetes mellitus without complications: Secondary | ICD-10-CM | POA: Diagnosis not present

## 2017-01-11 DIAGNOSIS — E119 Type 2 diabetes mellitus without complications: Secondary | ICD-10-CM | POA: Diagnosis not present

## 2017-01-11 DIAGNOSIS — F341 Dysthymic disorder: Secondary | ICD-10-CM | POA: Diagnosis not present

## 2017-01-11 DIAGNOSIS — G894 Chronic pain syndrome: Secondary | ICD-10-CM | POA: Diagnosis not present

## 2017-01-11 DIAGNOSIS — M728 Other fibroblastic disorders: Secondary | ICD-10-CM | POA: Diagnosis not present

## 2017-01-30 DIAGNOSIS — H2511 Age-related nuclear cataract, right eye: Secondary | ICD-10-CM | POA: Diagnosis not present

## 2017-01-30 DIAGNOSIS — H25811 Combined forms of age-related cataract, right eye: Secondary | ICD-10-CM | POA: Diagnosis not present

## 2017-02-09 DIAGNOSIS — H2511 Age-related nuclear cataract, right eye: Secondary | ICD-10-CM | POA: Diagnosis not present

## 2017-02-15 DIAGNOSIS — E119 Type 2 diabetes mellitus without complications: Secondary | ICD-10-CM | POA: Diagnosis not present

## 2017-02-15 DIAGNOSIS — F341 Dysthymic disorder: Secondary | ICD-10-CM | POA: Diagnosis not present

## 2017-02-15 DIAGNOSIS — G894 Chronic pain syndrome: Secondary | ICD-10-CM | POA: Diagnosis not present

## 2017-02-15 DIAGNOSIS — M728 Other fibroblastic disorders: Secondary | ICD-10-CM | POA: Diagnosis not present

## 2017-02-28 DIAGNOSIS — H3582 Retinal ischemia: Secondary | ICD-10-CM | POA: Diagnosis not present

## 2017-02-28 DIAGNOSIS — E113513 Type 2 diabetes mellitus with proliferative diabetic retinopathy with macular edema, bilateral: Secondary | ICD-10-CM | POA: Diagnosis not present

## 2017-02-28 DIAGNOSIS — H4313 Vitreous hemorrhage, bilateral: Secondary | ICD-10-CM | POA: Diagnosis not present

## 2017-03-21 DIAGNOSIS — R0789 Other chest pain: Secondary | ICD-10-CM | POA: Diagnosis not present

## 2017-03-21 DIAGNOSIS — E119 Type 2 diabetes mellitus without complications: Secondary | ICD-10-CM | POA: Diagnosis not present

## 2017-03-21 DIAGNOSIS — G894 Chronic pain syndrome: Secondary | ICD-10-CM | POA: Diagnosis not present

## 2017-03-21 DIAGNOSIS — M728 Other fibroblastic disorders: Secondary | ICD-10-CM | POA: Diagnosis not present

## 2017-03-21 DIAGNOSIS — F341 Dysthymic disorder: Secondary | ICD-10-CM | POA: Diagnosis not present

## 2017-03-27 DIAGNOSIS — M728 Other fibroblastic disorders: Secondary | ICD-10-CM | POA: Diagnosis not present

## 2017-03-27 DIAGNOSIS — E119 Type 2 diabetes mellitus without complications: Secondary | ICD-10-CM | POA: Diagnosis not present

## 2017-03-27 DIAGNOSIS — G894 Chronic pain syndrome: Secondary | ICD-10-CM | POA: Diagnosis not present

## 2017-03-27 DIAGNOSIS — R079 Chest pain, unspecified: Secondary | ICD-10-CM | POA: Diagnosis not present

## 2017-03-27 DIAGNOSIS — F341 Dysthymic disorder: Secondary | ICD-10-CM | POA: Diagnosis not present

## 2017-03-28 DIAGNOSIS — R5381 Other malaise: Secondary | ICD-10-CM | POA: Diagnosis not present

## 2017-03-28 DIAGNOSIS — R42 Dizziness and giddiness: Secondary | ICD-10-CM | POA: Diagnosis not present

## 2017-03-28 DIAGNOSIS — I10 Essential (primary) hypertension: Secondary | ICD-10-CM | POA: Diagnosis not present

## 2017-03-28 DIAGNOSIS — E7849 Other hyperlipidemia: Secondary | ICD-10-CM | POA: Diagnosis not present

## 2017-03-28 DIAGNOSIS — E119 Type 2 diabetes mellitus without complications: Secondary | ICD-10-CM | POA: Diagnosis not present

## 2017-04-26 DIAGNOSIS — M728 Other fibroblastic disorders: Secondary | ICD-10-CM | POA: Diagnosis not present

## 2017-04-26 DIAGNOSIS — M549 Dorsalgia, unspecified: Secondary | ICD-10-CM | POA: Diagnosis not present

## 2017-04-26 DIAGNOSIS — E119 Type 2 diabetes mellitus without complications: Secondary | ICD-10-CM | POA: Diagnosis not present

## 2017-04-26 DIAGNOSIS — G894 Chronic pain syndrome: Secondary | ICD-10-CM | POA: Diagnosis not present

## 2017-05-03 DIAGNOSIS — E113511 Type 2 diabetes mellitus with proliferative diabetic retinopathy with macular edema, right eye: Secondary | ICD-10-CM | POA: Diagnosis not present

## 2017-05-03 DIAGNOSIS — H35371 Puckering of macula, right eye: Secondary | ICD-10-CM | POA: Diagnosis not present

## 2017-05-03 DIAGNOSIS — H26491 Other secondary cataract, right eye: Secondary | ICD-10-CM | POA: Diagnosis not present

## 2017-05-03 DIAGNOSIS — H4311 Vitreous hemorrhage, right eye: Secondary | ICD-10-CM | POA: Diagnosis not present

## 2017-05-04 DIAGNOSIS — E113511 Type 2 diabetes mellitus with proliferative diabetic retinopathy with macular edema, right eye: Secondary | ICD-10-CM | POA: Diagnosis not present

## 2017-05-04 DIAGNOSIS — H4311 Vitreous hemorrhage, right eye: Secondary | ICD-10-CM | POA: Diagnosis not present

## 2017-05-24 DIAGNOSIS — G894 Chronic pain syndrome: Secondary | ICD-10-CM | POA: Diagnosis not present

## 2017-05-24 DIAGNOSIS — E119 Type 2 diabetes mellitus without complications: Secondary | ICD-10-CM | POA: Diagnosis not present

## 2017-05-24 DIAGNOSIS — M797 Fibromyalgia: Secondary | ICD-10-CM | POA: Diagnosis not present

## 2017-05-24 DIAGNOSIS — R270 Ataxia, unspecified: Secondary | ICD-10-CM | POA: Diagnosis not present

## 2017-05-30 DIAGNOSIS — H4311 Vitreous hemorrhage, right eye: Secondary | ICD-10-CM | POA: Diagnosis not present

## 2017-05-30 DIAGNOSIS — E113513 Type 2 diabetes mellitus with proliferative diabetic retinopathy with macular edema, bilateral: Secondary | ICD-10-CM | POA: Diagnosis not present

## 2017-06-22 DIAGNOSIS — E119 Type 2 diabetes mellitus without complications: Secondary | ICD-10-CM | POA: Diagnosis not present

## 2017-06-22 DIAGNOSIS — M961 Postlaminectomy syndrome, not elsewhere classified: Secondary | ICD-10-CM | POA: Diagnosis not present

## 2017-06-22 DIAGNOSIS — M797 Fibromyalgia: Secondary | ICD-10-CM | POA: Diagnosis not present

## 2017-06-22 DIAGNOSIS — F341 Dysthymic disorder: Secondary | ICD-10-CM | POA: Diagnosis not present

## 2017-06-23 DIAGNOSIS — R29709 NIHSS score 9: Secondary | ICD-10-CM | POA: Diagnosis present

## 2017-06-23 DIAGNOSIS — R002 Palpitations: Secondary | ICD-10-CM | POA: Diagnosis not present

## 2017-06-23 DIAGNOSIS — R197 Diarrhea, unspecified: Secondary | ICD-10-CM | POA: Diagnosis not present

## 2017-06-23 DIAGNOSIS — Z7902 Long term (current) use of antithrombotics/antiplatelets: Secondary | ICD-10-CM | POA: Diagnosis not present

## 2017-06-23 DIAGNOSIS — E1140 Type 2 diabetes mellitus with diabetic neuropathy, unspecified: Secondary | ICD-10-CM | POA: Diagnosis not present

## 2017-06-23 DIAGNOSIS — I6522 Occlusion and stenosis of left carotid artery: Secondary | ICD-10-CM | POA: Diagnosis not present

## 2017-06-23 DIAGNOSIS — E119 Type 2 diabetes mellitus without complications: Secondary | ICD-10-CM | POA: Diagnosis not present

## 2017-06-23 DIAGNOSIS — Z7982 Long term (current) use of aspirin: Secondary | ICD-10-CM | POA: Diagnosis not present

## 2017-06-23 DIAGNOSIS — R4781 Slurred speech: Secondary | ICD-10-CM | POA: Diagnosis present

## 2017-06-23 DIAGNOSIS — R0902 Hypoxemia: Secondary | ICD-10-CM | POA: Diagnosis not present

## 2017-06-23 DIAGNOSIS — R29810 Facial weakness: Secondary | ICD-10-CM | POA: Diagnosis present

## 2017-06-23 DIAGNOSIS — M6281 Muscle weakness (generalized): Secondary | ICD-10-CM | POA: Diagnosis not present

## 2017-06-23 DIAGNOSIS — Z882 Allergy status to sulfonamides status: Secondary | ICD-10-CM | POA: Diagnosis not present

## 2017-06-23 DIAGNOSIS — E785 Hyperlipidemia, unspecified: Secondary | ICD-10-CM | POA: Diagnosis present

## 2017-06-23 DIAGNOSIS — I2510 Atherosclerotic heart disease of native coronary artery without angina pectoris: Secondary | ICD-10-CM | POA: Diagnosis not present

## 2017-06-23 DIAGNOSIS — I110 Hypertensive heart disease with heart failure: Secondary | ICD-10-CM | POA: Diagnosis present

## 2017-06-23 DIAGNOSIS — F17210 Nicotine dependence, cigarettes, uncomplicated: Secondary | ICD-10-CM | POA: Diagnosis present

## 2017-06-23 DIAGNOSIS — I25119 Atherosclerotic heart disease of native coronary artery with unspecified angina pectoris: Secondary | ICD-10-CM | POA: Diagnosis present

## 2017-06-23 DIAGNOSIS — G629 Polyneuropathy, unspecified: Secondary | ICD-10-CM | POA: Diagnosis not present

## 2017-06-23 DIAGNOSIS — M797 Fibromyalgia: Secondary | ICD-10-CM | POA: Diagnosis present

## 2017-06-23 DIAGNOSIS — F419 Anxiety disorder, unspecified: Secondary | ICD-10-CM | POA: Diagnosis present

## 2017-06-23 DIAGNOSIS — E7800 Pure hypercholesterolemia, unspecified: Secondary | ICD-10-CM | POA: Diagnosis present

## 2017-06-23 DIAGNOSIS — Z888 Allergy status to other drugs, medicaments and biological substances status: Secondary | ICD-10-CM | POA: Diagnosis not present

## 2017-06-23 DIAGNOSIS — J340 Abscess, furuncle and carbuncle of nose: Secondary | ICD-10-CM | POA: Diagnosis present

## 2017-06-23 DIAGNOSIS — I6381 Other cerebral infarction due to occlusion or stenosis of small artery: Secondary | ICD-10-CM | POA: Diagnosis not present

## 2017-06-23 DIAGNOSIS — I10 Essential (primary) hypertension: Secondary | ICD-10-CM | POA: Diagnosis not present

## 2017-06-23 DIAGNOSIS — E1165 Type 2 diabetes mellitus with hyperglycemia: Secondary | ICD-10-CM | POA: Diagnosis present

## 2017-06-23 DIAGNOSIS — I5042 Chronic combined systolic (congestive) and diastolic (congestive) heart failure: Secondary | ICD-10-CM | POA: Diagnosis present

## 2017-06-23 DIAGNOSIS — G8191 Hemiplegia, unspecified affecting right dominant side: Secondary | ICD-10-CM | POA: Diagnosis present

## 2017-06-23 DIAGNOSIS — I639 Cerebral infarction, unspecified: Secondary | ICD-10-CM | POA: Diagnosis not present

## 2017-06-23 DIAGNOSIS — I440 Atrioventricular block, first degree: Secondary | ICD-10-CM | POA: Diagnosis present

## 2017-06-26 ENCOUNTER — Other Ambulatory Visit: Payer: Self-pay | Admitting: Internal Medicine

## 2017-06-26 DIAGNOSIS — R29898 Other symptoms and signs involving the musculoskeletal system: Secondary | ICD-10-CM

## 2017-06-28 DIAGNOSIS — M4802 Spinal stenosis, cervical region: Secondary | ICD-10-CM | POA: Diagnosis not present

## 2017-06-28 DIAGNOSIS — Z792 Long term (current) use of antibiotics: Secondary | ICD-10-CM | POA: Diagnosis not present

## 2017-06-28 DIAGNOSIS — Z794 Long term (current) use of insulin: Secondary | ICD-10-CM | POA: Diagnosis not present

## 2017-06-28 DIAGNOSIS — E1140 Type 2 diabetes mellitus with diabetic neuropathy, unspecified: Secondary | ICD-10-CM | POA: Diagnosis not present

## 2017-06-28 DIAGNOSIS — J340 Abscess, furuncle and carbuncle of nose: Secondary | ICD-10-CM | POA: Diagnosis not present

## 2017-06-28 DIAGNOSIS — Z79891 Long term (current) use of opiate analgesic: Secondary | ICD-10-CM | POA: Diagnosis not present

## 2017-06-28 DIAGNOSIS — Z9181 History of falling: Secondary | ICD-10-CM | POA: Diagnosis not present

## 2017-06-28 DIAGNOSIS — I2510 Atherosclerotic heart disease of native coronary artery without angina pectoris: Secondary | ICD-10-CM | POA: Diagnosis not present

## 2017-06-28 DIAGNOSIS — F17210 Nicotine dependence, cigarettes, uncomplicated: Secondary | ICD-10-CM | POA: Diagnosis not present

## 2017-06-28 DIAGNOSIS — M797 Fibromyalgia: Secondary | ICD-10-CM | POA: Diagnosis not present

## 2017-06-28 DIAGNOSIS — I69351 Hemiplegia and hemiparesis following cerebral infarction affecting right dominant side: Secondary | ICD-10-CM | POA: Diagnosis not present

## 2017-06-28 DIAGNOSIS — F329 Major depressive disorder, single episode, unspecified: Secondary | ICD-10-CM | POA: Diagnosis not present

## 2017-06-28 DIAGNOSIS — I110 Hypertensive heart disease with heart failure: Secondary | ICD-10-CM | POA: Diagnosis not present

## 2017-06-28 DIAGNOSIS — F419 Anxiety disorder, unspecified: Secondary | ICD-10-CM | POA: Diagnosis not present

## 2017-06-28 DIAGNOSIS — E785 Hyperlipidemia, unspecified: Secondary | ICD-10-CM | POA: Diagnosis not present

## 2017-06-28 DIAGNOSIS — I509 Heart failure, unspecified: Secondary | ICD-10-CM | POA: Diagnosis not present

## 2017-07-02 ENCOUNTER — Ambulatory Visit: Payer: Medicare Other

## 2017-07-04 DIAGNOSIS — I509 Heart failure, unspecified: Secondary | ICD-10-CM | POA: Diagnosis not present

## 2017-07-04 DIAGNOSIS — I110 Hypertensive heart disease with heart failure: Secondary | ICD-10-CM | POA: Diagnosis not present

## 2017-07-04 DIAGNOSIS — J340 Abscess, furuncle and carbuncle of nose: Secondary | ICD-10-CM | POA: Diagnosis not present

## 2017-07-04 DIAGNOSIS — M797 Fibromyalgia: Secondary | ICD-10-CM | POA: Diagnosis not present

## 2017-07-04 DIAGNOSIS — I69351 Hemiplegia and hemiparesis following cerebral infarction affecting right dominant side: Secondary | ICD-10-CM | POA: Diagnosis not present

## 2017-07-04 DIAGNOSIS — M4802 Spinal stenosis, cervical region: Secondary | ICD-10-CM | POA: Diagnosis not present

## 2017-07-05 DIAGNOSIS — I059 Rheumatic mitral valve disease, unspecified: Secondary | ICD-10-CM | POA: Diagnosis not present

## 2017-07-05 DIAGNOSIS — I6522 Occlusion and stenosis of left carotid artery: Secondary | ICD-10-CM | POA: Diagnosis not present

## 2017-07-05 DIAGNOSIS — I69369 Other paralytic syndrome following cerebral infarction affecting unspecified side: Secondary | ICD-10-CM | POA: Diagnosis not present

## 2017-07-05 DIAGNOSIS — R259 Unspecified abnormal involuntary movements: Secondary | ICD-10-CM | POA: Diagnosis not present

## 2017-07-06 DIAGNOSIS — I69351 Hemiplegia and hemiparesis following cerebral infarction affecting right dominant side: Secondary | ICD-10-CM | POA: Diagnosis not present

## 2017-07-06 DIAGNOSIS — J340 Abscess, furuncle and carbuncle of nose: Secondary | ICD-10-CM | POA: Diagnosis not present

## 2017-07-06 DIAGNOSIS — M4802 Spinal stenosis, cervical region: Secondary | ICD-10-CM | POA: Diagnosis not present

## 2017-07-06 DIAGNOSIS — M797 Fibromyalgia: Secondary | ICD-10-CM | POA: Diagnosis not present

## 2017-07-06 DIAGNOSIS — I110 Hypertensive heart disease with heart failure: Secondary | ICD-10-CM | POA: Diagnosis not present

## 2017-07-06 DIAGNOSIS — I509 Heart failure, unspecified: Secondary | ICD-10-CM | POA: Diagnosis not present

## 2017-07-07 DIAGNOSIS — I509 Heart failure, unspecified: Secondary | ICD-10-CM | POA: Diagnosis not present

## 2017-07-07 DIAGNOSIS — M4802 Spinal stenosis, cervical region: Secondary | ICD-10-CM | POA: Diagnosis not present

## 2017-07-07 DIAGNOSIS — I110 Hypertensive heart disease with heart failure: Secondary | ICD-10-CM | POA: Diagnosis not present

## 2017-07-07 DIAGNOSIS — I69351 Hemiplegia and hemiparesis following cerebral infarction affecting right dominant side: Secondary | ICD-10-CM | POA: Diagnosis not present

## 2017-07-07 DIAGNOSIS — M797 Fibromyalgia: Secondary | ICD-10-CM | POA: Diagnosis not present

## 2017-07-07 DIAGNOSIS — J340 Abscess, furuncle and carbuncle of nose: Secondary | ICD-10-CM | POA: Diagnosis not present

## 2017-07-09 DIAGNOSIS — I69351 Hemiplegia and hemiparesis following cerebral infarction affecting right dominant side: Secondary | ICD-10-CM | POA: Diagnosis not present

## 2017-07-09 DIAGNOSIS — M4802 Spinal stenosis, cervical region: Secondary | ICD-10-CM | POA: Diagnosis not present

## 2017-07-09 DIAGNOSIS — I110 Hypertensive heart disease with heart failure: Secondary | ICD-10-CM | POA: Diagnosis not present

## 2017-07-09 DIAGNOSIS — M797 Fibromyalgia: Secondary | ICD-10-CM | POA: Diagnosis not present

## 2017-07-09 DIAGNOSIS — J340 Abscess, furuncle and carbuncle of nose: Secondary | ICD-10-CM | POA: Diagnosis not present

## 2017-07-09 DIAGNOSIS — I509 Heart failure, unspecified: Secondary | ICD-10-CM | POA: Diagnosis not present

## 2017-07-11 DIAGNOSIS — M4802 Spinal stenosis, cervical region: Secondary | ICD-10-CM | POA: Diagnosis not present

## 2017-07-11 DIAGNOSIS — M797 Fibromyalgia: Secondary | ICD-10-CM | POA: Diagnosis not present

## 2017-07-11 DIAGNOSIS — I509 Heart failure, unspecified: Secondary | ICD-10-CM | POA: Diagnosis not present

## 2017-07-11 DIAGNOSIS — I69351 Hemiplegia and hemiparesis following cerebral infarction affecting right dominant side: Secondary | ICD-10-CM | POA: Diagnosis not present

## 2017-07-11 DIAGNOSIS — J340 Abscess, furuncle and carbuncle of nose: Secondary | ICD-10-CM | POA: Diagnosis not present

## 2017-07-11 DIAGNOSIS — I110 Hypertensive heart disease with heart failure: Secondary | ICD-10-CM | POA: Diagnosis not present

## 2017-07-12 DIAGNOSIS — I69351 Hemiplegia and hemiparesis following cerebral infarction affecting right dominant side: Secondary | ICD-10-CM | POA: Diagnosis not present

## 2017-07-12 DIAGNOSIS — M4802 Spinal stenosis, cervical region: Secondary | ICD-10-CM | POA: Diagnosis not present

## 2017-07-12 DIAGNOSIS — J340 Abscess, furuncle and carbuncle of nose: Secondary | ICD-10-CM | POA: Diagnosis not present

## 2017-07-12 DIAGNOSIS — I110 Hypertensive heart disease with heart failure: Secondary | ICD-10-CM | POA: Diagnosis not present

## 2017-07-12 DIAGNOSIS — I509 Heart failure, unspecified: Secondary | ICD-10-CM | POA: Diagnosis not present

## 2017-07-12 DIAGNOSIS — M797 Fibromyalgia: Secondary | ICD-10-CM | POA: Diagnosis not present

## 2017-07-13 DIAGNOSIS — I69351 Hemiplegia and hemiparesis following cerebral infarction affecting right dominant side: Secondary | ICD-10-CM | POA: Diagnosis not present

## 2017-07-13 DIAGNOSIS — I509 Heart failure, unspecified: Secondary | ICD-10-CM | POA: Diagnosis not present

## 2017-07-13 DIAGNOSIS — I110 Hypertensive heart disease with heart failure: Secondary | ICD-10-CM | POA: Diagnosis not present

## 2017-07-13 DIAGNOSIS — M797 Fibromyalgia: Secondary | ICD-10-CM | POA: Diagnosis not present

## 2017-07-13 DIAGNOSIS — J340 Abscess, furuncle and carbuncle of nose: Secondary | ICD-10-CM | POA: Diagnosis not present

## 2017-07-13 DIAGNOSIS — M4802 Spinal stenosis, cervical region: Secondary | ICD-10-CM | POA: Diagnosis not present

## 2017-07-16 DIAGNOSIS — I69351 Hemiplegia and hemiparesis following cerebral infarction affecting right dominant side: Secondary | ICD-10-CM | POA: Diagnosis not present

## 2017-07-16 DIAGNOSIS — J340 Abscess, furuncle and carbuncle of nose: Secondary | ICD-10-CM | POA: Diagnosis not present

## 2017-07-16 DIAGNOSIS — M797 Fibromyalgia: Secondary | ICD-10-CM | POA: Diagnosis not present

## 2017-07-16 DIAGNOSIS — I110 Hypertensive heart disease with heart failure: Secondary | ICD-10-CM | POA: Diagnosis not present

## 2017-07-16 DIAGNOSIS — M4802 Spinal stenosis, cervical region: Secondary | ICD-10-CM | POA: Diagnosis not present

## 2017-07-16 DIAGNOSIS — I509 Heart failure, unspecified: Secondary | ICD-10-CM | POA: Diagnosis not present

## 2017-07-17 DIAGNOSIS — I110 Hypertensive heart disease with heart failure: Secondary | ICD-10-CM | POA: Diagnosis not present

## 2017-07-17 DIAGNOSIS — I69351 Hemiplegia and hemiparesis following cerebral infarction affecting right dominant side: Secondary | ICD-10-CM | POA: Diagnosis not present

## 2017-07-17 DIAGNOSIS — M797 Fibromyalgia: Secondary | ICD-10-CM | POA: Diagnosis not present

## 2017-07-17 DIAGNOSIS — J340 Abscess, furuncle and carbuncle of nose: Secondary | ICD-10-CM | POA: Diagnosis not present

## 2017-07-17 DIAGNOSIS — I509 Heart failure, unspecified: Secondary | ICD-10-CM | POA: Diagnosis not present

## 2017-07-17 DIAGNOSIS — M4802 Spinal stenosis, cervical region: Secondary | ICD-10-CM | POA: Diagnosis not present

## 2017-07-20 DIAGNOSIS — I69351 Hemiplegia and hemiparesis following cerebral infarction affecting right dominant side: Secondary | ICD-10-CM | POA: Diagnosis not present

## 2017-07-20 DIAGNOSIS — J340 Abscess, furuncle and carbuncle of nose: Secondary | ICD-10-CM | POA: Diagnosis not present

## 2017-07-20 DIAGNOSIS — I509 Heart failure, unspecified: Secondary | ICD-10-CM | POA: Diagnosis not present

## 2017-07-20 DIAGNOSIS — M797 Fibromyalgia: Secondary | ICD-10-CM | POA: Diagnosis not present

## 2017-07-20 DIAGNOSIS — I110 Hypertensive heart disease with heart failure: Secondary | ICD-10-CM | POA: Diagnosis not present

## 2017-07-20 DIAGNOSIS — M4802 Spinal stenosis, cervical region: Secondary | ICD-10-CM | POA: Diagnosis not present

## 2017-07-23 DIAGNOSIS — I69351 Hemiplegia and hemiparesis following cerebral infarction affecting right dominant side: Secondary | ICD-10-CM | POA: Diagnosis not present

## 2017-07-23 DIAGNOSIS — I509 Heart failure, unspecified: Secondary | ICD-10-CM | POA: Diagnosis not present

## 2017-07-23 DIAGNOSIS — J340 Abscess, furuncle and carbuncle of nose: Secondary | ICD-10-CM | POA: Diagnosis not present

## 2017-07-23 DIAGNOSIS — I110 Hypertensive heart disease with heart failure: Secondary | ICD-10-CM | POA: Diagnosis not present

## 2017-07-23 DIAGNOSIS — M4802 Spinal stenosis, cervical region: Secondary | ICD-10-CM | POA: Diagnosis not present

## 2017-07-23 DIAGNOSIS — M797 Fibromyalgia: Secondary | ICD-10-CM | POA: Diagnosis not present

## 2017-07-26 DIAGNOSIS — M797 Fibromyalgia: Secondary | ICD-10-CM | POA: Diagnosis not present

## 2017-07-26 DIAGNOSIS — R259 Unspecified abnormal involuntary movements: Secondary | ICD-10-CM | POA: Diagnosis not present

## 2017-07-26 DIAGNOSIS — I059 Rheumatic mitral valve disease, unspecified: Secondary | ICD-10-CM | POA: Diagnosis not present

## 2017-07-26 DIAGNOSIS — I69369 Other paralytic syndrome following cerebral infarction affecting unspecified side: Secondary | ICD-10-CM | POA: Diagnosis not present

## 2017-07-28 DIAGNOSIS — M797 Fibromyalgia: Secondary | ICD-10-CM | POA: Diagnosis not present

## 2017-07-28 DIAGNOSIS — I110 Hypertensive heart disease with heart failure: Secondary | ICD-10-CM | POA: Diagnosis not present

## 2017-07-28 DIAGNOSIS — I69351 Hemiplegia and hemiparesis following cerebral infarction affecting right dominant side: Secondary | ICD-10-CM | POA: Diagnosis not present

## 2017-07-28 DIAGNOSIS — I509 Heart failure, unspecified: Secondary | ICD-10-CM | POA: Diagnosis not present

## 2017-07-28 DIAGNOSIS — J340 Abscess, furuncle and carbuncle of nose: Secondary | ICD-10-CM | POA: Diagnosis not present

## 2017-07-28 DIAGNOSIS — M4802 Spinal stenosis, cervical region: Secondary | ICD-10-CM | POA: Diagnosis not present

## 2017-08-01 DIAGNOSIS — H2512 Age-related nuclear cataract, left eye: Secondary | ICD-10-CM | POA: Diagnosis not present

## 2017-08-01 DIAGNOSIS — H25012 Cortical age-related cataract, left eye: Secondary | ICD-10-CM | POA: Diagnosis not present

## 2017-08-01 DIAGNOSIS — Z8673 Personal history of transient ischemic attack (TIA), and cerebral infarction without residual deficits: Secondary | ICD-10-CM | POA: Diagnosis not present

## 2017-08-01 DIAGNOSIS — H34231 Retinal artery branch occlusion, right eye: Secondary | ICD-10-CM | POA: Diagnosis not present

## 2017-08-01 DIAGNOSIS — E113513 Type 2 diabetes mellitus with proliferative diabetic retinopathy with macular edema, bilateral: Secondary | ICD-10-CM | POA: Diagnosis not present

## 2017-08-01 DIAGNOSIS — H3563 Retinal hemorrhage, bilateral: Secondary | ICD-10-CM | POA: Diagnosis not present

## 2017-08-02 DIAGNOSIS — I69351 Hemiplegia and hemiparesis following cerebral infarction affecting right dominant side: Secondary | ICD-10-CM | POA: Diagnosis not present

## 2017-08-02 DIAGNOSIS — I509 Heart failure, unspecified: Secondary | ICD-10-CM | POA: Diagnosis not present

## 2017-08-02 DIAGNOSIS — M4802 Spinal stenosis, cervical region: Secondary | ICD-10-CM | POA: Diagnosis not present

## 2017-08-02 DIAGNOSIS — J340 Abscess, furuncle and carbuncle of nose: Secondary | ICD-10-CM | POA: Diagnosis not present

## 2017-08-02 DIAGNOSIS — M797 Fibromyalgia: Secondary | ICD-10-CM | POA: Diagnosis not present

## 2017-08-02 DIAGNOSIS — I63332 Cerebral infarction due to thrombosis of left posterior cerebral artery: Secondary | ICD-10-CM | POA: Diagnosis not present

## 2017-08-02 DIAGNOSIS — I110 Hypertensive heart disease with heart failure: Secondary | ICD-10-CM | POA: Diagnosis not present

## 2017-08-04 DIAGNOSIS — J340 Abscess, furuncle and carbuncle of nose: Secondary | ICD-10-CM | POA: Diagnosis not present

## 2017-08-04 DIAGNOSIS — I69351 Hemiplegia and hemiparesis following cerebral infarction affecting right dominant side: Secondary | ICD-10-CM | POA: Diagnosis not present

## 2017-08-04 DIAGNOSIS — I110 Hypertensive heart disease with heart failure: Secondary | ICD-10-CM | POA: Diagnosis not present

## 2017-08-04 DIAGNOSIS — I509 Heart failure, unspecified: Secondary | ICD-10-CM | POA: Diagnosis not present

## 2017-08-04 DIAGNOSIS — M4802 Spinal stenosis, cervical region: Secondary | ICD-10-CM | POA: Diagnosis not present

## 2017-08-04 DIAGNOSIS — M797 Fibromyalgia: Secondary | ICD-10-CM | POA: Diagnosis not present

## 2017-08-07 DIAGNOSIS — M4802 Spinal stenosis, cervical region: Secondary | ICD-10-CM | POA: Diagnosis not present

## 2017-08-07 DIAGNOSIS — I509 Heart failure, unspecified: Secondary | ICD-10-CM | POA: Diagnosis not present

## 2017-08-07 DIAGNOSIS — H25812 Combined forms of age-related cataract, left eye: Secondary | ICD-10-CM | POA: Diagnosis not present

## 2017-08-07 DIAGNOSIS — J340 Abscess, furuncle and carbuncle of nose: Secondary | ICD-10-CM | POA: Diagnosis not present

## 2017-08-07 DIAGNOSIS — I110 Hypertensive heart disease with heart failure: Secondary | ICD-10-CM | POA: Diagnosis not present

## 2017-08-07 DIAGNOSIS — I69351 Hemiplegia and hemiparesis following cerebral infarction affecting right dominant side: Secondary | ICD-10-CM | POA: Diagnosis not present

## 2017-08-07 DIAGNOSIS — H2512 Age-related nuclear cataract, left eye: Secondary | ICD-10-CM | POA: Diagnosis not present

## 2017-08-07 DIAGNOSIS — M797 Fibromyalgia: Secondary | ICD-10-CM | POA: Diagnosis not present

## 2017-08-09 DIAGNOSIS — M797 Fibromyalgia: Secondary | ICD-10-CM | POA: Diagnosis not present

## 2017-08-09 DIAGNOSIS — J340 Abscess, furuncle and carbuncle of nose: Secondary | ICD-10-CM | POA: Diagnosis not present

## 2017-08-09 DIAGNOSIS — I509 Heart failure, unspecified: Secondary | ICD-10-CM | POA: Diagnosis not present

## 2017-08-09 DIAGNOSIS — I110 Hypertensive heart disease with heart failure: Secondary | ICD-10-CM | POA: Diagnosis not present

## 2017-08-09 DIAGNOSIS — M4802 Spinal stenosis, cervical region: Secondary | ICD-10-CM | POA: Diagnosis not present

## 2017-08-09 DIAGNOSIS — I69351 Hemiplegia and hemiparesis following cerebral infarction affecting right dominant side: Secondary | ICD-10-CM | POA: Diagnosis not present

## 2017-08-14 DIAGNOSIS — M797 Fibromyalgia: Secondary | ICD-10-CM | POA: Diagnosis not present

## 2017-08-14 DIAGNOSIS — I509 Heart failure, unspecified: Secondary | ICD-10-CM | POA: Diagnosis not present

## 2017-08-14 DIAGNOSIS — J340 Abscess, furuncle and carbuncle of nose: Secondary | ICD-10-CM | POA: Diagnosis not present

## 2017-08-14 DIAGNOSIS — M4802 Spinal stenosis, cervical region: Secondary | ICD-10-CM | POA: Diagnosis not present

## 2017-08-14 DIAGNOSIS — I69351 Hemiplegia and hemiparesis following cerebral infarction affecting right dominant side: Secondary | ICD-10-CM | POA: Diagnosis not present

## 2017-08-14 DIAGNOSIS — I110 Hypertensive heart disease with heart failure: Secondary | ICD-10-CM | POA: Diagnosis not present

## 2017-08-15 DIAGNOSIS — H2512 Age-related nuclear cataract, left eye: Secondary | ICD-10-CM | POA: Diagnosis not present

## 2017-08-23 DIAGNOSIS — R259 Unspecified abnormal involuntary movements: Secondary | ICD-10-CM | POA: Diagnosis not present

## 2017-08-23 DIAGNOSIS — M797 Fibromyalgia: Secondary | ICD-10-CM | POA: Diagnosis not present

## 2017-08-23 DIAGNOSIS — I059 Rheumatic mitral valve disease, unspecified: Secondary | ICD-10-CM | POA: Diagnosis not present

## 2017-08-23 DIAGNOSIS — I69369 Other paralytic syndrome following cerebral infarction affecting unspecified side: Secondary | ICD-10-CM | POA: Diagnosis not present

## 2017-08-29 DIAGNOSIS — E113413 Type 2 diabetes mellitus with severe nonproliferative diabetic retinopathy with macular edema, bilateral: Secondary | ICD-10-CM | POA: Diagnosis not present

## 2017-08-29 DIAGNOSIS — F341 Dysthymic disorder: Secondary | ICD-10-CM | POA: Diagnosis not present

## 2017-08-29 DIAGNOSIS — M961 Postlaminectomy syndrome, not elsewhere classified: Secondary | ICD-10-CM | POA: Diagnosis not present

## 2017-08-29 DIAGNOSIS — G894 Chronic pain syndrome: Secondary | ICD-10-CM | POA: Diagnosis not present

## 2017-08-29 DIAGNOSIS — R260 Ataxic gait: Secondary | ICD-10-CM | POA: Diagnosis not present

## 2017-08-29 DIAGNOSIS — M6281 Muscle weakness (generalized): Secondary | ICD-10-CM | POA: Diagnosis not present

## 2017-09-04 DIAGNOSIS — F341 Dysthymic disorder: Secondary | ICD-10-CM | POA: Diagnosis not present

## 2017-09-04 DIAGNOSIS — E113413 Type 2 diabetes mellitus with severe nonproliferative diabetic retinopathy with macular edema, bilateral: Secondary | ICD-10-CM | POA: Diagnosis not present

## 2017-09-04 DIAGNOSIS — G894 Chronic pain syndrome: Secondary | ICD-10-CM | POA: Diagnosis not present

## 2017-09-04 DIAGNOSIS — M961 Postlaminectomy syndrome, not elsewhere classified: Secondary | ICD-10-CM | POA: Diagnosis not present

## 2017-09-04 DIAGNOSIS — R260 Ataxic gait: Secondary | ICD-10-CM | POA: Diagnosis not present

## 2017-09-04 DIAGNOSIS — M6281 Muscle weakness (generalized): Secondary | ICD-10-CM | POA: Diagnosis not present

## 2017-09-05 DIAGNOSIS — M961 Postlaminectomy syndrome, not elsewhere classified: Secondary | ICD-10-CM | POA: Diagnosis not present

## 2017-09-05 DIAGNOSIS — G894 Chronic pain syndrome: Secondary | ICD-10-CM | POA: Diagnosis not present

## 2017-09-05 DIAGNOSIS — E113413 Type 2 diabetes mellitus with severe nonproliferative diabetic retinopathy with macular edema, bilateral: Secondary | ICD-10-CM | POA: Diagnosis not present

## 2017-09-05 DIAGNOSIS — M6281 Muscle weakness (generalized): Secondary | ICD-10-CM | POA: Diagnosis not present

## 2017-09-05 DIAGNOSIS — F341 Dysthymic disorder: Secondary | ICD-10-CM | POA: Diagnosis not present

## 2017-09-05 DIAGNOSIS — R260 Ataxic gait: Secondary | ICD-10-CM | POA: Diagnosis not present

## 2017-09-06 DIAGNOSIS — M6281 Muscle weakness (generalized): Secondary | ICD-10-CM | POA: Diagnosis not present

## 2017-09-06 DIAGNOSIS — G894 Chronic pain syndrome: Secondary | ICD-10-CM | POA: Diagnosis not present

## 2017-09-06 DIAGNOSIS — E113413 Type 2 diabetes mellitus with severe nonproliferative diabetic retinopathy with macular edema, bilateral: Secondary | ICD-10-CM | POA: Diagnosis not present

## 2017-09-06 DIAGNOSIS — F341 Dysthymic disorder: Secondary | ICD-10-CM | POA: Diagnosis not present

## 2017-09-06 DIAGNOSIS — R260 Ataxic gait: Secondary | ICD-10-CM | POA: Diagnosis not present

## 2017-09-06 DIAGNOSIS — M961 Postlaminectomy syndrome, not elsewhere classified: Secondary | ICD-10-CM | POA: Diagnosis not present

## 2017-09-10 DIAGNOSIS — M6281 Muscle weakness (generalized): Secondary | ICD-10-CM | POA: Diagnosis not present

## 2017-09-10 DIAGNOSIS — G894 Chronic pain syndrome: Secondary | ICD-10-CM | POA: Diagnosis not present

## 2017-09-10 DIAGNOSIS — E113413 Type 2 diabetes mellitus with severe nonproliferative diabetic retinopathy with macular edema, bilateral: Secondary | ICD-10-CM | POA: Diagnosis not present

## 2017-09-10 DIAGNOSIS — F341 Dysthymic disorder: Secondary | ICD-10-CM | POA: Diagnosis not present

## 2017-09-10 DIAGNOSIS — M961 Postlaminectomy syndrome, not elsewhere classified: Secondary | ICD-10-CM | POA: Diagnosis not present

## 2017-09-10 DIAGNOSIS — R260 Ataxic gait: Secondary | ICD-10-CM | POA: Diagnosis not present

## 2017-09-12 DIAGNOSIS — R260 Ataxic gait: Secondary | ICD-10-CM | POA: Diagnosis not present

## 2017-09-12 DIAGNOSIS — M961 Postlaminectomy syndrome, not elsewhere classified: Secondary | ICD-10-CM | POA: Diagnosis not present

## 2017-09-12 DIAGNOSIS — E113413 Type 2 diabetes mellitus with severe nonproliferative diabetic retinopathy with macular edema, bilateral: Secondary | ICD-10-CM | POA: Diagnosis not present

## 2017-09-12 DIAGNOSIS — F341 Dysthymic disorder: Secondary | ICD-10-CM | POA: Diagnosis not present

## 2017-09-12 DIAGNOSIS — G894 Chronic pain syndrome: Secondary | ICD-10-CM | POA: Diagnosis not present

## 2017-09-12 DIAGNOSIS — M6281 Muscle weakness (generalized): Secondary | ICD-10-CM | POA: Diagnosis not present

## 2017-09-13 DIAGNOSIS — M6281 Muscle weakness (generalized): Secondary | ICD-10-CM | POA: Diagnosis not present

## 2017-09-13 DIAGNOSIS — E113413 Type 2 diabetes mellitus with severe nonproliferative diabetic retinopathy with macular edema, bilateral: Secondary | ICD-10-CM | POA: Diagnosis not present

## 2017-09-13 DIAGNOSIS — M961 Postlaminectomy syndrome, not elsewhere classified: Secondary | ICD-10-CM | POA: Diagnosis not present

## 2017-09-13 DIAGNOSIS — R260 Ataxic gait: Secondary | ICD-10-CM | POA: Diagnosis not present

## 2017-09-13 DIAGNOSIS — F341 Dysthymic disorder: Secondary | ICD-10-CM | POA: Diagnosis not present

## 2017-09-13 DIAGNOSIS — G894 Chronic pain syndrome: Secondary | ICD-10-CM | POA: Diagnosis not present

## 2017-09-14 DIAGNOSIS — M961 Postlaminectomy syndrome, not elsewhere classified: Secondary | ICD-10-CM | POA: Diagnosis not present

## 2017-09-14 DIAGNOSIS — F341 Dysthymic disorder: Secondary | ICD-10-CM | POA: Diagnosis not present

## 2017-09-14 DIAGNOSIS — R260 Ataxic gait: Secondary | ICD-10-CM | POA: Diagnosis not present

## 2017-09-14 DIAGNOSIS — G894 Chronic pain syndrome: Secondary | ICD-10-CM | POA: Diagnosis not present

## 2017-09-14 DIAGNOSIS — M6281 Muscle weakness (generalized): Secondary | ICD-10-CM | POA: Diagnosis not present

## 2017-09-14 DIAGNOSIS — E113413 Type 2 diabetes mellitus with severe nonproliferative diabetic retinopathy with macular edema, bilateral: Secondary | ICD-10-CM | POA: Diagnosis not present

## 2017-09-19 DIAGNOSIS — M6281 Muscle weakness (generalized): Secondary | ICD-10-CM | POA: Diagnosis not present

## 2017-09-19 DIAGNOSIS — F341 Dysthymic disorder: Secondary | ICD-10-CM | POA: Diagnosis not present

## 2017-09-19 DIAGNOSIS — M961 Postlaminectomy syndrome, not elsewhere classified: Secondary | ICD-10-CM | POA: Diagnosis not present

## 2017-09-19 DIAGNOSIS — G894 Chronic pain syndrome: Secondary | ICD-10-CM | POA: Diagnosis not present

## 2017-09-19 DIAGNOSIS — R260 Ataxic gait: Secondary | ICD-10-CM | POA: Diagnosis not present

## 2017-09-19 DIAGNOSIS — E113413 Type 2 diabetes mellitus with severe nonproliferative diabetic retinopathy with macular edema, bilateral: Secondary | ICD-10-CM | POA: Diagnosis not present

## 2017-09-20 DIAGNOSIS — R260 Ataxic gait: Secondary | ICD-10-CM | POA: Diagnosis not present

## 2017-09-20 DIAGNOSIS — F341 Dysthymic disorder: Secondary | ICD-10-CM | POA: Diagnosis not present

## 2017-09-20 DIAGNOSIS — E113413 Type 2 diabetes mellitus with severe nonproliferative diabetic retinopathy with macular edema, bilateral: Secondary | ICD-10-CM | POA: Diagnosis not present

## 2017-09-20 DIAGNOSIS — M961 Postlaminectomy syndrome, not elsewhere classified: Secondary | ICD-10-CM | POA: Diagnosis not present

## 2017-09-20 DIAGNOSIS — M6281 Muscle weakness (generalized): Secondary | ICD-10-CM | POA: Diagnosis not present

## 2017-09-20 DIAGNOSIS — G894 Chronic pain syndrome: Secondary | ICD-10-CM | POA: Diagnosis not present

## 2017-09-21 DIAGNOSIS — M6281 Muscle weakness (generalized): Secondary | ICD-10-CM | POA: Diagnosis not present

## 2017-09-21 DIAGNOSIS — I059 Rheumatic mitral valve disease, unspecified: Secondary | ICD-10-CM | POA: Diagnosis not present

## 2017-09-21 DIAGNOSIS — I69369 Other paralytic syndrome following cerebral infarction affecting unspecified side: Secondary | ICD-10-CM | POA: Diagnosis not present

## 2017-09-21 DIAGNOSIS — M797 Fibromyalgia: Secondary | ICD-10-CM | POA: Diagnosis not present

## 2017-09-21 DIAGNOSIS — F341 Dysthymic disorder: Secondary | ICD-10-CM | POA: Diagnosis not present

## 2017-09-21 DIAGNOSIS — R260 Ataxic gait: Secondary | ICD-10-CM | POA: Diagnosis not present

## 2017-09-21 DIAGNOSIS — E113413 Type 2 diabetes mellitus with severe nonproliferative diabetic retinopathy with macular edema, bilateral: Secondary | ICD-10-CM | POA: Diagnosis not present

## 2017-09-21 DIAGNOSIS — G894 Chronic pain syndrome: Secondary | ICD-10-CM | POA: Diagnosis not present

## 2017-09-21 DIAGNOSIS — M961 Postlaminectomy syndrome, not elsewhere classified: Secondary | ICD-10-CM | POA: Diagnosis not present

## 2017-09-21 DIAGNOSIS — R259 Unspecified abnormal involuntary movements: Secondary | ICD-10-CM | POA: Diagnosis not present

## 2017-09-24 DIAGNOSIS — M6281 Muscle weakness (generalized): Secondary | ICD-10-CM | POA: Diagnosis not present

## 2017-09-24 DIAGNOSIS — E113413 Type 2 diabetes mellitus with severe nonproliferative diabetic retinopathy with macular edema, bilateral: Secondary | ICD-10-CM | POA: Diagnosis not present

## 2017-09-24 DIAGNOSIS — R260 Ataxic gait: Secondary | ICD-10-CM | POA: Diagnosis not present

## 2017-09-24 DIAGNOSIS — G894 Chronic pain syndrome: Secondary | ICD-10-CM | POA: Diagnosis not present

## 2017-09-24 DIAGNOSIS — F341 Dysthymic disorder: Secondary | ICD-10-CM | POA: Diagnosis not present

## 2017-09-24 DIAGNOSIS — M961 Postlaminectomy syndrome, not elsewhere classified: Secondary | ICD-10-CM | POA: Diagnosis not present

## 2017-09-28 DIAGNOSIS — F341 Dysthymic disorder: Secondary | ICD-10-CM | POA: Diagnosis not present

## 2017-09-28 DIAGNOSIS — M961 Postlaminectomy syndrome, not elsewhere classified: Secondary | ICD-10-CM | POA: Diagnosis not present

## 2017-09-28 DIAGNOSIS — M6281 Muscle weakness (generalized): Secondary | ICD-10-CM | POA: Diagnosis not present

## 2017-09-28 DIAGNOSIS — E113413 Type 2 diabetes mellitus with severe nonproliferative diabetic retinopathy with macular edema, bilateral: Secondary | ICD-10-CM | POA: Diagnosis not present

## 2017-09-28 DIAGNOSIS — R260 Ataxic gait: Secondary | ICD-10-CM | POA: Diagnosis not present

## 2017-09-28 DIAGNOSIS — G894 Chronic pain syndrome: Secondary | ICD-10-CM | POA: Diagnosis not present

## 2017-10-03 DIAGNOSIS — F341 Dysthymic disorder: Secondary | ICD-10-CM | POA: Diagnosis not present

## 2017-10-03 DIAGNOSIS — E113413 Type 2 diabetes mellitus with severe nonproliferative diabetic retinopathy with macular edema, bilateral: Secondary | ICD-10-CM | POA: Diagnosis not present

## 2017-10-03 DIAGNOSIS — M6281 Muscle weakness (generalized): Secondary | ICD-10-CM | POA: Diagnosis not present

## 2017-10-03 DIAGNOSIS — G894 Chronic pain syndrome: Secondary | ICD-10-CM | POA: Diagnosis not present

## 2017-10-03 DIAGNOSIS — R260 Ataxic gait: Secondary | ICD-10-CM | POA: Diagnosis not present

## 2017-10-03 DIAGNOSIS — M961 Postlaminectomy syndrome, not elsewhere classified: Secondary | ICD-10-CM | POA: Diagnosis not present

## 2017-10-04 DIAGNOSIS — R260 Ataxic gait: Secondary | ICD-10-CM | POA: Diagnosis not present

## 2017-10-04 DIAGNOSIS — F341 Dysthymic disorder: Secondary | ICD-10-CM | POA: Diagnosis not present

## 2017-10-04 DIAGNOSIS — M6281 Muscle weakness (generalized): Secondary | ICD-10-CM | POA: Diagnosis not present

## 2017-10-04 DIAGNOSIS — G894 Chronic pain syndrome: Secondary | ICD-10-CM | POA: Diagnosis not present

## 2017-10-04 DIAGNOSIS — M961 Postlaminectomy syndrome, not elsewhere classified: Secondary | ICD-10-CM | POA: Diagnosis not present

## 2017-10-04 DIAGNOSIS — E113413 Type 2 diabetes mellitus with severe nonproliferative diabetic retinopathy with macular edema, bilateral: Secondary | ICD-10-CM | POA: Diagnosis not present

## 2017-10-05 DIAGNOSIS — E113413 Type 2 diabetes mellitus with severe nonproliferative diabetic retinopathy with macular edema, bilateral: Secondary | ICD-10-CM | POA: Diagnosis not present

## 2017-10-05 DIAGNOSIS — F341 Dysthymic disorder: Secondary | ICD-10-CM | POA: Diagnosis not present

## 2017-10-05 DIAGNOSIS — M6281 Muscle weakness (generalized): Secondary | ICD-10-CM | POA: Diagnosis not present

## 2017-10-05 DIAGNOSIS — M961 Postlaminectomy syndrome, not elsewhere classified: Secondary | ICD-10-CM | POA: Diagnosis not present

## 2017-10-05 DIAGNOSIS — G894 Chronic pain syndrome: Secondary | ICD-10-CM | POA: Diagnosis not present

## 2017-10-05 DIAGNOSIS — R260 Ataxic gait: Secondary | ICD-10-CM | POA: Diagnosis not present

## 2017-10-09 DIAGNOSIS — R260 Ataxic gait: Secondary | ICD-10-CM | POA: Diagnosis not present

## 2017-10-09 DIAGNOSIS — E113413 Type 2 diabetes mellitus with severe nonproliferative diabetic retinopathy with macular edema, bilateral: Secondary | ICD-10-CM | POA: Diagnosis not present

## 2017-10-09 DIAGNOSIS — M961 Postlaminectomy syndrome, not elsewhere classified: Secondary | ICD-10-CM | POA: Diagnosis not present

## 2017-10-09 DIAGNOSIS — M6281 Muscle weakness (generalized): Secondary | ICD-10-CM | POA: Diagnosis not present

## 2017-10-09 DIAGNOSIS — F341 Dysthymic disorder: Secondary | ICD-10-CM | POA: Diagnosis not present

## 2017-10-09 DIAGNOSIS — G894 Chronic pain syndrome: Secondary | ICD-10-CM | POA: Diagnosis not present

## 2017-10-11 DIAGNOSIS — G894 Chronic pain syndrome: Secondary | ICD-10-CM | POA: Diagnosis not present

## 2017-10-11 DIAGNOSIS — R260 Ataxic gait: Secondary | ICD-10-CM | POA: Diagnosis not present

## 2017-10-11 DIAGNOSIS — M6281 Muscle weakness (generalized): Secondary | ICD-10-CM | POA: Diagnosis not present

## 2017-10-11 DIAGNOSIS — E113413 Type 2 diabetes mellitus with severe nonproliferative diabetic retinopathy with macular edema, bilateral: Secondary | ICD-10-CM | POA: Diagnosis not present

## 2017-10-11 DIAGNOSIS — M961 Postlaminectomy syndrome, not elsewhere classified: Secondary | ICD-10-CM | POA: Diagnosis not present

## 2017-10-11 DIAGNOSIS — F341 Dysthymic disorder: Secondary | ICD-10-CM | POA: Diagnosis not present

## 2017-10-31 DIAGNOSIS — R259 Unspecified abnormal involuntary movements: Secondary | ICD-10-CM | POA: Diagnosis not present

## 2017-10-31 DIAGNOSIS — I059 Rheumatic mitral valve disease, unspecified: Secondary | ICD-10-CM | POA: Diagnosis not present

## 2017-10-31 DIAGNOSIS — M797 Fibromyalgia: Secondary | ICD-10-CM | POA: Diagnosis not present

## 2017-10-31 DIAGNOSIS — I69369 Other paralytic syndrome following cerebral infarction affecting unspecified side: Secondary | ICD-10-CM | POA: Diagnosis not present

## 2017-11-26 DIAGNOSIS — M961 Postlaminectomy syndrome, not elsewhere classified: Secondary | ICD-10-CM | POA: Diagnosis not present

## 2017-11-26 DIAGNOSIS — R309 Painful micturition, unspecified: Secondary | ICD-10-CM | POA: Diagnosis not present

## 2017-11-26 DIAGNOSIS — M549 Dorsalgia, unspecified: Secondary | ICD-10-CM | POA: Diagnosis not present

## 2017-11-26 DIAGNOSIS — Z0000 Encounter for general adult medical examination without abnormal findings: Secondary | ICD-10-CM | POA: Diagnosis not present

## 2017-11-26 DIAGNOSIS — G8929 Other chronic pain: Secondary | ICD-10-CM | POA: Diagnosis not present

## 2017-12-04 ENCOUNTER — Other Ambulatory Visit: Payer: Self-pay | Admitting: Student

## 2017-12-04 DIAGNOSIS — R1310 Dysphagia, unspecified: Secondary | ICD-10-CM | POA: Diagnosis not present

## 2017-12-04 DIAGNOSIS — Z86010 Personal history of colonic polyps: Secondary | ICD-10-CM | POA: Diagnosis not present

## 2017-12-04 DIAGNOSIS — R4702 Dysphasia: Secondary | ICD-10-CM

## 2017-12-04 DIAGNOSIS — M6289 Other specified disorders of muscle: Secondary | ICD-10-CM | POA: Diagnosis not present

## 2017-12-11 ENCOUNTER — Ambulatory Visit: Payer: Medicare Other

## 2017-12-31 DIAGNOSIS — M961 Postlaminectomy syndrome, not elsewhere classified: Secondary | ICD-10-CM | POA: Diagnosis not present

## 2017-12-31 DIAGNOSIS — I10 Essential (primary) hypertension: Secondary | ICD-10-CM | POA: Diagnosis not present

## 2017-12-31 DIAGNOSIS — R5381 Other malaise: Secondary | ICD-10-CM | POA: Diagnosis not present

## 2017-12-31 DIAGNOSIS — E7849 Other hyperlipidemia: Secondary | ICD-10-CM | POA: Diagnosis not present

## 2017-12-31 DIAGNOSIS — R42 Dizziness and giddiness: Secondary | ICD-10-CM | POA: Diagnosis not present

## 2017-12-31 DIAGNOSIS — R309 Painful micturition, unspecified: Secondary | ICD-10-CM | POA: Diagnosis not present

## 2017-12-31 DIAGNOSIS — G8929 Other chronic pain: Secondary | ICD-10-CM | POA: Diagnosis not present

## 2017-12-31 DIAGNOSIS — E119 Type 2 diabetes mellitus without complications: Secondary | ICD-10-CM | POA: Diagnosis not present

## 2017-12-31 DIAGNOSIS — M549 Dorsalgia, unspecified: Secondary | ICD-10-CM | POA: Diagnosis not present

## 2018-01-02 DIAGNOSIS — I10 Essential (primary) hypertension: Secondary | ICD-10-CM | POA: Diagnosis not present

## 2018-01-02 DIAGNOSIS — T83190A Other mechanical complication of urinary electronic stimulator device, initial encounter: Secondary | ICD-10-CM | POA: Diagnosis not present

## 2018-01-02 DIAGNOSIS — N390 Urinary tract infection, site not specified: Secondary | ICD-10-CM | POA: Diagnosis not present

## 2018-01-02 DIAGNOSIS — T82111D Breakdown (mechanical) of cardiac pulse generator (battery), subsequent encounter: Secondary | ICD-10-CM | POA: Diagnosis not present

## 2018-01-02 DIAGNOSIS — Z7982 Long term (current) use of aspirin: Secondary | ICD-10-CM | POA: Diagnosis not present

## 2018-01-02 DIAGNOSIS — E119 Type 2 diabetes mellitus without complications: Secondary | ICD-10-CM | POA: Diagnosis not present

## 2018-01-02 DIAGNOSIS — F17210 Nicotine dependence, cigarettes, uncomplicated: Secondary | ICD-10-CM | POA: Diagnosis not present

## 2018-01-02 DIAGNOSIS — Z7984 Long term (current) use of oral hypoglycemic drugs: Secondary | ICD-10-CM | POA: Diagnosis not present

## 2018-01-02 DIAGNOSIS — R479 Unspecified speech disturbances: Secondary | ICD-10-CM | POA: Diagnosis not present

## 2018-01-02 DIAGNOSIS — R339 Retention of urine, unspecified: Secondary | ICD-10-CM | POA: Diagnosis not present

## 2018-01-02 DIAGNOSIS — R1310 Dysphagia, unspecified: Secondary | ICD-10-CM | POA: Diagnosis not present

## 2018-01-02 DIAGNOSIS — R399 Unspecified symptoms and signs involving the genitourinary system: Secondary | ICD-10-CM | POA: Diagnosis not present

## 2018-01-02 DIAGNOSIS — Z79899 Other long term (current) drug therapy: Secondary | ICD-10-CM | POA: Diagnosis not present

## 2018-01-03 ENCOUNTER — Ambulatory Visit
Admission: RE | Admit: 2018-01-03 | Discharge: 2018-01-03 | Disposition: A | Discharge status: Home / Self Care | Payer: Medicare Other | Source: Ambulatory Visit | Attending: Student | Admitting: Student

## 2018-01-03 DIAGNOSIS — K219 Gastro-esophageal reflux disease without esophagitis: Secondary | ICD-10-CM | POA: Diagnosis not present

## 2018-01-03 DIAGNOSIS — R1310 Dysphagia, unspecified: Secondary | ICD-10-CM | POA: Diagnosis not present

## 2018-01-03 DIAGNOSIS — R933 Abnormal findings on diagnostic imaging of other parts of digestive tract: Secondary | ICD-10-CM | POA: Insufficient documentation

## 2018-01-03 DIAGNOSIS — R4702 Dysphasia: Secondary | ICD-10-CM

## 2018-01-09 ENCOUNTER — Ambulatory Visit: Payer: Medicare Other | Admitting: Physical Therapy

## 2018-01-09 DIAGNOSIS — Z969 Presence of functional implant, unspecified: Secondary | ICD-10-CM | POA: Diagnosis not present

## 2018-01-09 DIAGNOSIS — T82111D Breakdown (mechanical) of cardiac pulse generator (battery), subsequent encounter: Secondary | ICD-10-CM | POA: Diagnosis not present

## 2018-01-10 DIAGNOSIS — R399 Unspecified symptoms and signs involving the genitourinary system: Secondary | ICD-10-CM | POA: Diagnosis not present

## 2018-01-17 ENCOUNTER — Encounter: Payer: Medicare Other | Admitting: Physical Therapy

## 2018-01-22 ENCOUNTER — Encounter: Payer: Medicare Other | Admitting: Physical Therapy

## 2018-01-29 ENCOUNTER — Other Ambulatory Visit: Payer: Self-pay

## 2018-01-29 ENCOUNTER — Inpatient Hospital Stay
Admit: 2018-01-29 | Discharge: 2018-01-29 | Disposition: A | Discharge status: Home / Self Care | Payer: Medicare Other | Attending: Cardiovascular Disease | Admitting: Cardiovascular Disease

## 2018-01-29 ENCOUNTER — Inpatient Hospital Stay
Admission: EM | Admit: 2018-01-29 | Discharge: 2018-02-01 | DRG: 246 | Disposition: A | Discharge status: Home / Self Care | Payer: Medicare Other | Attending: Internal Medicine | Admitting: Internal Medicine

## 2018-01-29 ENCOUNTER — Emergency Department: Payer: Medicare Other

## 2018-01-29 ENCOUNTER — Encounter: Payer: Self-pay | Admitting: Emergency Medicine

## 2018-01-29 ENCOUNTER — Encounter: Payer: Medicare Other | Admitting: Physical Therapy

## 2018-01-29 DIAGNOSIS — I5023 Acute on chronic systolic (congestive) heart failure: Secondary | ICD-10-CM | POA: Diagnosis present

## 2018-01-29 DIAGNOSIS — R140 Abdominal distension (gaseous): Secondary | ICD-10-CM | POA: Diagnosis not present

## 2018-01-29 DIAGNOSIS — F17210 Nicotine dependence, cigarettes, uncomplicated: Secondary | ICD-10-CM | POA: Diagnosis present

## 2018-01-29 DIAGNOSIS — Z794 Long term (current) use of insulin: Secondary | ICD-10-CM | POA: Diagnosis not present

## 2018-01-29 DIAGNOSIS — E871 Hypo-osmolality and hyponatremia: Secondary | ICD-10-CM | POA: Diagnosis present

## 2018-01-29 DIAGNOSIS — I5021 Acute systolic (congestive) heart failure: Secondary | ICD-10-CM

## 2018-01-29 DIAGNOSIS — Z882 Allergy status to sulfonamides status: Secondary | ICD-10-CM | POA: Diagnosis not present

## 2018-01-29 DIAGNOSIS — E119 Type 2 diabetes mellitus without complications: Secondary | ICD-10-CM | POA: Diagnosis not present

## 2018-01-29 DIAGNOSIS — J90 Pleural effusion, not elsewhere classified: Secondary | ICD-10-CM | POA: Diagnosis not present

## 2018-01-29 DIAGNOSIS — I214 Non-ST elevation (NSTEMI) myocardial infarction: Principal | ICD-10-CM | POA: Diagnosis present

## 2018-01-29 DIAGNOSIS — R0689 Other abnormalities of breathing: Secondary | ICD-10-CM | POA: Diagnosis not present

## 2018-01-29 DIAGNOSIS — Z8673 Personal history of transient ischemic attack (TIA), and cerebral infarction without residual deficits: Secondary | ICD-10-CM | POA: Diagnosis not present

## 2018-01-29 DIAGNOSIS — I255 Ischemic cardiomyopathy: Secondary | ICD-10-CM | POA: Diagnosis present

## 2018-01-29 DIAGNOSIS — R000 Tachycardia, unspecified: Secondary | ICD-10-CM | POA: Diagnosis not present

## 2018-01-29 DIAGNOSIS — Z79899 Other long term (current) drug therapy: Secondary | ICD-10-CM | POA: Diagnosis not present

## 2018-01-29 DIAGNOSIS — J9601 Acute respiratory failure with hypoxia: Secondary | ICD-10-CM | POA: Diagnosis present

## 2018-01-29 DIAGNOSIS — I452 Bifascicular block: Secondary | ICD-10-CM | POA: Diagnosis present

## 2018-01-29 DIAGNOSIS — Z791 Long term (current) use of non-steroidal anti-inflammatories (NSAID): Secondary | ICD-10-CM | POA: Diagnosis not present

## 2018-01-29 DIAGNOSIS — Z79891 Long term (current) use of opiate analgesic: Secondary | ICD-10-CM | POA: Diagnosis not present

## 2018-01-29 DIAGNOSIS — E1165 Type 2 diabetes mellitus with hyperglycemia: Secondary | ICD-10-CM | POA: Diagnosis present

## 2018-01-29 DIAGNOSIS — Z9889 Other specified postprocedural states: Secondary | ICD-10-CM | POA: Diagnosis not present

## 2018-01-29 DIAGNOSIS — R9431 Abnormal electrocardiogram [ECG] [EKG]: Secondary | ICD-10-CM | POA: Diagnosis not present

## 2018-01-29 DIAGNOSIS — I110 Hypertensive heart disease with heart failure: Secondary | ICD-10-CM | POA: Diagnosis present

## 2018-01-29 DIAGNOSIS — R0902 Hypoxemia: Secondary | ICD-10-CM | POA: Diagnosis not present

## 2018-01-29 DIAGNOSIS — I340 Nonrheumatic mitral (valve) insufficiency: Secondary | ICD-10-CM

## 2018-01-29 DIAGNOSIS — I361 Nonrheumatic tricuspid (valve) insufficiency: Secondary | ICD-10-CM

## 2018-01-29 DIAGNOSIS — I10 Essential (primary) hypertension: Secondary | ICD-10-CM | POA: Diagnosis not present

## 2018-01-29 DIAGNOSIS — R7989 Other specified abnormal findings of blood chemistry: Secondary | ICD-10-CM | POA: Diagnosis not present

## 2018-01-29 DIAGNOSIS — J189 Pneumonia, unspecified organism: Secondary | ICD-10-CM | POA: Diagnosis present

## 2018-01-29 DIAGNOSIS — R069 Unspecified abnormalities of breathing: Secondary | ICD-10-CM | POA: Diagnosis not present

## 2018-01-29 DIAGNOSIS — Z716 Tobacco abuse counseling: Secondary | ICD-10-CM | POA: Diagnosis not present

## 2018-01-29 DIAGNOSIS — M797 Fibromyalgia: Secondary | ICD-10-CM | POA: Diagnosis present

## 2018-01-29 DIAGNOSIS — Z90710 Acquired absence of both cervix and uterus: Secondary | ICD-10-CM | POA: Diagnosis not present

## 2018-01-29 DIAGNOSIS — I509 Heart failure, unspecified: Secondary | ICD-10-CM | POA: Diagnosis not present

## 2018-01-29 HISTORY — DX: Cerebral infarction, unspecified: I63.9

## 2018-01-29 HISTORY — DX: Fibromyalgia: M79.7

## 2018-01-29 LAB — LIPID PANEL
Cholesterol: 286 mg/dL — ABNORMAL HIGH (ref 0–200)
HDL: 35 mg/dL — ABNORMAL LOW (ref >40)
LDL CALC: 216 mg/dL — AB (ref 0–99)
Total CHOL/HDL Ratio: 8.2 RATIO
Triglycerides: 173 mg/dL — ABNORMAL HIGH (ref <150)
VLDL: 35 mg/dL (ref 0–40)

## 2018-01-29 LAB — COMPREHENSIVE METABOLIC PANEL
ALBUMIN: 2.8 g/dL — AB (ref 3.5–5.0)
ALT: 74 U/L — ABNORMAL HIGH (ref 0–44)
ANION GAP: 7 (ref 5–15)
AST: 51 U/L — ABNORMAL HIGH (ref 15–41)
Alkaline Phosphatase: 194 U/L — ABNORMAL HIGH (ref 38–126)
BUN: 24 mg/dL — ABNORMAL HIGH (ref 6–20)
CHLORIDE: 95 mmol/L — AB (ref 98–111)
CO2: 31 mmol/L (ref 22–32)
Calcium: 8.5 mg/dL — ABNORMAL LOW (ref 8.9–10.3)
Creatinine, Ser: 0.93 mg/dL (ref 0.44–1.00)
GFR calc non Af Amer: >60 mL/min (ref >60)
GLUCOSE: 243 mg/dL — AB (ref 70–99)
Potassium: 3.7 mmol/L (ref 3.5–5.1)
SODIUM: 133 mmol/L — AB (ref 135–145)
Total Bilirubin: 0.5 mg/dL (ref 0.3–1.2)
Total Protein: 6.4 g/dL — ABNORMAL LOW (ref 6.5–8.1)

## 2018-01-29 LAB — GLUCOSE, CAPILLARY
GLUCOSE-CAPILLARY: 227 mg/dL — AB (ref 70–99)
GLUCOSE-CAPILLARY: 249 mg/dL — AB (ref 70–99)
Glucose-Capillary: 165 mg/dL — ABNORMAL HIGH (ref 70–99)

## 2018-01-29 LAB — CBC
HCT: 36.7 % (ref 36.0–46.0)
HEMOGLOBIN: 12.2 g/dL (ref 12.0–15.0)
MCH: 30.8 pg (ref 26.0–34.0)
MCHC: 33.2 g/dL (ref 30.0–36.0)
MCV: 92.7 fL (ref 80.0–100.0)
PLATELETS: 306 10*3/uL (ref 150–400)
RBC: 3.96 MIL/uL (ref 3.87–5.11)
RDW: 12.6 % (ref 11.5–15.5)
WBC: 11.1 10*3/uL — AB (ref 4.0–10.5)
nRBC: 0 % (ref 0.0–0.2)

## 2018-01-29 LAB — HEMOGLOBIN A1C
Hgb A1c MFr Bld: 9.7 % — ABNORMAL HIGH (ref 4.8–5.6)
MEAN PLASMA GLUCOSE: 231.69 mg/dL

## 2018-01-29 LAB — APTT: aPTT: 30 seconds (ref 24–36)

## 2018-01-29 LAB — TROPONIN I
Troponin I: 0.71 ng/mL (ref <0.03)
Troponin I: 0.98 ng/mL (ref <0.03)

## 2018-01-29 LAB — TSH: TSH: 2.745 u[IU]/mL (ref 0.350–4.500)

## 2018-01-29 LAB — PROTIME-INR
INR: 0.91
PROTHROMBIN TIME: 12.2 s (ref 11.4–15.2)

## 2018-01-29 LAB — HEPARIN LEVEL (UNFRACTIONATED): HEPARIN UNFRACTIONATED: 0.32 [IU]/mL (ref 0.30–0.70)

## 2018-01-29 MED ORDER — DIAZEPAM 5 MG PO TABS
5.0000 mg | ORAL_TABLET | Freq: Four times a day (QID) | ORAL | Status: DC | PRN
  Administered 2018-01-30 – 2018-02-01 (×2): 5 mg via ORAL
  Filled 2018-01-29 (×2): qty 1

## 2018-01-29 MED ORDER — FUROSEMIDE 10 MG/ML IJ SOLN
40.0000 mg | Freq: Once | INTRAMUSCULAR | Status: AC
  Administered 2018-01-29: 40 mg via INTRAVENOUS
  Filled 2018-01-29: qty 4

## 2018-01-29 MED ORDER — ASPIRIN 81 MG PO CHEW
81.0000 mg | CHEWABLE_TABLET | Freq: Every day | ORAL | Status: DC
  Administered 2018-01-30 – 2018-02-01 (×2): 81 mg via ORAL
  Filled 2018-01-29 (×2): qty 1

## 2018-01-29 MED ORDER — MORPHINE SULFATE (PF) 2 MG/ML IV SOLN: 2.0000 mg | INTRAVENOUS | Status: DC | PRN

## 2018-01-29 MED ORDER — FUROSEMIDE 10 MG/ML IJ SOLN
20.0000 mg | Freq: Two times a day (BID) | INTRAMUSCULAR | Status: DC
  Administered 2018-01-30 – 2018-02-01 (×5): 20 mg via INTRAVENOUS
  Filled 2018-01-29 (×5): qty 2

## 2018-01-29 MED ORDER — SODIUM CHLORIDE 0.9 % IV SOLN
500.0000 mg | Freq: Once | INTRAVENOUS | Status: AC
  Administered 2018-01-29: 500 mg via INTRAVENOUS
  Filled 2018-01-29: qty 500

## 2018-01-29 MED ORDER — SODIUM CHLORIDE 0.9 % IV SOLN
500.0000 mg | INTRAVENOUS | Status: DC
  Filled 2018-01-29: qty 500

## 2018-01-29 MED ORDER — METOPROLOL SUCCINATE ER 25 MG PO TB24
25.0000 mg | ORAL_TABLET | Freq: Every day | ORAL | Status: DC
  Administered 2018-01-29 – 2018-01-30 (×2): 25 mg via ORAL
  Filled 2018-01-29 (×2): qty 1

## 2018-01-29 MED ORDER — SODIUM CHLORIDE 0.9% FLUSH: 3.0000 mL | INTRAVENOUS | Status: DC | PRN

## 2018-01-29 MED ORDER — BISACODYL 10 MG RE SUPP: 10.0000 mg | Freq: Every day | RECTAL | Status: DC | PRN

## 2018-01-29 MED ORDER — ONDANSETRON HCL 4 MG PO TABS: 4.0000 mg | ORAL_TABLET | Freq: Four times a day (QID) | ORAL | Status: DC | PRN

## 2018-01-29 MED ORDER — SODIUM CHLORIDE 0.9 % IV SOLN: 250.0000 mL | INTRAVENOUS | Status: DC | PRN

## 2018-01-29 MED ORDER — ONDANSETRON HCL 4 MG/2ML IJ SOLN: 4.0000 mg | Freq: Four times a day (QID) | INTRAMUSCULAR | Status: DC | PRN

## 2018-01-29 MED ORDER — OXYCODONE-ACETAMINOPHEN 7.5-325 MG PO TABS
1.0000 | ORAL_TABLET | ORAL | Status: DC | PRN
  Administered 2018-01-29 – 2018-01-31 (×4): 1 via ORAL
  Filled 2018-01-29 (×4): qty 1

## 2018-01-29 MED ORDER — SODIUM CHLORIDE 0.9 % IV SOLN
1.0000 g | Freq: Once | INTRAVENOUS | Status: AC
  Filled 2018-01-29 (×2): qty 10

## 2018-01-29 MED ORDER — LISINOPRIL 20 MG PO TABS
20.0000 mg | ORAL_TABLET | Freq: Every day | ORAL | Status: DC
  Administered 2018-01-29 – 2018-01-30 (×2): 20 mg via ORAL
  Filled 2018-01-29 (×2): qty 1

## 2018-01-29 MED ORDER — POLYETHYLENE GLYCOL 3350 17 G PO PACK: 17.0000 g | PACK | Freq: Every day | ORAL | Status: DC | PRN

## 2018-01-29 MED ORDER — SODIUM CHLORIDE 0.9 % IV SOLN
1.0000 g | INTRAVENOUS | Status: DC
  Administered 2018-01-30: 1 g via INTRAVENOUS
  Filled 2018-01-29: qty 10
  Filled 2018-01-29: qty 1
  Filled 2018-01-29: qty 10

## 2018-01-29 MED ORDER — GABAPENTIN 300 MG PO CAPS
300.0000 mg | ORAL_CAPSULE | Freq: Three times a day (TID) | ORAL | Status: DC
  Administered 2018-01-29 – 2018-02-01 (×8): 300 mg via ORAL
  Filled 2018-01-29 (×8): qty 1

## 2018-01-29 MED ORDER — HEPARIN (PORCINE) 25000 UT/250ML-% IV SOLN
1400.0000 [IU]/h | INTRAVENOUS | Status: DC
  Administered 2018-01-29: 850 [IU]/h via INTRAVENOUS
  Administered 2018-01-30: 1000 [IU]/h via INTRAVENOUS
  Administered 2018-01-31: 1400 [IU]/h via INTRAVENOUS
  Filled 2018-01-29 (×4): qty 250

## 2018-01-29 MED ORDER — CYCLOBENZAPRINE HCL 10 MG PO TABS
10.0000 mg | ORAL_TABLET | Freq: Three times a day (TID) | ORAL | Status: DC | PRN
  Administered 2018-01-31: 10 mg via ORAL
  Filled 2018-01-29: qty 1

## 2018-01-29 MED ORDER — SODIUM CHLORIDE 0.9% FLUSH
3.0000 mL | Freq: Two times a day (BID) | INTRAVENOUS | Status: DC
  Administered 2018-01-29 – 2018-01-31 (×2): 3 mL via INTRAVENOUS

## 2018-01-29 MED ORDER — SODIUM CHLORIDE 0.9 % IV SOLN: 1.0000 g | INTRAVENOUS | Status: DC

## 2018-01-29 MED ORDER — HEPARIN BOLUS VIA INFUSION
4000.0000 [IU] | Freq: Once | INTRAVENOUS | Status: AC
  Administered 2018-01-29: 4000 [IU] via INTRAVENOUS
  Filled 2018-01-29: qty 4000

## 2018-01-29 MED ORDER — FLEET ENEMA 7-19 GM/118ML RE ENEM: 1.0000 | ENEMA | Freq: Once | RECTAL | Status: DC | PRN

## 2018-01-29 MED ORDER — SIMVASTATIN 20 MG PO TABS
20.0000 mg | ORAL_TABLET | Freq: Every day | ORAL | Status: DC
  Administered 2018-01-29 – 2018-01-30 (×2): 20 mg via ORAL
  Filled 2018-01-29 (×2): qty 1

## 2018-01-29 MED ORDER — INSULIN ASPART 100 UNIT/ML ~~LOC~~ SOLN
0.0000 [IU] | Freq: Three times a day (TID) | SUBCUTANEOUS | Status: DC
  Administered 2018-01-30 – 2018-02-01 (×5): 3 [IU] via SUBCUTANEOUS
  Administered 2018-02-01: 2 [IU] via SUBCUTANEOUS
  Administered 2018-02-01: 3 [IU] via SUBCUTANEOUS
  Filled 2018-01-29 (×7): qty 1

## 2018-01-29 NOTE — ED Notes (Signed)
Pt returned from radiology at this time.  

## 2018-01-29 NOTE — ED Triage Notes (Signed)
Pt presents to ED via [facility] with c/o SOB since 11/27, pt also with abdominal distention, per EMS palpable mass to LUQ. EMS reports pt 91% on RA, states the only relief she has is when she is standing. 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

## 2018-01-29 NOTE — ED Notes (Signed)
Resumed care from [place] rn  Pt alert.  Pt on 2 liters oxygen.  2nd iv started.  meds given.  nsr on monitor.  External catheter in place.

## 2018-01-29 NOTE — ED Notes (Signed)
Pt alert.  Iv meds infusing.    Pt waiting on admission

## 2018-01-29 NOTE — Consult Note (Signed)
ANTICOAGULATION CONSULT NOTE - Initial Consult  Pharmacy Consult for Heparin Indication: chest pain/ACS  Allergies  Allergen Reactions  . Bactrim [Sulfamethoxazole-Trimethoprim] Nausea And Vomiting  . Relafen [Nabumetone]     Patient Measurements: Height: 5\' 6"  (167.6 cm) Weight: 158 lb (71.7 kg) IBW/kg (Calculated) : 59.3 Heparin Dosing Weight: 71.7kg  Vital Signs: Temp: 97.5 F (36.4 C) (12/03 1334) Temp Source: Oral (12/03 1334) BP: 107/92 (12/03 1334) Pulse Rate: 54 (12/03 1334)  Labs: Recent Labs    01/29/18 1335  HGB 12.2  HCT 36.7  PLT 306  CREATININE 0.93  TROPONINI 0.71*    Estimated Creatinine Clearance: 66.9 mL/min (by C-G formula based on SCr of 0.93 mg/dL).   Medical History: Past Medical History:  Diagnosis Date  . Diabetes mellitus without complication ([place])   . Fibromyalgia   . Hypertension   . Leg cramping   . Nerve pain   . Stroke [facility])     Medications:  No anticoagulants pta  Assessment: Pharmacy has been consulted for Heparin dosing for ACS/STEMI Goal of Therapy:  Heparin level 0.3-0.7 units/ml Monitor platelets by anticoagulation protocol: Yes   Plan:  Give 4000 units bolus x 1 then 850units/hr - Baseline CBCs, aPtt, and INR ordered  Following HL on 12/03 @ 2100 (6 hour follow-up)  Following CBC's daily while on heparin  Lu Duffel, PharmD Clinical Pharmacist 01/29/2018 3:11 PM

## 2018-01-29 NOTE — ED Provider Notes (Signed)
[facility] Emergency Department Provider Note   ____________________________________________    I have reviewed the triage vital signs and the nursing notes.   HISTORY  Chief Complaint Shortness of Breath and Abdominal Pain     HPI Colleen Peters is a 57 y.o. female with a history of diabetes, fibromyalgia who presents with complaints of abdominal bloating which she feels is making her have mild shortness of breath.  She denies fevers or chills.  No nausea or vomiting.  No cough.  No pleurisy.  No chest pain.  She reports over the last week she has had more constipation than usual and has felt bloated.  She does report that she had a bowel movement 2 days ago but continues to feel mildly bloated.  Has been taking senna and magnesium citrate with success   Past Medical History:  Diagnosis Date  . Diabetes mellitus without complication ([place])   . Fibromyalgia   . Hypertension   . Leg cramping   . Nerve pain   . Stroke [facility])     There are no active problems to display for this patient.   Past Surgical History:  Procedure Laterality Date  . ABDOMINAL HYSTERECTOMY    . APPENDECTOMY    . BACK SURGERY    . bone spur disease    . CHOLECYSTECTOMY    . lt elebow surgery  2008  . ovarian surgery for cyst    . Pacemaker to bladder    . rt great toe surgery      Prior to Admission medications   Medication Sig Start Date End Date Taking? Authorizing Provider  cyclobenzaprine (FLEXERIL) 10 MG tablet Take 10 mg by mouth 3 (three) times daily as needed for muscle spasms.    [provider]  diazepam (VALIUM) 5 MG tablet Take 5 mg by mouth every 6 (six) hours as needed for anxiety.    [provider]  gabapentin (NEURONTIN) 300 MG capsule Take 300 mg by mouth 3 (three) times daily.    [provider]  hydrochlorothiazide (HYDRODIURIL) 25 MG tablet Take 25 mg by mouth daily.    [provider]  insulin aspart  protamine- aspart (NOVOLOG MIX 70/30) (70-30) 100 UNIT/ML injection Inject into the skin.    [provider]  lisinopril (PRINIVIL,ZESTRIL) 20 MG tablet Take 20 mg by mouth daily.    [provider]  meloxicam (MOBIC) 15 MG tablet Take 15 mg by mouth daily.    [provider]  metFORMIN (GLUCOPHAGE) 500 MG tablet Take 500 mg by mouth 2 (two) times daily with a meal.    [provider]  metoprolol succinate (TOPROL-XL) 25 MG 24 hr tablet Take 25 mg by mouth daily.    [provider]  oxyCODONE-acetaminophen (PERCOCET) 7.5-325 MG tablet Take 1 tablet by mouth every 4 (four) hours as needed for severe pain.    [provider]  simvastatin (ZOCOR) 20 MG tablet Take 20 mg by mouth daily.    [provider]     Allergies Bactrim [sulfamethoxazole-trimethoprim] and Relafen [nabumetone]  History reviewed. No pertinent family history.  Social History Social History   Tobacco Use  . Smoking status: Current Every Day Smoker    Packs/day: 1.00    Types: Cigarettes  . Smokeless tobacco: Never Used  Substance Use Topics  . Alcohol use: No  . Drug use: No    Review of Systems  Constitutional: No fever/chills Eyes: No visual changes.  ENT:  No sore throat. Cardiovascular: Denies chest pain. Respiratory: As above Gastrointestinal: As above Genitourinary: Negative for dysuria. Musculoskeletal: Negative for back pain. Skin: Negative for rash. Neurological: Negative for headaches   ____________________________________________   PHYSICAL EXAM:  VITAL SIGNS: ED Triage Vitals  Enc Vitals Group     BP 01/29/18 1334 (!) 107/92     Pulse Rate 01/29/18 1334 (!) 54     Resp 01/29/18 1334 (!) 21     Temp 01/29/18 1334 (!) 97.5 F (36.4 C)     Temp Source 01/29/18 1334 Oral     SpO2 01/29/18 1334 95 %     Weight 01/29/18 1331 71.7 kg (158 lb)     Height 01/29/18 1331 1.676 m (5\' 6" )     Head Circumference --      Peak Flow --       Pain Score 01/29/18 1331 8     Pain Loc --      Pain Edu? --      Excl. in [place]? --     Constitutional: Alert and oriented. No acute distress.   Nose: No congestion/rhinnorhea. Mouth/Throat: Mucous membranes are moist.    Cardiovascular: Normal rate, regular rhythm. Grossly normal heart sounds.  Good peripheral circulation. Respiratory: Normal respiratory effort.  No retractions. Lungs CTAB. Gastrointestinal: Soft and nontender. No distention.  No CVA tenderness.  Reassuring exam  Musculoskeletal: No lower extremity tenderness nor edema.  Warm and well perfused Neurologic:  Normal speech and language. No gross focal neurologic deficits are appreciated.  Skin:  Skin is warm, dry and intact. No rash noted. Psychiatric: Mood and affect are normal. Speech and behavior are normal.  ____________________________________________   LABS (all labs ordered are listed, but only abnormal results are displayed)  Labs Reviewed  GLUCOSE, CAPILLARY - Abnormal; Notable for the following components:      Result Value   Glucose-Capillary 227 (*)    All other components within normal limits  CBC - Abnormal; Notable for the following components:   WBC 11.1 (*)    All other components within normal limits  COMPREHENSIVE METABOLIC PANEL - Abnormal; Notable for the following components:   Sodium 133 (*)    Chloride 95 (*)    Glucose, Bld 243 (*)    BUN 24 (*)    Calcium 8.5 (*)    Total Protein 6.4 (*)    Albumin 2.8 (*)    AST 51 (*)    ALT 74 (*)    Alkaline Phosphatase 194 (*)    All other components within normal limits  TROPONIN I - Abnormal; Notable for the following components:   Troponin I 0.71 (*)    All other components within normal limits  CULTURE, BLOOD (ROUTINE X 2)  CULTURE, BLOOD (ROUTINE X 2)   ____________________________________________  EKG  ED ECG REPORT I, Lavonia Drafts, the attending physician, personally viewed and interpreted this ECG.  Date:  01/29/2018  Rhythm: Sinus tachycardia QRS Axis: normal Intervals: Nonspecific blocks ST/T Wave abnormalities: normal   ____________________________________________  RADIOLOGY  KUB X-ray ____________________________________________   PROCEDURES  Procedure(s) performed: No  Procedures   Critical Care performed: yes  CRITICAL CARE Performed by: Lavonia Drafts   Total critical care time: 30 minutes  Critical care time was exclusive of separately billable procedures and treating other patients.  Critical care was necessary to treat or prevent imminent or life-threatening deterioration.  Critical care was time spent personally by me on the following activities: development of treatment plan with  patient and/or surrogate as well as nursing, discussions with consultants, evaluation of patient's response to treatment, examination of patient, obtaining history from patient or surrogate, ordering and performing treatments and interventions, ordering and review of laboratory studies, ordering and review of radiographic studies, pulse oximetry and re-evaluation of patient's condition.  ____________________________________________   INITIAL IMPRESSION / ASSESSMENT AND PLAN / ED COURSE  Pertinent labs & imaging results that were available during my care of the patient were reviewed by me and considered in my medical decision making (see chart for details).  Patient overall well-appearing, complains primarily of bloated feeling which is "pushing on her diaphragm ".  Abdominal exam is overall reassuring.  Will check labs, including troponin, chest x-ray, KUB and reevaluate.  EKG is nonischemic.   ----------------------------------------- 2:49 PM on 01/29/2018 -----------------------------------------  Notified of elevated troponin of 0.71, consistent with non-ST elevation MI.  This is likely the cause of her pressure sensation around her diaphragm.  Patient will require heparin  drip and admission    ____________________________________________   FINAL CLINICAL IMPRESSION(S) / ED DIAGNOSES  Final diagnoses:  Community acquired pneumonia, unspecified laterality  NSTEMI (non-ST elevated myocardial infarction) ([place])        Note:  This document was prepared using Dragon voice recognition software and may include unintentional dictation errors.    Lavonia Drafts, MD 01/29/18 [phone]

## 2018-01-29 NOTE — ED Notes (Signed)
Pt reports last BM last night after constipation for several days, and last urination approx 1230 today however reports "i'm not peeing as well as I should because my batteries are dead in my pacemaker", pt has hx of pacemaker to bladder due to nerve damage.

## 2018-01-29 NOTE — ED Notes (Signed)
Report called to [place] rn floor nurse

## 2018-01-29 NOTE — H&P (Signed)
[place] [facility] [place] NAME: Colleen Peters    MR#:  [id number]  DATE OF BIRTH:  1960-04-02  DATE OF ADMISSION:  01/29/2018  PRIMARY CARE PHYSICIAN: Colleen Athens, MD   REQUESTING/REFERRING PHYSICIAN: dr Jimmye Norman  CHIEF COMPLAINT:   SOB HISTORY OF PRESENT ILLNESS:  Colleen Peters  is a 57 y.o. female with a known history of CVA, diabetes and chronic systolic heart failure with unknown ejection fraction who presents today to the emergency room due to shortness of breath.  Patient reports over the past week she has had shortness of breath, PND, orthopnea and cough which has been nonproductive.  She denies fever or chills.  In the emergency room she was noted to have pleural effusions and a pneumonia as well as elevated troponin.  Patient denies chest pain or pressure.  She also endorses lower extremity edema and weight gain over the past week.  She says she has a weak heart but she is unsure what the ejection fraction is.  She says her primary care physician was going to put her on Entresto but she could not afford this.  PAST MEDICAL HISTORY:   Past Medical History:  Diagnosis Date  . Diabetes mellitus without complication ([place])   . Fibromyalgia   . Hypertension   . Leg cramping   . Nerve pain   . Stroke [facility])     PAST SURGICAL HISTORY:   Past Surgical History:  Procedure Laterality Date  . ABDOMINAL HYSTERECTOMY    . APPENDECTOMY    . BACK SURGERY    . bone spur disease    . CHOLECYSTECTOMY    . lt elebow surgery  2008  . ovarian surgery for cyst    . Pacemaker to bladder    . rt great toe surgery      SOCIAL HISTORY:   Social History   Tobacco Use  . Smoking status: Current Every Day Smoker    Packs/day: 1.00    Types: Cigarettes  . Smokeless tobacco: Never Used  Substance Use Topics  . Alcohol use: No    FAMILY HISTORY:  History reviewed. No pertinent family history.  DRUG ALLERGIES:   Allergies  Allergen  Reactions  . Bactrim [Sulfamethoxazole-Trimethoprim] Nausea And Vomiting  . Relafen [Nabumetone]     REVIEW OF SYSTEMS:   Review of Systems  Constitutional: Positive for malaise/fatigue. Negative for chills and fever.  HENT: Negative.  Negative for ear discharge, ear pain, hearing loss, nosebleeds and sore throat.   Eyes: Negative.  Negative for blurred vision and pain.  Respiratory: Positive for cough and shortness of breath. Negative for hemoptysis and wheezing.   Cardiovascular: Positive for orthopnea, leg swelling and PND. Negative for chest pain and palpitations.  Gastrointestinal: Negative.  Negative for abdominal pain, blood in stool, diarrhea, nausea and vomiting.  Genitourinary: Negative.  Negative for dysuria.  Musculoskeletal: Negative.  Negative for back pain.  Skin: Negative.   Neurological: Negative for dizziness, tremors, speech change, focal weakness, seizures and headaches.  Endo/Heme/Allergies: Negative.  Does not bruise/bleed easily.  Psychiatric/Behavioral: Negative.  Negative for depression, hallucinations and suicidal ideas.    MEDICATIONS AT HOME:   Prior to Admission medications   Medication Sig Start Date End Date Taking? Authorizing Provider  cyclobenzaprine (FLEXERIL) 10 MG tablet Take 10 mg by mouth 3 (three) times daily as needed for muscle spasms.    [provider]  diazepam (VALIUM) 5 MG tablet Take 5 mg by mouth every 6 (  six) hours as needed for anxiety.    [provider]  gabapentin (NEURONTIN) 300 MG capsule Take 300 mg by mouth 3 (three) times daily.    [provider]  hydrochlorothiazide (HYDRODIURIL) 25 MG tablet Take 25 mg by mouth daily.    [provider]  insulin aspart protamine- aspart (NOVOLOG MIX 70/30) (70-30) 100 UNIT/ML injection Inject into the skin.    [provider]  lisinopril (PRINIVIL,ZESTRIL) 20 MG tablet Take 20 mg by mouth daily.    [provider]  meloxicam (MOBIC) 15  MG tablet Take 15 mg by mouth daily.    [provider]  metFORMIN (GLUCOPHAGE) 500 MG tablet Take 500 mg by mouth 2 (two) times daily with a meal.    [provider]  metoprolol succinate (TOPROL-XL) 25 MG 24 hr tablet Take 25 mg by mouth daily.    [provider]  oxyCODONE-acetaminophen (PERCOCET) 7.5-325 MG tablet Take 1 tablet by mouth every 4 (four) hours as needed for severe pain.    [provider]  simvastatin (ZOCOR) 20 MG tablet Take 20 mg by mouth daily.    [provider]      VITAL SIGNS:  Blood pressure (!) 107/92, pulse (!) 54, temperature (!) 97.5 F (36.4 C), temperature source Oral, resp. rate (!) 21, height 5\' 6"  (1.676 m), weight 71.7 kg, SpO2 95 %.  PHYSICAL EXAMINATION:   Physical Exam  Constitutional: She is oriented to person, place, and time. No distress.  Sitting upright with nasal cannula  HENT:  Head: Normocephalic.  Eyes: No scleral icterus.  Neck: Normal range of motion. Neck supple. JVD present. No tracheal deviation present.  Cardiovascular: Normal rate and regular rhythm. Exam reveals no gallop and no friction rub.  Murmur heard. Pulses:      Carotid pulses are on the left side with bruit. Pulmonary/Chest: Effort normal. No accessory muscle usage. No respiratory distress. She has decreased breath sounds in the right lower field and the left lower field. She has no wheezes. She has rales in the right middle field and the left middle field. She exhibits no tenderness.  Abdominal: Soft. Bowel sounds are normal. She exhibits distension. She exhibits no mass. There is no tenderness. There is no rebound and no guarding.  Musculoskeletal: Normal range of motion.       Right lower leg: She exhibits edema. She exhibits no tenderness.       Left lower leg: She exhibits tenderness and edema.  Neurological: She is alert and oriented to person, place, and time.  Skin: Skin is warm. No rash noted. No erythema.   Psychiatric: Judgment normal.      LABORATORY PANEL:   CBC Recent Labs  Lab 01/29/18 1335  WBC 11.1*  HGB 12.2  HCT 36.7  PLT 306   ------------------------------------------------------------------------------------------------------------------  Chemistries  Recent Labs  Lab 01/29/18 1335  NA 133*  K 3.7  CL 95*  CO2 31  GLUCOSE 243*  BUN 24*  CREATININE 0.93  CALCIUM 8.5*  AST 51*  ALT 74*  ALKPHOS 194*  BILITOT 0.5   ------------------------------------------------------------------------------------------------------------------  Cardiac Enzymes Recent Labs  Lab 01/29/18 1335  TROPONINI 0.71*   ------------------------------------------------------------------------------------------------------------------  RADIOLOGY:  Dg Chest 2 View  Result Date: 01/29/2018 CLINICAL DATA:  Initial evaluation for acute shortness of breath, abdominal distension. EXAM: CHEST - 2 VIEW COMPARISON:  Prior radiograph from 04/12/2016. FINDINGS: Mild cardiomegaly, stable. Mediastinal silhouette within normal limits. Aortic atherosclerosis. Lungs mildly hypoinflated. Mild perihilar vascular and  interstitial congestion without overt pulmonary edema. Patchy multifocal bibasilar opacities, which could reflect atelectasis and/or infiltrates. Associated small bilateral pleural effusions. No pneumothorax. No acute osseous abnormality. IMPRESSION: 1. Patchy multifocal bibasilar opacities, which could reflect atelectasis and/or infiltrates. 2. Small bilateral pleural effusions. 3. Cardiomegaly with mild perihilar vascular congestion without overt pulmonary edema. 4. Aortic atherosclerosis. Electronically Signed   By: Jeannine Boga M.D.   On: 01/29/2018 14:52   Dg Abdomen 1 View  Result Date: 01/29/2018 CLINICAL DATA:  Initial evaluation for acute abdominal distension. EXAM: ABDOMEN - 1 VIEW COMPARISON:  Prior CT from 11/09/2010 FINDINGS: Bowel gas pattern within normal limits  without obstruction or ileus. Scattered retain enteric contrast material within the distal colon and rectum. Overall stool burden is mild. No appreciable abnormal bowel wall thickening. No free air. No soft tissue mass or abnormal calcification. Generator for sacral stimulator overlies the left lower quadrant. No acute osseous abnormality. Osteoarthritic changes noted about the hips. IMPRESSION: Nonobstructive bowel gas pattern with no radiographic evidence for acute intra-abdominal process. Electronically Signed   By: Jeannine Boga M.D.   On: 01/29/2018 14:54    EKG:  Sinus tachycardia no ST elevation or depression  IMPRESSION AND PLAN:   57 year old female with history of diabetes and "weak heart" with chronic systolic heart failure who presents emergency room due to shortness of breath.  1.  Acute hypoxic respiratory failure in the setting of community-acquired pneumonia and acute on chronic systolic heart failure Wean oxygen as tolerated  2.  Acute on chronic systolic heart failure: Lasix 20 IV twice daily Follow intake and output with daily weight Cardiology consultation requested and ER physician spoke with Dr. Humphrey Rolls Continue metoprolol and lisinopril Order echocardiogram to evaluate ejection fraction As per patient she was supposed to be on Entresto however could not afford this  3.  Community-acquired pneumonia: Continue Rocephin and azithromycin  4.  Hyponatremia: This is from CHF and mild hyperglycemia  5.  Elevated troponin: Continue telemetry monitoring of troponins Continue heparin drip patient consulted by ER physician to start this Follow-up on cardiology consultation Continue aspirin and statin Check A1c and lipid panel  6.  Diabetes: Start sliding scale  7.  Essential hypertension: Continue lisinopril and metoprolol Hold HCTZ to allow room for Lasix  8.  History of CVA: Continue aspirin and statin  9. Tobacco dependence: Patient is encouraged to quit  smoking. Counseling was provided for 4 minutes.     All the records are reviewed and case discussed with ED provider. Management plans discussed with the patient and she is in agreement  CODE STATUS: full  TOTAL TIME TAKING CARE OF THIS PATIENT: 48 minutes.    Vanesa Renier M.D on 01/29/2018 at 3:11 PM  Between 7am to 6pm - Pager - [phone]  After 6pm go to [email or web address] - password [email or web address] [facility]  Office  [phone]  CC: Primary care physician; Colleen Athens, MD

## 2018-01-30 ENCOUNTER — Other Ambulatory Visit: Payer: Self-pay | Admitting: Cardiovascular Disease

## 2018-01-30 LAB — BASIC METABOLIC PANEL
Anion gap: 6 (ref 5–15)
BUN: 26 mg/dL — AB (ref 6–20)
CALCIUM: 8.3 mg/dL — AB (ref 8.9–10.3)
CO2: 33 mmol/L — ABNORMAL HIGH (ref 22–32)
Chloride: 101 mmol/L (ref 98–111)
Creatinine, Ser: 1.3 mg/dL — ABNORMAL HIGH (ref 0.44–1.00)
GFR calc Af Amer: 52 mL/min — ABNORMAL LOW (ref >60)
GFR calc non Af Amer: 45 mL/min — ABNORMAL LOW (ref >60)
Glucose, Bld: 252 mg/dL — ABNORMAL HIGH (ref 70–99)
Potassium: 3.7 mmol/L (ref 3.5–5.1)
Sodium: 140 mmol/L (ref 135–145)

## 2018-01-30 LAB — CBC
HCT: 34.4 % — ABNORMAL LOW (ref 36.0–46.0)
Hemoglobin: 10.9 g/dL — ABNORMAL LOW (ref 12.0–15.0)
MCH: 30.4 pg (ref 26.0–34.0)
MCHC: 31.7 g/dL (ref 30.0–36.0)
MCV: 96.1 fL (ref 80.0–100.0)
Platelets: 275 10*3/uL (ref 150–400)
RBC: 3.58 MIL/uL — ABNORMAL LOW (ref 3.87–5.11)
RDW: 12.6 % (ref 11.5–15.5)
WBC: 9.4 10*3/uL (ref 4.0–10.5)
nRBC: 0 % (ref 0.0–0.2)

## 2018-01-30 LAB — GLUCOSE, CAPILLARY
GLUCOSE-CAPILLARY: 188 mg/dL — AB (ref 70–99)
Glucose-Capillary: 232 mg/dL — ABNORMAL HIGH (ref 70–99)
Glucose-Capillary: 130 mg/dL — ABNORMAL HIGH (ref 70–99)
Glucose-Capillary: 222 mg/dL — ABNORMAL HIGH (ref 70–99)

## 2018-01-30 LAB — TROPONIN I
TROPONIN I: 0.97 ng/mL — AB (ref <0.03)
Troponin I: 0.95 ng/mL (ref <0.03)

## 2018-01-30 LAB — ECHOCARDIOGRAM COMPLETE
HEIGHTINCHES: 66 in
Weight: 2528 oz

## 2018-01-30 LAB — HIV ANTIBODY (ROUTINE TESTING W REFLEX): HIV SCREEN 4TH GENERATION: NONREACTIVE

## 2018-01-30 LAB — HEPARIN LEVEL (UNFRACTIONATED)
Heparin Unfractionated: <0.1 IU/mL — ABNORMAL LOW (ref 0.30–0.70)
Heparin Unfractionated: 0.15 IU/mL — ABNORMAL LOW (ref 0.30–0.70)
Heparin Unfractionated: 0.24 IU/mL — ABNORMAL LOW (ref 0.30–0.70)

## 2018-01-30 MED ORDER — SODIUM CHLORIDE 0.9% FLUSH: 3.0000 mL | INTRAVENOUS | Status: DC | PRN

## 2018-01-30 MED ORDER — HEPARIN BOLUS VIA INFUSION
2100.0000 [IU] | Freq: Once | INTRAVENOUS | Status: AC
  Administered 2018-01-30: 2100 [IU] via INTRAVENOUS
  Filled 2018-01-30: qty 2100

## 2018-01-30 MED ORDER — HEPARIN BOLUS VIA INFUSION
2150.0000 [IU] | Freq: Once | INTRAVENOUS | Status: AC
  Administered 2018-01-30: 2150 [IU] via INTRAVENOUS
  Filled 2018-01-30: qty 2150

## 2018-01-30 MED ORDER — SODIUM CHLORIDE 0.9 % IV SOLN: 250.0000 mL | INTRAVENOUS | Status: DC | PRN

## 2018-01-30 MED ORDER — ASPIRIN 81 MG PO CHEW
81.0000 mg | CHEWABLE_TABLET | ORAL | Status: AC
  Administered 2018-01-31: 81 mg via ORAL
  Filled 2018-01-30: qty 1

## 2018-01-30 MED ORDER — SODIUM CHLORIDE 0.9% FLUSH
3.0000 mL | Freq: Two times a day (BID) | INTRAVENOUS | Status: DC
  Administered 2018-01-30: 3 mL via INTRAVENOUS

## 2018-01-30 MED ORDER — ATORVASTATIN CALCIUM 20 MG PO TABS
80.0000 mg | ORAL_TABLET | Freq: Every day | ORAL | Status: DC
  Administered 2018-01-31 – 2018-02-01 (×2): 80 mg via ORAL
  Filled 2018-01-30 (×2): qty 4

## 2018-01-30 MED ORDER — AZITHROMYCIN 250 MG PO TABS
500.0000 mg | ORAL_TABLET | Freq: Every day | ORAL | Status: DC
  Administered 2018-01-30 – 2018-02-01 (×3): 500 mg via ORAL
  Filled 2018-01-30 (×3): qty 2

## 2018-01-30 MED ORDER — EZETIMIBE 10 MG PO TABS
10.0000 mg | ORAL_TABLET | Freq: Every day | ORAL | Status: DC
  Administered 2018-01-30 – 2018-02-01 (×2): 10 mg via ORAL
  Filled 2018-01-30 (×2): qty 1

## 2018-01-30 MED ORDER — SODIUM CHLORIDE 0.9 % IV SOLN
INTRAVENOUS | Status: DC
  Administered 2018-01-31: 06:00:00 via INTRAVENOUS

## 2018-01-30 MED ORDER — HEPARIN BOLUS VIA INFUSION
1050.0000 [IU] | Freq: Once | INTRAVENOUS | Status: AC
  Administered 2018-01-30: 1050 [IU] via INTRAVENOUS
  Filled 2018-01-30: qty 1050

## 2018-01-30 NOTE — Progress Notes (Signed)
[place] at [place] NAME: Colleen Peters    MR#:  [id number]  DATE OF BIRTH:  1960-09-02  SUBJECTIVE:  CHIEF COMPLAINT:    REVIEW OF SYSTEMS:  CONSTITUTIONAL: No fever, fatigue or weakness.  EYES: No blurred or double vision.  EARS, NOSE, AND THROAT: No tinnitus or ear pain.  RESPIRATORY: No cough, shortness of breath, wheezing or hemoptysis.  CARDIOVASCULAR: No chest pain, orthopnea, edema.  GASTROINTESTINAL: No nausea, vomiting, diarrhea or abdominal pain.  GENITOURINARY: No dysuria, hematuria.  ENDOCRINE: No polyuria, nocturia,  HEMATOLOGY: No anemia, easy bruising or bleeding SKIN: No rash or lesion. MUSCULOSKELETAL: No joint pain or arthritis.   NEUROLOGIC: No tingling, numbness, weakness.  PSYCHIATRY: No anxiety or depression.   DRUG ALLERGIES:   Allergies  Allergen Reactions  . Bactrim [Sulfamethoxazole-Trimethoprim] Nausea And Vomiting  . Relafen [Nabumetone]     VITALS:  Blood pressure 129/63, pulse 84, temperature 97.8 F (36.6 C), temperature source Oral, resp. rate 18, height 5\' 6"  (1.676 m), weight 74 kg, SpO2 98 %.  PHYSICAL EXAMINATION:  GENERAL:  57 y.o.-year-old patient lying in the bed with no acute distress.  EYES: Pupils equal, round, reactive to light and accommodation. No scleral icterus. Extraocular muscles intact.  HEENT: Head atraumatic, normocephalic. Oropharynx and nasopharynx clear.  NECK:  Supple, no jugular venous distention. No thyroid enlargement, no tenderness.  LUNGS: Normal breath sounds bilaterally, no wheezing, rales,rhonchi or crepitation. No use of accessory muscles of respiration.  CARDIOVASCULAR: S1, S2 normal. No murmurs, rubs, or gallops.  ABDOMEN: Soft, nontender, nondistended. Bowel sounds present. No organomegaly or mass.  EXTREMITIES: No pedal edema, cyanosis, or clubbing.  NEUROLOGIC: Cranial nerves II through XII are intact. Muscle strength 5/5 in all extremities.  Sensation intact. Gait not checked.  PSYCHIATRIC: The patient is alert and oriented x 3.  SKIN: No obvious rash, lesion, or ulcer.    LABORATORY PANEL:   CBC Recent Labs  Lab 01/30/18 0547  WBC 9.4  HGB 10.9*  HCT 34.4*  PLT 275   ------------------------------------------------------------------------------------------------------------------  Chemistries  Recent Labs  Lab 01/29/18 1335 01/30/18 0547  NA 133* 140  K 3.7 3.7  CL 95* 101  CO2 31 33*  GLUCOSE 243* 252*  BUN 24* 26*  CREATININE 0.93 1.30*  CALCIUM 8.5* 8.3*  AST 51*  --   ALT 74*  --   ALKPHOS 194*  --   BILITOT 0.5  --    ------------------------------------------------------------------------------------------------------------------  Cardiac Enzymes Recent Labs  Lab 01/30/18 0547  TROPONINI 0.95*   ------------------------------------------------------------------------------------------------------------------  RADIOLOGY:  Dg Chest 2 View  Result Date: 01/29/2018 CLINICAL DATA:  Initial evaluation for acute shortness of breath, abdominal distension. EXAM: CHEST - 2 VIEW COMPARISON:  Prior radiograph from 04/12/2016. FINDINGS: Mild cardiomegaly, stable. Mediastinal silhouette within normal limits. Aortic atherosclerosis. Lungs mildly hypoinflated. Mild perihilar vascular and interstitial congestion without overt pulmonary edema. Patchy multifocal bibasilar opacities, which could reflect atelectasis and/or infiltrates. Associated small bilateral pleural effusions. No pneumothorax. No acute osseous abnormality. IMPRESSION: 1. Patchy multifocal bibasilar opacities, which could reflect atelectasis and/or infiltrates. 2. Small bilateral pleural effusions. 3. Cardiomegaly with mild perihilar vascular congestion without overt pulmonary edema. 4. Aortic atherosclerosis. Electronically Signed   By: Jeannine Boga M.D.   On: 01/29/2018 14:52   Dg Abdomen 1 View  Result Date: 01/29/2018 CLINICAL DATA:   Initial evaluation for acute abdominal distension. EXAM: ABDOMEN - 1 VIEW COMPARISON:  Prior CT from 11/09/2010 FINDINGS: Bowel gas pattern within  normal limits without obstruction or ileus. Scattered retain enteric contrast material within the distal colon and rectum. Overall stool burden is mild. No appreciable abnormal bowel wall thickening. No free air. No soft tissue mass or abnormal calcification. Generator for sacral stimulator overlies the left lower quadrant. No acute osseous abnormality. Osteoarthritic changes noted about the hips. IMPRESSION: Nonobstructive bowel gas pattern with no radiographic evidence for acute intra-abdominal process. Electronically Signed   By: Jeannine Boga M.D.   On: 01/29/2018 14:54    EKG:   Orders placed or performed during the hospital encounter of 01/29/18  . EKG 12-Lead  . EKG 12-Lead    ASSESSMENT AND PLAN:   57 year old female with history of diabetes and "weak heart" with chronic systolic heart failure who presents emergency room due to shortness of breath.  1.  Acute hypoxic respiratory failure in the setting of community-acquired pneumonia and acute on chronic systolic heart failure Wean oxygen as tolerated  2.  Acute on chronic systolic heart failure: Lasix 20 IV twice daily Follow intake and output with daily weight Seen by  Dr. Humphrey Rolls, f/u with him Continue metoprolol and lisinopril echocardiogram - 25 %ejection fraction.with severe LV systolic   dysfunction with diffuse hypokinesis. Mild MR/TR. As per patient she was supposed to be on Entresto however could not afford this  3.  Community-acquired pneumonia:  Continue Rocephin and azithromycin  4.  Hyponatremia: resolved  5.  NSTEMI  Elevated troponin: Continue telemetry monitoring  0.98-0.97-0.95 Continue heparin drip  Continue aspirin and statin Scheduled for cardiac cath in am by dr.Khan  lipid panel- LDL 216 ,statin  Potential candidate for Zoll LifeVest   6.   Diabetes:  A1c 9.7 sliding scale for now as she is npo after midnight  7.  Essential hypertension: Continue lisinopril and metoprolol Hold HCTZ to allow room for Lasix  8.  History of CVA: Continue aspirin and statin    All the records are reviewed and case discussed with Care Management/Social Workerr. Management plans discussed with the patient, family and they are in agreement.  CODE STATUS: fc   TOTAL TIME TAKING CARE OF THIS PATIENT: 35 minutes.   POSSIBLE D/C IN 2 DAYS, DEPENDING ON CLINICAL CONDITION.  Note: This dictation was prepared with Dragon dictation along with smaller phrase technology. Any transcriptional errors that result from this process are unintentional.   Nicholes Mango M.D on 01/30/2018 at 8:00 PM  Between 7am to 6pm - Pager - [phone] After 6pm go to [email or web address] - password [email or web address] [facility]  Office  [phone]  CC: Primary care physician; Cletis Athens, MD

## 2018-01-30 NOTE — Progress Notes (Signed)
Patient feels like she is more short of breath today.  02 saturation is WDL on 3LNC and she does not appear to be in any distress.  Will continue to monitor.

## 2018-01-30 NOTE — Consult Note (Signed)
ANTICOAGULATION CONSULT NOTE - Initial Consult  Pharmacy Consult for Heparin Indication: chest pain/ACS  Allergies  Allergen Reactions  . Bactrim [Sulfamethoxazole-Trimethoprim] Nausea And Vomiting  . Relafen [Nabumetone]     Patient Measurements: Height: 5\' 6"  (167.6 cm) Weight: 163 lb 3.2 oz (74 kg) IBW/kg (Calculated) : 59.3 Heparin Dosing Weight: 71.7kg  Vital Signs: Temp: 98.2 F (36.8 C) (12/04 1705) Temp Source: Oral (12/04 1705) BP: 124/68 (12/04 1705) Pulse Rate: 83 (12/04 1705)  Labs: Recent Labs    01/29/18 1335  01/29/18 1820 01/29/18 2337 01/30/18 0144 01/30/18 0547 01/30/18 0856 01/30/18 1658  HGB 12.2  --   --   --   --  10.9*  --   --   HCT 36.7  --   --   --   --  34.4*  --   --   PLT 306  --   --   --   --  275  --   --   APTT 30  --   --   --   --   --   --   --   LABPROT 12.2  --   --   --   --   --   --   --   INR 0.91  --   --   --   --   --   --   --   HEPARINUNFRC  --    < > 0.32  --  <0.10*  --  0.15* 0.24*  CREATININE 0.93  --   --   --   --  1.30*  --   --   TROPONINI 0.71*  --  0.98* 0.97*  --  0.95*  --   --    < > = values in this interval not displayed.    Estimated Creatinine Clearance: 48.6 mL/min (A) (by C-G formula based on SCr of 1.3 mg/dL (H)).   Medical History: Past Medical History:  Diagnosis Date  . Diabetes mellitus without complication ([place])   . Fibromyalgia   . Hypertension   . Leg cramping   . Nerve pain   . Stroke [facility])     Medications:  No anticoagulants pta  Assessment: Pharmacy has been consulted for Heparin dosing for ACS/STEMI Goal of Therapy:  Heparin level 0.3-0.7 units/ml Monitor platelets by anticoagulation protocol: Yes   Plan:  12/04 @ 1700 HL 0.24 subtherapeutic. Will rebolus w/ heparin 1050 units IV x 1 and increase rate to 1400 units/hr and will recheck HL in 6 hours. Will monitor CBC w/ am labs.  Lu Duffel, PharmD,BCPS Clinical Pharmacist 01/30/2018 7:22 PM

## 2018-01-30 NOTE — Consult Note (Addendum)
Colleen Peters is a 57 y.o. female  [id number]  Primary Cardiologist: New Pt to Dr. Neoma Laming Reason for Consultation: Elevated troponin  HPI: 57yo female with a past medical history of CHF EF 25%, HTN, diabetes type 2, stroke, and fibromyalgia presented to the ER with stomach pressure and bloating. Testing revealed her troponin was elevated to max of 0.98 consistent with NSTEMI and was admitted for treatment. LDL was noted to be 216, and A1C 9.7.    Review of Systems: Pt reports she typically sees Dr. Claudius Sis. She was diagnosed with CHF reduced EF eariler this year by Dr. Lavera Guise and was started on [facility] but could not afford it. She is feeling mildly short of breath, no chest pain, abdominal pressure has improved.    Past Medical History:  Diagnosis Date  . Diabetes mellitus without complication ([place])   . Fibromyalgia   . Hypertension   . Leg cramping   . Nerve pain   . Stroke [facility])     Medications Prior to Admission  Medication Sig Dispense Refill  . Aspirin-Caffeine (BC FAST PAIN RELIEF PO) Take 1 tablet by mouth 4 (four) times daily as needed (for pain).    Marland Kitchen atorvastatin (LIPITOR) 80 MG tablet Take 80 mg by mouth at bedtime.    . cyclobenzaprine (FLEXERIL) 10 MG tablet Take 10 mg by mouth 3 (three) times daily as needed for muscle spasms.    . diazepam (VALIUM) 5 MG tablet Take 2.5 mg by mouth every 6 (six) hours as needed for muscle spasms.     Marland Kitchen gabapentin (NEURONTIN) 600 MG tablet Take 600 mg by mouth 2 (two) times daily.    . hydrochlorothiazide (HYDRODIURIL) 25 MG tablet Take 25 mg by mouth daily.    . insulin NPH Human (HUMULIN N,NOVOLIN N) 100 UNIT/ML injection Inject 50 Units into the skin 2 (two) times daily.    Marland Kitchen lisinopril (PRINIVIL,ZESTRIL) 20 MG tablet Take 20 mg by mouth daily.    . magnesium citrate SOLN Take 296 mLs by mouth daily as needed for moderate constipation.     . metFORMIN (GLUCOPHAGE) 500 MG tablet Take 500 mg by mouth 2 (two) times daily  with a meal.    . metoprolol succinate (TOPROL-XL) 25 MG 24 hr tablet Take 25 mg by mouth daily.    . nitroGLYCERIN (NITROSTAT) 0.4 MG SL tablet Place 0.4 mg under the tongue every 5 (five) minutes as needed for chest pain.    Marland Kitchen omeprazole (PRILOSEC) 20 MG capsule Take 20 mg by mouth daily.    Marland Kitchen oxyCODONE-acetaminophen (PERCOCET) 7.5-325 MG tablet Take 0.5 tablets by mouth 4 (four) times daily.        Marland Kitchen aspirin  81 mg Oral Daily  . furosemide  20 mg Intravenous BID  . gabapentin  300 mg Oral TID  . insulin aspart  0-9 Units Subcutaneous TID WC  . lisinopril  20 mg Oral Daily  . metoprolol succinate  25 mg Oral Daily  . simvastatin  20 mg Oral Daily  . sodium chloride flush  3 mL Intravenous Q12H    Infusions: . sodium chloride    . azithromycin    . cefTRIAXone (ROCEPHIN)  IV    . heparin 1,000 Units/hr (01/30/18 0255)    Allergies  Allergen Reactions  . Bactrim [Sulfamethoxazole-Trimethoprim] Nausea And Vomiting  . Relafen [Nabumetone]     Social History   Socioeconomic History  . Marital status: Married    Spouse name: Not on  file  . Number of children: Not on file  . Years of education: Not on file  . Highest education level: Not on file  Occupational History  . Not on file  Social Needs  . Financial resource strain: Not on file  . Food insecurity:    Worry: Not on file    Inability: Not on file  . Transportation needs:    Medical: Not on file    Non-medical: Not on file  Tobacco Use  . Smoking status: Current Every Day Smoker    Packs/day: 1.00    Types: Cigarettes  . Smokeless tobacco: Never Used  Substance and Sexual Activity  . Alcohol use: No  . Drug use: No  . Sexual activity: Not on file  Lifestyle  . Physical activity:    Days per week: Not on file    Minutes per session: Not on file  . Stress: Not on file  Relationships  . Social connections:    Talks on phone: Not on file    Gets together: Not on file    Attends religious service: Not on  file    Active member of club or organization: Not on file    Attends meetings of clubs or organizations: Not on file    Relationship status: Not on file  . Intimate partner violence:    Fear of current or ex partner: Not on file    Emotionally abused: Not on file    Physically abused: Not on file    Forced sexual activity: Not on file  Other Topics Concern  . Not on file  Social History Narrative  . Not on file    History reviewed. No pertinent family history.  PHYSICAL EXAM: Vitals:   01/30/18 0449 01/30/18 0746  BP: (!) 119/54 125/66  Pulse: 82 83  Resp: 18   Temp: 98.5 F (36.9 C) 97.8 F (36.6 C)  SpO2: 93% 95%     Intake/Output Summary (Last 24 hours) at 01/30/2018 0849 Last data filed at 01/30/2018 0700 Gross per 24 hour  Intake 301.01 ml  Output 1300 ml  Net -998.99 ml    General:  Pale, mildly increased work of breath.  HEENT: normal Neck: supple. no JVD. Carotids 2+ bilat; no bruits. No lymphadenopathy or thryomegaly appreciated. Cor: PMI nondisplaced. Regular rate & rhythm. No rubs, gallops or murmurs. Lungs: clear Abdomen: soft, nontender, nondistended. No hepatosplenomegaly. No bruits or masses. Good bowel sounds. Extremities: no cyanosis, clubbing, rash, edema Neuro: alert & oriented x 3, cranial nerves grossly intact. moves all 4 extremities w/o difficulty. Affect pleasant.  ECG: Sinus tachycardia 101bpm, Atrial premature complexes, RBBB and LPFB Repolarization abnormality suggests ischemia, infero-lateral leads  Results for orders placed or performed during the hospital encounter of 01/29/18 (from the past 24 hour(s))  CBC     Status: Abnormal   Collection Time: 01/29/18  1:35 PM  Result Value Ref Range   WBC 11.1 (H) 4.0 - 10.5 K/uL   RBC 3.96 3.87 - 5.11 MIL/uL   Hemoglobin 12.2 12.0 - 15.0 g/dL   HCT 36.7 36.0 - 46.0 %   MCV 92.7 80.0 - 100.0 fL   MCH 30.8 26.0 - 34.0 pg   MCHC 33.2 30.0 - 36.0 g/dL   RDW 12.6 11.5 - 15.5 %   Platelets  306 150 - 400 K/uL   nRBC 0.0 0.0 - 0.2 %  Comprehensive metabolic panel     Status: Abnormal   Collection Time: 01/29/18  1:35 PM  Result Value Ref Range   Sodium 133 (L) 135 - 145 mmol/L   Potassium 3.7 3.5 - 5.1 mmol/L   Chloride 95 (L) 98 - 111 mmol/L   CO2 31 22 - 32 mmol/L   Glucose, Bld 243 (H) 70 - 99 mg/dL   BUN 24 (H) 6 - 20 mg/dL   Creatinine, Ser 0.93 0.44 - 1.00 mg/dL   Calcium 8.5 (L) 8.9 - 10.3 mg/dL   Total Protein 6.4 (L) 6.5 - 8.1 g/dL   Albumin 2.8 (L) 3.5 - 5.0 g/dL   AST 51 (H) 15 - 41 U/L   ALT 74 (H) 0 - 44 U/L   Alkaline Phosphatase 194 (H) 38 - 126 U/L   Total Bilirubin 0.5 0.3 - 1.2 mg/dL   GFR calc non Af Amer >60 >60 mL/min   GFR calc Af Amer >60 >60 mL/min   Anion gap 7 5 - 15  Troponin I - ONCE - STAT     Status: Abnormal   Collection Time: 01/29/18  1:35 PM  Result Value Ref Range   Troponin I 0.71 (HH) <0.03 ng/mL  APTT     Status: None   Collection Time: 01/29/18  1:35 PM  Result Value Ref Range   aPTT 30 24 - 36 seconds  Protime-INR     Status: None   Collection Time: 01/29/18  1:35 PM  Result Value Ref Range   Prothrombin Time 12.2 11.4 - 15.2 seconds   INR 0.91   Glucose, capillary     Status: Abnormal   Collection Time: 01/29/18  1:39 PM  Result Value Ref Range   Glucose-Capillary 227 (H) 70 - 99 mg/dL  Blood culture (routine x 2)     Status: None (Preliminary result)   Collection Time: 01/29/18  3:14 PM  Result Value Ref Range   Specimen Description BLOOD LEFT ANTECUBITAL    Special Requests      BOTTLES DRAWN AEROBIC AND ANAEROBIC Blood Culture adequate volume   Culture      NO GROWTH < 24 HOURS Performed at [facility], [address] [place], [place] [address]    Report Status PENDING   Blood culture (routine x 2)     Status: None (Preliminary result)   Collection Time: 01/29/18  3:15 PM  Result Value Ref Range   Specimen Description BLOOD BLOOD LEFT WRIST    Special Requests      BOTTLES DRAWN AEROBIC AND  ANAEROBIC Blood Culture results may not be optimal due to an inadequate volume of blood received in culture bottles   Culture      NO GROWTH < 24 HOURS Performed at [facility], [address], [place], [place] [address]    Report Status PENDING   Heparin level (unfractionated)     Status: None   Collection Time: 01/29/18  6:20 PM  Result Value Ref Range   Heparin Unfractionated 0.32 0.30 - 0.70 IU/mL  Hemoglobin A1c     Status: Abnormal   Collection Time: 01/29/18  6:20 PM  Result Value Ref Range   Hgb A1c MFr Bld 9.7 (H) 4.8 - 5.6 %   Mean Plasma Glucose 231.69 mg/dL  HIV antibody (Routine Testing)     Status: None   Collection Time: 01/29/18  6:20 PM  Result Value Ref Range   HIV Screen 4th Generation wRfx Non Reactive Non Reactive  Troponin I - Now Then Q6H     Status: Abnormal   Collection Time: 01/29/18  6:20 PM  Result Value Ref Range   Troponin I 0.98 (HH) <0.03 ng/mL  TSH     Status: None   Collection Time: 01/29/18  6:20 PM  Result Value Ref Range   TSH 2.745 0.350 - 4.500 uIU/mL  Lipid panel     Status: Abnormal   Collection Time: 01/29/18  6:20 PM  Result Value Ref Range   Cholesterol 286 (H) 0 - 200 mg/dL   Triglycerides 173 (H) <150 mg/dL   HDL 35 (L) >40 mg/dL   Total CHOL/HDL Ratio 8.2 RATIO   VLDL 35 0 - 40 mg/dL   LDL Cholesterol 216 (H) 0 - 99 mg/dL  Glucose, capillary     Status: Abnormal   Collection Time: 01/29/18  6:40 PM  Result Value Ref Range   Glucose-Capillary 165 (H) 70 - 99 mg/dL  Glucose, capillary     Status: Abnormal   Collection Time: 01/29/18 10:08 PM  Result Value Ref Range   Glucose-Capillary 249 (H) 70 - 99 mg/dL  Troponin I - Now Then Q6H     Status: Abnormal   Collection Time: 01/29/18 11:37 PM  Result Value Ref Range   Troponin I 0.97 (HH) <0.03 ng/mL  Heparin level (unfractionated)     Status: Abnormal   Collection Time: 01/30/18  1:44 AM  Result Value Ref Range   Heparin Unfractionated <0.10 (L) 0.30 - 0.70 IU/mL   CBC     Status: Abnormal   Collection Time: 01/30/18  5:47 AM  Result Value Ref Range   WBC 9.4 4.0 - 10.5 K/uL   RBC 3.58 (L) 3.87 - 5.11 MIL/uL   Hemoglobin 10.9 (L) 12.0 - 15.0 g/dL   HCT 34.4 (L) 36.0 - 46.0 %   MCV 96.1 80.0 - 100.0 fL   MCH 30.4 26.0 - 34.0 pg   MCHC 31.7 30.0 - 36.0 g/dL   RDW 12.6 11.5 - 15.5 %   Platelets 275 150 - 400 K/uL   nRBC 0.0 0.0 - 0.2 %  Troponin I - Now Then Q6H     Status: Abnormal   Collection Time: 01/30/18  5:47 AM  Result Value Ref Range   Troponin I 0.95 (HH) <0.03 ng/mL  Basic metabolic panel     Status: Abnormal   Collection Time: 01/30/18  5:47 AM  Result Value Ref Range   Sodium 140 135 - 145 mmol/L   Potassium 3.7 3.5 - 5.1 mmol/L   Chloride 101 98 - 111 mmol/L   CO2 33 (H) 22 - 32 mmol/L   Glucose, Bld 252 (H) 70 - 99 mg/dL   BUN 26 (H) 6 - 20 mg/dL   Creatinine, Ser 1.30 (H) 0.44 - 1.00 mg/dL   Calcium 8.3 (L) 8.9 - 10.3 mg/dL   GFR calc non Af Amer 45 (L) >60 mL/min   GFR calc Af Amer 52 (L) >60 mL/min   Anion gap 6 5 - 15  Glucose, capillary     Status: Abnormal   Collection Time: 01/30/18  7:48 AM  Result Value Ref Range   Glucose-Capillary 232 (H) 70 - 99 mg/dL   Dg Chest 2 View  Result Date: 01/29/2018 CLINICAL DATA:  Initial evaluation for acute shortness of breath, abdominal distension. EXAM: CHEST - 2 VIEW COMPARISON:  Prior radiograph from 04/12/2016. FINDINGS: Mild cardiomegaly, stable. Mediastinal silhouette within normal limits. Aortic atherosclerosis. Lungs mildly hypoinflated. Mild perihilar vascular and interstitial congestion without overt pulmonary edema. Patchy multifocal bibasilar opacities, which could reflect atelectasis and/or  infiltrates. Associated small bilateral pleural effusions. No pneumothorax. No acute osseous abnormality. IMPRESSION: 1. Patchy multifocal bibasilar opacities, which could reflect atelectasis and/or infiltrates. 2. Small bilateral pleural effusions. 3. Cardiomegaly with mild  perihilar vascular congestion without overt pulmonary edema. 4. Aortic atherosclerosis. Electronically Signed   By: Jeannine Boga M.D.   On: 01/29/2018 14:52   Dg Abdomen 1 View  Result Date: 01/29/2018 CLINICAL DATA:  Initial evaluation for acute abdominal distension. EXAM: ABDOMEN - 1 VIEW COMPARISON:  Prior CT from 11/09/2010 FINDINGS: Bowel gas pattern within normal limits without obstruction or ileus. Scattered retain enteric contrast material within the distal colon and rectum. Overall stool burden is mild. No appreciable abnormal bowel wall thickening. No free air. No soft tissue mass or abnormal calcification. Generator for sacral stimulator overlies the left lower quadrant. No acute osseous abnormality. Osteoarthritic changes noted about the hips. IMPRESSION: Nonobstructive bowel gas pattern with no radiographic evidence for acute intra-abdominal process. Electronically Signed   By: Jeannine Boga M.D.   On: 01/29/2018 14:54     ASSESSMENT AND PLAN:  NSTEMI: Elevated troponin with ischemic changes on ECG in infero-lateral leads. Echo shows diffuse hypokinesis and severe LV dysfuncntion. Will plan to do cardiac cath Thursday morning. NPO after midnight.   CHF with reduced LVEF: Echo shows LVEF 25%, dilated left atrium and Left ventricle with severe LV systolic dysfunction and diffuse hypokinesis. Mild MR/TR. Continue metoprolol, lisinopril, and lasix. Will switch to oral lasix dosing.  Would like to consider switching to [facility] in outpatient setting, however pt said she should not afford it. Potential candidate for Zoll LifeVest.   Jake Bathe, NP-C Cell: [phone]

## 2018-01-30 NOTE — Consult Note (Signed)
ANTICOAGULATION CONSULT NOTE - Initial Consult  Pharmacy Consult for Heparin Indication: chest pain/ACS  Allergies  Allergen Reactions  . Bactrim [Sulfamethoxazole-Trimethoprim] Nausea And Vomiting  . Relafen [Nabumetone]     Patient Measurements: Height: 5\' 6"  (167.6 cm) Weight: 163 lb 3.2 oz (74 kg) IBW/kg (Calculated) : 59.3 Heparin Dosing Weight: 71.7kg  Vital Signs: Temp: 97.8 F (36.6 C) (12/04 0746) Temp Source: Oral (12/04 0746) BP: 125/66 (12/04 0746) Pulse Rate: 83 (12/04 0746)  Labs: Recent Labs    01/29/18 1335 01/29/18 1820 01/29/18 2337 01/30/18 0144 01/30/18 0547 01/30/18 0856  HGB 12.2  --   --   --  10.9*  --   HCT 36.7  --   --   --  34.4*  --   PLT 306  --   --   --  275  --   APTT 30  --   --   --   --   --   LABPROT 12.2  --   --   --   --   --   INR 0.91  --   --   --   --   --   HEPARINUNFRC  --  0.32  --  <0.10*  --  0.15*  CREATININE 0.93  --   --   --  1.30*  --   TROPONINI 0.71* 0.98* 0.97*  --  0.95*  --     Estimated Creatinine Clearance: 48.6 mL/min (A) (by C-G formula based on SCr of 1.3 mg/dL (H)).   Medical History: Past Medical History:  Diagnosis Date  . Diabetes mellitus without complication ([place])   . Fibromyalgia   . Hypertension   . Leg cramping   . Nerve pain   . Stroke [facility])     Medications:  No anticoagulants pta  Assessment: Pharmacy has been consulted for Heparin dosing for ACS/STEMI Goal of Therapy:  Heparin level 0.3-0.7 units/ml Monitor platelets by anticoagulation protocol: Yes   Plan:  12/04 @ 0900 HL 0.15 subtherapeutic. Will rebolus w/ heparin 2150 units IV x 1 and increase rate to 1250 units/hr and will recheck HL in 6 hours. Will monitor CBC w/ am labs.  Ramond Dial, PharmD,BCPS Clinical Pharmacist 01/30/2018 10:45 AM

## 2018-01-30 NOTE — Consult Note (Signed)
ANTICOAGULATION CONSULT NOTE - Initial Consult  Pharmacy Consult for Heparin Indication: chest pain/ACS  Allergies  Allergen Reactions  . Bactrim [Sulfamethoxazole-Trimethoprim] Nausea And Vomiting  . Relafen [Nabumetone]     Patient Measurements: Height: 5\' 6"  (167.6 cm) Weight: 158 lb (71.7 kg) IBW/kg (Calculated) : 59.3 Heparin Dosing Weight: 71.7kg  Vital Signs: Temp: 98.5 F (36.9 C) (12/03 2113) Temp Source: Oral (12/03 2113) BP: 125/64 (12/03 2113) Pulse Rate: 86 (12/03 2113)  Labs: Recent Labs    01/29/18 1335 01/29/18 1820 01/29/18 2337 01/30/18 0144  HGB 12.2  --   --   --   HCT 36.7  --   --   --   PLT 306  --   --   --   APTT 30  --   --   --   LABPROT 12.2  --   --   --   INR 0.91  --   --   --   HEPARINUNFRC  --  0.32  --  <0.10*  CREATININE 0.93  --   --   --   TROPONINI 0.71* 0.98* 0.97*  --     Estimated Creatinine Clearance: 66.9 mL/min (by C-G formula based on SCr of 0.93 mg/dL).   Medical History: Past Medical History:  Diagnosis Date  . Diabetes mellitus without complication ([place])   . Fibromyalgia   . Hypertension   . Leg cramping   . Nerve pain   . Stroke [facility])     Medications:  No anticoagulants pta  Assessment: Pharmacy has been consulted for Heparin dosing for ACS/STEMI Goal of Therapy:  Heparin level 0.3-0.7 units/ml Monitor platelets by anticoagulation protocol: Yes   Plan:  12/04 @ 0200 HL < 0.10 subtherapeutic. Will rebolus w/ heparin 2100 units IV x 1 and increase rate to 1000 units/hr and will recheck HL @ 0900. Will monitor CBC w/ am labs.  Tobie Lords, PharmD Clinical Pharmacist 01/30/2018 2:51 AM

## 2018-01-30 NOTE — Care Management Note (Signed)
Case Management Note  Patient Details  Name: BARBARITA HUTMACHER MRN: [id number] Date of Birth: 30-Jun-1960  Subjective/Objective:                 Presents to ED with shortness of breath. Current need for oxygen is acute. EF is 25%. There was discussion as outpatient of putting her on Entresto but reported she could not afford it..Attending says that patient's symptoms are mostly from cardiac issues and not pneumonia. Cardiac cath pending.  There is discussion of zoll vest   Action/Plan:  Will need to assess whether she has part d coverage with her medicare .  Would she qualify for [facility] Patient Assistance from drug company.  Needs home 02 assessment prior to discharge  Expected Discharge Date:                  Expected Discharge Plan:     In-House Referral:     Discharge planning Services     Post Acute Care Choice:    Choice offered to:     DME Arranged:    DME Agency:     HH Arranged:    HH Agency:     Status of Service:     If discussed at [employer], dates discussed:    Additional Comments:  Katrina Stack, RN 01/30/2018, 6:14 PM

## 2018-01-30 NOTE — Progress Notes (Addendum)
Inpatient Diabetes Program Recommendations  AACE/ADA: New Consensus Statement on Inpatient Glycemic Control (2019)  Target Ranges:  Prepandial:   less than 140 mg/dL      Peak postprandial:   less than 180 mg/dL (1-2 hours)      Critically ill patients:  140 - 180 mg/dL   Results for BRIGGETTE, NAJARIAN (MRN [id number]) as of 01/30/2018 14:20  Ref. Range 01/29/2018 13:39 01/29/2018 18:40 01/29/2018 22:08 01/30/2018 07:48 01/30/2018 11:31  Glucose-Capillary Latest Ref Range: 70 - 99 mg/dL 227 (H) 165 (H) 249 (H) 232 (H)  Novolog 3 units 130 (H)  Results for RAELIE, LOHR (MRN [id number]) as of 01/30/2018 14:20  Ref. Range 01/29/2018 18:20  Hemoglobin A1C Latest Ref Range: 4.8 - 5.6 % 9.7 (H)   Review of Glycemic Control  Diabetes history: DM2 Outpatient Diabetes medications: 70/30 0-50 units BID (based on glucose and if she eats), Metformin 500 mg BID Current orders for Inpatient glycemic control: Novolog 0-9 units TID with meals  Inpatient Diabetes Program Recommendations:  HbgA1C:  A1C 9.7% on 01/29/18 indicating an average glucose of 232 mg/dl over the past 2-3 months.  NOTE: Spoke with patient about diabetes and home regimen for diabetes control. Patient reports that she is followed by PCP for diabetes management and she does not have an Endocrinologist and is not interested in seeing one due to cost.  Patient reports that she is taking 70/30 0-50 units BID (based on glucose and if she eats) and Metformin 500 mg BID for DM control.   Patient states that her glucose ranges from mid 100's - 200's mg/dl usually. Patient states that if her glucose is less than 250 mg/dl and she is going to eat she takes 70/30 25 units; if glucose is greater than 250 mg/dl and she is going to eat, she takes 70/30 50 units.  Patient reports that she if glucose is in 100's she does not take any 70/30 and if she is not going to eat then she does not feel comfortable taking any insulin. Patient also notes that if her  glucose is running less than 200, she does not take the Metformin either due to fear of hypoglycemia. Discussed 70/30 and Metformin in more detail and explained how they work.  Patient states that she gets symptoms of hypoglycemia if her glucose is in the 100's mg/dl and she does not like her glucose to be that low. Patient states that she prefers her glucose to be in the 200's mg/dl.  Explained that her body is use to her glucose being high so when it is in a normal range, she likely feels symptoms of hypoglycemia. Explained to the patient that if she can get DM better controlled then her body will reset itself to tolerate normal glucose targets. Discussed A1C results (9.7% on 01/29/18) and explained that her current A1C indicates an average glucose of 232 mg/dl over the past 2-3 months. Patient states that is fairly good for her.  Discussed glucose and A1C goals. Discussed importance of checking CBGs and maintaining good CBG control to prevent long-term and short-term complications. Explained how hyperglycemia leads to damage within blood vessels which lead to the common complications seen with uncontrolled diabetes. Stressed to the patient the importance of improving glycemic control to prevent further complications from uncontrolled diabetes. Encouraged patient to check her glucose 4 times per day (before meals and at bedtime) and to keep a detailed log book of glucose readings and exactly how much insulin taken (if none  then record none) which she will need to take to doctor appointments. Explained how the doctor can use the log book to continue to make insulin adjustments if needed. Patient verbalized understanding of information discussed and she states that she has no further questions at this time related to diabetes.  Thanks, Barnie Alderman, RN, MSN, CDE Diabetes Coordinator Inpatient Diabetes Program [phone] (Team Pager)

## 2018-01-31 ENCOUNTER — Encounter
Admission: EM | Disposition: A | Discharge status: Home / Self Care | Payer: Self-pay | Source: Home / Self Care | Attending: Internal Medicine

## 2018-01-31 ENCOUNTER — Encounter: Payer: Self-pay | Admitting: Certified Registered Nurse Anesthetist

## 2018-01-31 HISTORY — PX: LEFT HEART CATH: CATH118248

## 2018-01-31 HISTORY — PX: CORONARY STENT INTERVENTION: CATH118234

## 2018-01-31 HISTORY — PX: CORONARY ANGIOGRAPHY: CATH118303

## 2018-01-31 LAB — CBC
HCT: 33.8 % — ABNORMAL LOW (ref 36.0–46.0)
Hemoglobin: 10.8 g/dL — ABNORMAL LOW (ref 12.0–15.0)
MCH: 30.9 pg (ref 26.0–34.0)
MCHC: 32 g/dL (ref 30.0–36.0)
MCV: 96.6 fL (ref 80.0–100.0)
NRBC: 0 % (ref 0.0–0.2)
PLATELETS: 263 10*3/uL (ref 150–400)
RBC: 3.5 MIL/uL — ABNORMAL LOW (ref 3.87–5.11)
RDW: 12.8 % (ref 11.5–15.5)
WBC: 9.3 10*3/uL (ref 4.0–10.5)

## 2018-01-31 LAB — GLUCOSE, CAPILLARY
GLUCOSE-CAPILLARY: 235 mg/dL — AB (ref 70–99)
GLUCOSE-CAPILLARY: 311 mg/dL — AB (ref 70–99)
Glucose-Capillary: 151 mg/dL — ABNORMAL HIGH (ref 70–99)
Glucose-Capillary: 211 mg/dL — ABNORMAL HIGH (ref 70–99)

## 2018-01-31 LAB — HEPARIN LEVEL (UNFRACTIONATED)
Heparin Unfractionated: 0.37 IU/mL (ref 0.30–0.70)
Heparin Unfractionated: 0.38 IU/mL (ref 0.30–0.70)

## 2018-01-31 LAB — POCT ACTIVATED CLOTTING TIME: Activated Clotting Time: 241 seconds

## 2018-01-31 SURGERY — LEFT HEART CATH
Anesthesia: Moderate Sedation | Laterality: Right

## 2018-01-31 MED ORDER — SODIUM CHLORIDE 0.9% FLUSH: 3.0000 mL | INTRAVENOUS | Status: DC | PRN

## 2018-01-31 MED ORDER — LABETALOL HCL 5 MG/ML IV SOLN: 10.0000 mg | INTRAVENOUS | Status: AC | PRN

## 2018-01-31 MED ORDER — SACUBITRIL-VALSARTAN 24-26 MG PO TABS
1.0000 | ORAL_TABLET | Freq: Two times a day (BID) | ORAL | Status: DC
  Administered 2018-01-31 – 2018-02-01 (×2): 1 via ORAL
  Filled 2018-01-31 (×2): qty 1

## 2018-01-31 MED ORDER — HEPARIN (PORCINE) IN NACL 1000-0.9 UT/500ML-% IV SOLN
INTRAVENOUS | Status: AC
  Filled 2018-01-31: qty 1000

## 2018-01-31 MED ORDER — MIDAZOLAM HCL 2 MG/2ML IJ SOLN
INTRAMUSCULAR | Status: AC
  Filled 2018-01-31: qty 2

## 2018-01-31 MED ORDER — NITROGLYCERIN 1 MG/10 ML FOR IR/CATH LAB
INTRA_ARTERIAL | Status: DC | PRN
  Administered 2018-01-31 (×2): 200 ug via INTRACORONARY

## 2018-01-31 MED ORDER — SODIUM CHLORIDE 0.9 % IV SOLN
INTRAVENOUS | Status: AC | PRN
  Administered 2018-01-31: 1.75 mg/kg/h via INTRAVENOUS

## 2018-01-31 MED ORDER — SODIUM CHLORIDE 0.9 % WEIGHT BASED INFUSION: 1.0000 mL/kg/h | INTRAVENOUS | Status: AC

## 2018-01-31 MED ORDER — TICAGRELOR 90 MG PO TABS
ORAL_TABLET | ORAL | Status: AC
  Filled 2018-01-31: qty 2

## 2018-01-31 MED ORDER — BIVALIRUDIN BOLUS VIA INFUSION - CUPID
INTRAVENOUS | Status: DC | PRN
  Administered 2018-01-31: 55.875 mg via INTRAVENOUS

## 2018-01-31 MED ORDER — ASPIRIN 81 MG PO CHEW: 81.0000 mg | CHEWABLE_TABLET | Freq: Every day | ORAL | Status: DC

## 2018-01-31 MED ORDER — SODIUM CHLORIDE 0.9 % IV SOLN: 250.0000 mL | INTRAVENOUS | Status: DC | PRN

## 2018-01-31 MED ORDER — NITROGLYCERIN 5 MG/ML IV SOLN
INTRAVENOUS | Status: AC
  Filled 2018-01-31: qty 10

## 2018-01-31 MED ORDER — ACETAMINOPHEN 325 MG PO TABS: 650.0000 mg | ORAL_TABLET | ORAL | Status: DC | PRN

## 2018-01-31 MED ORDER — INSULIN GLARGINE 100 UNIT/ML ~~LOC~~ SOLN
5.0000 [IU] | Freq: Every day | SUBCUTANEOUS | Status: DC
  Administered 2018-01-31 – 2018-02-01 (×2): 5 [IU] via SUBCUTANEOUS
  Filled 2018-01-31 (×3): qty 0.05

## 2018-01-31 MED ORDER — CARVEDILOL 6.25 MG PO TABS
6.2500 mg | ORAL_TABLET | Freq: Two times a day (BID) | ORAL | Status: DC
  Administered 2018-01-31 – 2018-02-01 (×3): 6.25 mg via ORAL
  Filled 2018-01-31 (×3): qty 1

## 2018-01-31 MED ORDER — SODIUM CHLORIDE 0.9% FLUSH
3.0000 mL | Freq: Two times a day (BID) | INTRAVENOUS | Status: DC
  Administered 2018-01-31: 3 mL via INTRAVENOUS

## 2018-01-31 MED ORDER — BIVALIRUDIN TRIFLUOROACETATE 250 MG IV SOLR
INTRAVENOUS | Status: AC
  Filled 2018-01-31: qty 250

## 2018-01-31 MED ORDER — ONDANSETRON HCL 4 MG/2ML IJ SOLN: 4.0000 mg | Freq: Four times a day (QID) | INTRAMUSCULAR | Status: DC | PRN

## 2018-01-31 MED ORDER — SODIUM CHLORIDE 0.9% FLUSH
3.0000 mL | Freq: Two times a day (BID) | INTRAVENOUS | Status: DC
  Administered 2018-02-01: 3 mL via INTRAVENOUS

## 2018-01-31 MED ORDER — SODIUM CHLORIDE 0.9 % IV SOLN
0.2500 mg/kg/h | INTRAVENOUS | Status: AC
  Filled 2018-01-31: qty 250

## 2018-01-31 MED ORDER — FENTANYL CITRATE (PF) 100 MCG/2ML IJ SOLN
INTRAMUSCULAR | Status: AC
  Filled 2018-01-31: qty 2

## 2018-01-31 MED ORDER — HYDRALAZINE HCL 20 MG/ML IJ SOLN: 5.0000 mg | INTRAMUSCULAR | Status: AC | PRN

## 2018-01-31 MED ORDER — CLOPIDOGREL BISULFATE 75 MG PO TABS
ORAL_TABLET | ORAL | Status: DC | PRN
  Administered 2018-01-31: 600 mg via ORAL

## 2018-01-31 MED ORDER — CLOPIDOGREL BISULFATE 75 MG PO TABS
75.0000 mg | ORAL_TABLET | Freq: Every day | ORAL | Status: DC
  Administered 2018-02-01: 75 mg via ORAL
  Filled 2018-01-31: qty 1

## 2018-01-31 MED ORDER — FENTANYL CITRATE (PF) 100 MCG/2ML IJ SOLN
INTRAMUSCULAR | Status: DC | PRN
  Administered 2018-01-31: 50 ug via INTRAVENOUS

## 2018-01-31 MED ORDER — CLOPIDOGREL BISULFATE 300 MG PO TABS
ORAL_TABLET | ORAL | Status: AC
  Filled 2018-01-31: qty 2

## 2018-01-31 MED ORDER — IOPAMIDOL (ISOVUE-300) INJECTION 61%
INTRAVENOUS | Status: DC | PRN
  Administered 2018-01-31: 100 mL via INTRA_ARTERIAL
  Administered 2018-01-31: 140 mL via INTRA_ARTERIAL

## 2018-01-31 MED ORDER — MIDAZOLAM HCL 2 MG/2ML IJ SOLN
INTRAMUSCULAR | Status: DC | PRN
  Administered 2018-01-31 (×2): 1 mg via INTRAVENOUS

## 2018-01-31 SURGICAL SUPPLY — 19 items
BALLN TREK RX 2.5X20 (BALLOONS) ×3
BALLOON TREK RX 2.5X20 (BALLOONS) ×2 IMPLANT
CATH INFINITI 5FR ANG PIGTAIL (CATHETERS) ×3 IMPLANT
CATH INFINITI 5FR JL4 (CATHETERS) ×3 IMPLANT
CATH INFINITI JR4 5F (CATHETERS) ×3 IMPLANT
CATH VISTA GUIDE 6FR JR4 SH (CATHETERS) ×3 IMPLANT
DEVICE CLOSURE MYNXGRIP 6/7F (Vascular Products) ×3 IMPLANT
DEVICE INFLAT 30 PLUS (MISCELLANEOUS) ×3 IMPLANT
KIT MANI 3VAL PERCEP (MISCELLANEOUS) ×3 IMPLANT
NEEDLE PERC 18GX7CM (NEEDLE) ×3 IMPLANT
PACK CARDIAC CATH (CUSTOM PROCEDURE TRAY) ×3 IMPLANT
SHEATH AVANTI 5FR X 11CM (SHEATH) ×3 IMPLANT
SHEATH AVANTI 6FR X 11CM (SHEATH) ×3 IMPLANT
STENT RESOLUTE ONYX 3.0X12 (Permanent Stent) ×3 IMPLANT
STENT RESOLUTE ONYX3.0X38 (Permanent Stent) ×3 IMPLANT
WIRE ASAHI PROWATER 180CM (WIRE) ×3 IMPLANT
WIRE EMERALD 3MM-J .035X260CM (WIRE) IMPLANT
WIRE GUIDERIGHT .035X150 (WIRE) ×6 IMPLANT
WIRE HITORQ VERSACORE ST 145CM (WIRE) ×3 IMPLANT

## 2018-01-31 NOTE — Progress Notes (Signed)
Patient has no acute event overnight, she has heparin infusion overnight and kept NPO overnight for possible heart catheterization in the morning.

## 2018-01-31 NOTE — Consult Note (Signed)
ANTICOAGULATION CONSULT NOTE - Initial Consult  Pharmacy Consult for Heparin Indication: chest pain/ACS  Allergies  Allergen Reactions  . Bactrim [Sulfamethoxazole-Trimethoprim] Nausea And Vomiting  . Relafen [Nabumetone]     Patient Measurements: Height: 5\' 6"  (167.6 cm) Weight: 164 lb 3.2 oz (74.5 kg) IBW/kg (Calculated) : 59.3 Heparin Dosing Weight: 71.7kg  Vital Signs: Temp: 98.2 F (36.8 C) (12/05 0736) Temp Source: Oral (12/05 0736) BP: 108/96 (12/05 0736) Pulse Rate: 82 (12/05 0736)  Labs: Recent Labs    01/29/18 1335 01/29/18 1820 01/29/18 2337  01/30/18 0547  01/30/18 1658 01/31/18 0203 01/31/18 0801  HGB 12.2  --   --   --  10.9*  --   --  10.8*  --   HCT 36.7  --   --   --  34.4*  --   --  33.8*  --   PLT 306  --   --   --  275  --   --  263  --   APTT 30  --   --   --   --   --   --   --   --   LABPROT 12.2  --   --   --   --   --   --   --   --   INR 0.91  --   --   --   --   --   --   --   --   HEPARINUNFRC  --  0.32  --    < >  --    < > 0.24* 0.37 0.38  CREATININE 0.93  --   --   --  1.30*  --   --   --   --   TROPONINI 0.71* 0.98* 0.97*  --  0.95*  --   --   --   --    < > = values in this interval not displayed.    Estimated Creatinine Clearance: 48.7 mL/min (A) (by C-G formula based on SCr of 1.3 mg/dL (H)).   Medical History: Past Medical History:  Diagnosis Date  . Diabetes mellitus without complication ([place])   . Fibromyalgia   . Hypertension   . Leg cramping   . Nerve pain   . Stroke [facility])     Medications:  No anticoagulants pta  Assessment: Pharmacy has been consulted for Heparin dosing for ACS/STEMI Goal of Therapy:  Heparin level 0.3-0.7 units/ml Monitor platelets by anticoagulation protocol: Yes   Plan:  12/05 @ 0800 HL 0.38 therapeutic. Will continue current rate and will recheck HL daily. Will monitor CBC w/ am labs.  Forrest Moron, PharmD Clinical Pharmacist 01/31/2018 8:48 AM

## 2018-01-31 NOTE — Progress Notes (Signed)
[place] [facility] [place] NAME: Colleen Peters    MR#:  [id number]  DATE OF BIRTH:  [birth year]  SUBJECTIVE:   Patient seen in specials recovery.  Patient status post stent placement denies chest pain or shortness of breath  REVIEW OF SYSTEMS:    Review of Systems  Constitutional: Negative for fever, chills weight loss HENT: Negative for ear pain, nosebleeds, congestion, facial swelling, rhinorrhea, neck pain, neck stiffness and ear discharge.   Respiratory: Negative for cough, shortness of breath, wheezing  Cardiovascular: Negative for chest pain, palpitations and leg swelling.  Gastrointestinal: Negative for heartburn, abdominal pain, vomiting, diarrhea or consitpation Genitourinary: Negative for dysuria, urgency, frequency, hematuria Musculoskeletal: Negative for back pain or joint pain Neurological: Negative for dizziness, seizures, syncope, focal weakness,  numbness and headaches.  Hematological: Does not bruise/bleed easily.  Psychiatric/Behavioral: Negative for hallucinations, confusion, dysphoric mood    Tolerating Diet: yes      DRUG ALLERGIES:   Allergies  Allergen Reactions  . Bactrim [Sulfamethoxazole-Trimethoprim] Nausea And Vomiting  . Relafen [Nabumetone]     VITALS:  Blood pressure 130/75, pulse 91, temperature 98.2 F (36.8 C), temperature source Oral, resp. rate 16, height 5\' 6"  (1.676 m), weight 74.5 kg, SpO2 95 %.  PHYSICAL EXAMINATION:  Constitutional: Appears well-developed and well-nourished. No distress. HENT: Normocephalic. Marland Kitchen Oropharynx is clear and moist.  Eyes: Conjunctivae and EOM are normal. PERRLA, no scleral icterus.  Neck: Normal ROM. Neck supple. No JVD. No tracheal deviation. CVS: RRR, S1/S2 +, no murmurs, no gallops, no carotid bruit.  Pulmonary: Effort and breath sounds normal, no stridor, rhonchi, wheezes, rales.  Abdominal: Soft. BS +,  no distension, tenderness, rebound or guarding.   Musculoskeletal: Normal range of motion. No edema and no tenderness.  Neuro: Alert. CN 2-12 grossly intact. No focal deficits. Skin: Skin is warm and dry. No rash noted. Psychiatric: Normal mood and affect.      LABORATORY PANEL:   CBC Recent Labs  Lab 01/31/18 0203  WBC 9.3  HGB 10.8*  HCT 33.8*  PLT 263   ------------------------------------------------------------------------------------------------------------------  Chemistries  Recent Labs  Lab 01/29/18 1335 01/30/18 0547  NA 133* 140  K 3.7 3.7  CL 95* 101  CO2 31 33*  GLUCOSE 243* 252*  BUN 24* 26*  CREATININE 0.93 1.30*  CALCIUM 8.5* 8.3*  AST 51*  --   ALT 74*  --   ALKPHOS 194*  --   BILITOT 0.5  --    ------------------------------------------------------------------------------------------------------------------  Cardiac Enzymes Recent Labs  Lab 01/29/18 1820 01/29/18 2337 01/30/18 0547  TROPONINI 0.98* 0.97* 0.95*   ------------------------------------------------------------------------------------------------------------------  RADIOLOGY:  Dg Chest 2 View  Result Date: 01/29/2018 CLINICAL DATA:  Initial evaluation for acute shortness of breath, abdominal distension. EXAM: CHEST - 2 VIEW COMPARISON:  Prior radiograph from 04/12/2016. FINDINGS: Mild cardiomegaly, stable. Mediastinal silhouette within normal limits. Aortic atherosclerosis. Lungs mildly hypoinflated. Mild perihilar vascular and interstitial congestion without overt pulmonary edema. Patchy multifocal bibasilar opacities, which could reflect atelectasis and/or infiltrates. Associated small bilateral pleural effusions. No pneumothorax. No acute osseous abnormality. IMPRESSION: 1. Patchy multifocal bibasilar opacities, which could reflect atelectasis and/or infiltrates. 2. Small bilateral pleural effusions. 3. Cardiomegaly with mild perihilar vascular congestion without overt pulmonary edema. 4. Aortic atherosclerosis. Electronically  Signed   By: Jeannine Boga M.D.   On: 01/29/2018 14:52   Dg Abdomen 1 View  Result Date: 01/29/2018 CLINICAL DATA:  Initial evaluation for acute abdominal distension. EXAM: ABDOMEN - 1  VIEW COMPARISON:  Prior CT from 11/09/2010 FINDINGS: Bowel gas pattern within normal limits without obstruction or ileus. Scattered retain enteric contrast material within the distal colon and rectum. Overall stool burden is mild. No appreciable abnormal bowel wall thickening. No free air. No soft tissue mass or abnormal calcification. Generator for sacral stimulator overlies the left lower quadrant. No acute osseous abnormality. Osteoarthritic changes noted about the hips. IMPRESSION: Nonobstructive bowel gas pattern with no radiographic evidence for acute intra-abdominal process. Electronically Signed   By: Jeannine Boga M.D.   On: 01/29/2018 14:54     ASSESSMENT AND PLAN:   57 year old female with a history of diabetes and chronic systolic heart failure presented emergency room due to shortness of breath.   1.  Acute hypoxic respiratory failure in the setting of community-acquired pneumonia and acute on chronic systolic heart failure Wean oxygen as tolerated  2.  Non-STEMI: Patient status post cardiac catheterization today which shows  Mid RCA lesion is 95% stenosed.  A drug-eluting stent was successfully placed using a STENT RESOLUTE [id number].  Post intervention, there is a 0% residual stenosis.  Prox RCA to Mid RCA lesion is 70% stenosed.  A drug-eluting stent was successfully placed using a STENT RESOLUTE [id number] 3.0X12.  Post intervention, there is a 0% residual stenosis.   1.  High-grade 95% stenosis mid RCA 2.  Successful PCI with overlapping 3.0 x 38 mm and 3.0 x 12 mm Resolute Onyx stents   Continue aspirin, statin, Coreg, Plavix 3.  Community-acquired pneumonia: Continue Rocephin and azithromycin  4.  Acute on chronic systolic heart failure with four-chamber dilation and  severe LV dysfunction of 25%: Continue Entresto as recommended by cardiology  5.  Uncontrolled diabetes 80 CA 9.7: Continue current care and ADA diet with sliding scale Diabetes nurse consultation appreciated Patient will need close outpatient follow-up.   Management plans discussed with the patient and she is in agreement.  CODE STATUS: full  TOTAL TIME TAKING CARE OF THIS PATIENT: 28 minutes.     POSSIBLE D/C tomorrow, DEPENDING ON CLINICAL CONDITION.   Marquita Lias M.D on 01/31/2018 at 1:14 PM  Between 7am to 6pm - Pager - [phone] After 6pm go to [email or web address] - password [email or web address] [facility]  Office  [phone]  CC: Primary care physician; Cletis Athens, MD  Note: This dictation was prepared with Dragon dictation along with smaller phrase technology. Any transcriptional errors that result from this process are unintentional.

## 2018-01-31 NOTE — Progress Notes (Signed)
SUBJECTIVE: Patient short of breath   Vitals:   01/30/18 1705 01/30/18 1947 01/31/18 0623 01/31/18 0736  BP: 124/68 129/63 (!) 123/56 (!) 108/96  Pulse: 83 84 81 82  Resp:  18  20  Temp: 98.2 F (36.8 C) 97.8 F (36.6 C) 98.3 F (36.8 C) 98.2 F (36.8 C)  TempSrc: Oral Oral Oral Oral  SpO2: 97% 98% 95% 97%  Weight:   74.5 kg   Height:        Intake/Output Summary (Last 24 hours) at 01/31/2018 0905 Last data filed at 01/31/2018 4097 Gross per 24 hour  Intake 909.14 ml  Output 2800 ml  Net -1890.86 ml    LABS: Basic Metabolic Panel: Recent Labs    01/29/18 1335 01/30/18 0547  NA 133* 140  K 3.7 3.7  CL 95* 101  CO2 31 33*  GLUCOSE 243* 252*  BUN 24* 26*  CREATININE 0.93 1.30*  CALCIUM 8.5* 8.3*   Liver Function Tests: Recent Labs    01/29/18 1335  AST 51*  ALT 74*  ALKPHOS 194*  BILITOT 0.5  PROT 6.4*  ALBUMIN 2.8*   No results for input(s): LIPASE, AMYLASE in the last 72 hours. CBC: Recent Labs    01/30/18 0547 01/31/18 0203  WBC 9.4 9.3  HGB 10.9* 10.8*  HCT 34.4* 33.8*  MCV 96.1 96.6  PLT 275 263   Cardiac Enzymes: Recent Labs    01/29/18 1820 01/29/18 2337 01/30/18 0547  TROPONINI 0.98* 0.97* 0.95*   BNP: Invalid input(s): POCBNP D-Dimer: No results for input(s): DDIMER in the last 72 hours. Hemoglobin A1C: Recent Labs    01/29/18 1820  HGBA1C 9.7*   Fasting Lipid Panel: Recent Labs    01/29/18 1820  CHOL 286*  HDL 35*  LDLCALC 216*  TRIG 173*  CHOLHDL 8.2   Thyroid Function Tests: Recent Labs    01/29/18 1820  TSH 2.745   Anemia Panel: No results for input(s): VITAMINB12, FOLATE, FERRITIN, TIBC, IRON, RETICCTPCT in the last 72 hours.   PHYSICAL EXAM General: Well developed, well nourished, in no acute distress HEENT:  Normocephalic and atramatic Neck:  No JVD.  Lungs: Clear bilaterally to auscultation and percussion. Heart: HRRR . Normal S1 and S2 without gallops or murmurs.  Abdomen: Bowel sounds are  positive, abdomen soft and non-tender  Msk:  Back normal, normal gait. Normal strength and tone for age. Extremities: No clubbing, cyanosis or edema.   Neuro: Alert and oriented X 3. Psych:  Good affect, responds appropriately  TELEMETRY: Sinus rhythm  ASSESSMENT AND PLAN: Non-STEMI with four-chamber dilatation and severe LV dysfunction.  Will do left heart catheterization today.  To arrange for [facility] as patient cannot afford it.  Active Problems:   PNA (pneumonia)    Dionisio David, MD, [facility] 01/31/2018 9:05 AM

## 2018-01-31 NOTE — Plan of Care (Signed)
  Problem: Education: Goal: Knowledge of General Education information will improve Description Including pain rating scale, medication(s)/side effects and non-pharmacologic comfort measures Outcome: Progressing   Problem: Health Behavior/Discharge Planning: Goal: Ability to manage health-related needs will improve Outcome: Progressing   Problem: Clinical Measurements: Goal: Ability to maintain clinical measurements within normal limits will improve Outcome: Progressing Goal: Will remain free from infection Outcome: Progressing Goal: Diagnostic test results will improve Outcome: Progressing Goal: Respiratory complications will improve Outcome: Progressing Goal: Cardiovascular complication will be avoided Outcome: Progressing   Problem: Activity: Goal: Risk for activity intolerance will decrease Outcome: Progressing   Problem: Nutrition: Goal: Adequate nutrition will be maintained Outcome: Progressing   Problem: Coping: Goal: Level of anxiety will decrease Outcome: Progressing   Problem: Elimination: Goal: Will not experience complications related to bowel motility Outcome: Progressing Goal: Will not experience complications related to urinary retention Outcome: Progressing   Problem: Pain Managment: Goal: General experience of comfort will improve Outcome: Progressing   Problem: Safety: Goal: Ability to remain free from injury will improve Outcome: Progressing   Problem: Skin Integrity: Goal: Risk for impaired skin integrity will decrease Outcome: Progressing   Problem: Activity: Goal: Ability to tolerate increased activity will improve Outcome: Progressing   Problem: Clinical Measurements: Goal: Ability to maintain a body temperature in the normal range will improve Outcome: Progressing   Problem: Respiratory: Goal: Ability to maintain adequate ventilation will improve Outcome: Progressing Goal: Ability to maintain a clear airway will improve Outcome:  Progressing   Problem: Education: Goal: Understanding of cardiac disease, CV risk reduction, and recovery process will improve Outcome: Progressing   Problem: Activity: Goal: Ability to tolerate increased activity will improve Outcome: Progressing   Problem: Cardiac: Goal: Ability to achieve and maintain adequate cardiovascular perfusion will improve Outcome: Progressing   Problem: Health Behavior/Discharge Planning: Goal: Ability to safely manage health-related needs after discharge will improve Outcome: Progressing

## 2018-01-31 NOTE — Progress Notes (Signed)
Inpatient Diabetes Program Recommendations  AACE/ADA: New Consensus Statement on Inpatient Glycemic Control (2019)  Target Ranges:  Prepandial:   less than 140 mg/dL      Peak postprandial:   less than 180 mg/dL (1-2 hours)      Critically ill patients:  140 - 180 mg/dL   Results for Colleen Peters, Colleen Peters (MRN [id number]) as of 01/31/2018 08:36  Ref. Range 01/30/2018 07:48 01/30/2018 11:31 01/30/2018 17:04 01/30/2018 21:19 01/31/2018 07:37  Glucose-Capillary Latest Ref Range: 70 - 99 mg/dL 232 (H) 130 (H) 222 (H) 188 (H) 235 (H)   Results for LUZ, MARES (MRN [id number]) as of 01/31/2018 08:36  Ref. Range 01/29/2018 18:20  Hemoglobin A1C Latest Ref Range: 4.8 - 5.6 % 9.7 (H)   Review of Glycemic Control  Diabetes history: DM2 Outpatient Diabetes medications: 70/30 0-50 units BID (based on glucose and if she eats), Metformin 500 mg BID Current orders for Inpatient glycemic control: Novolog 0-9 units TID with meals  Inpatient Diabetes Program Recommendations:  Insulin-Basal: Please consider ordering Lantus 5 units daily. HbgA1C:  A1C 9.7% on 01/29/18 indicating an average glucose of 232 mg/dl over the past 2-3 months.  Thanks, Barnie Alderman, RN, MSN, CDE Diabetes Coordinator Inpatient Diabetes Program [phone] (Team Pager from 8am to 5pm)

## 2018-01-31 NOTE — Progress Notes (Signed)
SUBJECTIVE: Patient is still very short of breath   Vitals:   01/30/18 1947 01/31/18 0623 01/31/18 0736 01/31/18 0912  BP: 129/63 (!) 123/56 (!) 108/96 130/82  Pulse: 84 81 82 87  Resp: 18  20 20   Temp: 97.8 F (36.6 C) 98.3 F (36.8 C) 98.2 F (36.8 C)   TempSrc: Oral Oral Oral   SpO2: 98% 95% 97% 97%  Weight:  74.5 kg    Height:        Intake/Output Summary (Last 24 hours) at 01/31/2018 0938 Last data filed at 01/31/2018 0904 Gross per 24 hour  Intake 909.14 ml  Output 2800 ml  Net -1890.86 ml    LABS: Basic Metabolic Panel: Recent Labs    01/29/18 1335 01/30/18 0547  NA 133* 140  K 3.7 3.7  CL 95* 101  CO2 31 33*  GLUCOSE 243* 252*  BUN 24* 26*  CREATININE 0.93 1.30*  CALCIUM 8.5* 8.3*   Liver Function Tests: Recent Labs    01/29/18 1335  AST 51*  ALT 74*  ALKPHOS 194*  BILITOT 0.5  PROT 6.4*  ALBUMIN 2.8*   No results for input(s): LIPASE, AMYLASE in the last 72 hours. CBC: Recent Labs    01/30/18 0547 01/31/18 0203  WBC 9.4 9.3  HGB 10.9* 10.8*  HCT 34.4* 33.8*  MCV 96.1 96.6  PLT 275 263   Cardiac Enzymes: Recent Labs    01/29/18 1820 01/29/18 2337 01/30/18 0547  TROPONINI 0.98* 0.97* 0.95*   BNP: Invalid input(s): POCBNP D-Dimer: No results for input(s): DDIMER in the last 72 hours. Hemoglobin A1C: Recent Labs    01/29/18 1820  HGBA1C 9.7*   Fasting Lipid Panel: Recent Labs    01/29/18 1820  CHOL 286*  HDL 35*  LDLCALC 216*  TRIG 173*  CHOLHDL 8.2   Thyroid Function Tests: Recent Labs    01/29/18 1820  TSH 2.745   Anemia Panel: No results for input(s): VITAMINB12, FOLATE, FERRITIN, TIBC, IRON, RETICCTPCT in the last 72 hours.   PHYSICAL EXAM General: Well developed, well nourished, in no acute distress HEENT:  Normocephalic and atramatic Neck:  No JVD.  Lungs: Clear bilaterally to auscultation and percussion. Heart: HRRR . Normal S1 and S2 without gallops or murmurs.  Abdomen: Bowel sounds are positive,  abdomen soft and non-tender  Msk:  Back normal, normal gait. Normal strength and tone for age. Extremities: No clubbing, cyanosis or edema.   Neuro: Alert and oriented X 3. Psych:  Good affect, responds appropriately  TELEMETRY: Normal sinus rhythm  ASSESSMENT AND PLAN: Congestive heart failure with severe LV dysfunction with four-chamber dilatation.  Cardiac catheterization will be done as troponins were elevated  with non-STEMI.  Advised cardiac catheterization.  Advised Entresto and carvedilol.  Will arrange for [facility] outpatient as probably samples because patient has difficulty affording.  Active Problems:   PNA (pneumonia)    Dionisio David, MD, [facility] 01/31/2018 9:38 AM

## 2018-01-31 NOTE — Consult Note (Signed)
ANTICOAGULATION CONSULT NOTE - Initial Consult  Pharmacy Consult for Heparin Indication: chest pain/ACS  Allergies  Allergen Reactions  . Bactrim [Sulfamethoxazole-Trimethoprim] Nausea And Vomiting  . Relafen [Nabumetone]     Patient Measurements: Height: 5\' 6"  (167.6 cm) Weight: 163 lb 3.2 oz (74 kg) IBW/kg (Calculated) : 59.3 Heparin Dosing Weight: 71.7kg  Vital Signs: Temp: 97.8 F (36.6 C) (12/04 1947) Temp Source: Oral (12/04 1947) BP: 129/63 (12/04 1947) Pulse Rate: 84 (12/04 1947)  Labs: Recent Labs    01/29/18 1335 01/29/18 1820 01/29/18 2337  01/30/18 0547 01/30/18 0856 01/30/18 1658 01/31/18 0203  HGB 12.2  --   --   --  10.9*  --   --  10.8*  HCT 36.7  --   --   --  34.4*  --   --  33.8*  PLT 306  --   --   --  275  --   --  263  APTT 30  --   --   --   --   --   --   --   LABPROT 12.2  --   --   --   --   --   --   --   INR 0.91  --   --   --   --   --   --   --   HEPARINUNFRC  --  0.32  --    < >  --  0.15* 0.24* 0.37  CREATININE 0.93  --   --   --  1.30*  --   --   --   TROPONINI 0.71* 0.98* 0.97*  --  0.95*  --   --   --    < > = values in this interval not displayed.    Estimated Creatinine Clearance: 48.6 mL/min (A) (by C-G formula based on SCr of 1.3 mg/dL (H)).   Medical History: Past Medical History:  Diagnosis Date  . Diabetes mellitus without complication ([place])   . Fibromyalgia   . Hypertension   . Leg cramping   . Nerve pain   . Stroke [facility])     Medications:  No anticoagulants pta  Assessment: Pharmacy has been consulted for Heparin dosing for ACS/STEMI Goal of Therapy:  Heparin level 0.3-0.7 units/ml Monitor platelets by anticoagulation protocol: Yes   Plan:  12/05 @ 0200 HL 0.37 therapeutic. Will continue current rate and will recheck HL in 6 hours. Will monitor CBC w/ am labs.  Tobie Lords, PharmD,BCPS Clinical Pharmacist 01/31/2018 4:33 AM

## 2018-02-01 LAB — CBC
HEMATOCRIT: 33.8 % — AB (ref 36.0–46.0)
HEMOGLOBIN: 10.8 g/dL — AB (ref 12.0–15.0)
MCH: 30.8 pg (ref 26.0–34.0)
MCHC: 32 g/dL (ref 30.0–36.0)
MCV: 96.3 fL (ref 80.0–100.0)
Platelets: 266 10*3/uL (ref 150–400)
RBC: 3.51 MIL/uL — ABNORMAL LOW (ref 3.87–5.11)
RDW: 12.8 % (ref 11.5–15.5)
WBC: 10.7 10*3/uL — ABNORMAL HIGH (ref 4.0–10.5)
nRBC: 0 % (ref 0.0–0.2)

## 2018-02-01 LAB — BASIC METABOLIC PANEL
Anion gap: 4 — ABNORMAL LOW (ref 5–15)
BUN: 39 mg/dL — ABNORMAL HIGH (ref 6–20)
CO2: 37 mmol/L — ABNORMAL HIGH (ref 22–32)
Calcium: 8.7 mg/dL — ABNORMAL LOW (ref 8.9–10.3)
Chloride: 100 mmol/L (ref 98–111)
Creatinine, Ser: 1.5 mg/dL — ABNORMAL HIGH (ref 0.44–1.00)
GFR calc Af Amer: 44 mL/min — ABNORMAL LOW (ref >60)
GFR calc non Af Amer: 38 mL/min — ABNORMAL LOW (ref >60)
Glucose, Bld: 190 mg/dL — ABNORMAL HIGH (ref 70–99)
Potassium: 4.2 mmol/L (ref 3.5–5.1)
Sodium: 141 mmol/L (ref 135–145)

## 2018-02-01 LAB — GLUCOSE, CAPILLARY
Glucose-Capillary: 187 mg/dL — ABNORMAL HIGH (ref 70–99)
Glucose-Capillary: 240 mg/dL — ABNORMAL HIGH (ref 70–99)
Glucose-Capillary: 219 mg/dL — ABNORMAL HIGH (ref 70–99)

## 2018-02-01 LAB — HEPARIN LEVEL (UNFRACTIONATED): Heparin Unfractionated: <0.1 IU/mL — ABNORMAL LOW (ref 0.30–0.70)

## 2018-02-01 MED ORDER — EZETIMIBE 10 MG PO TABS: 10.0000 mg | ORAL_TABLET | Freq: Every day | ORAL | 0 refills | Status: DC

## 2018-02-01 MED ORDER — CLOPIDOGREL BISULFATE 75 MG PO TABS: 75.0000 mg | ORAL_TABLET | Freq: Every day | ORAL | 0 refills | Status: AC

## 2018-02-01 MED ORDER — SACUBITRIL-VALSARTAN 24-26 MG PO TABS: 1.0000 | ORAL_TABLET | Freq: Two times a day (BID) | ORAL | 0 refills | Status: DC

## 2018-02-01 MED ORDER — CEFUROXIME AXETIL 500 MG PO TABS: 500.0000 mg | ORAL_TABLET | Freq: Two times a day (BID) | ORAL | 0 refills | Status: DC

## 2018-02-01 MED ORDER — ASPIRIN 81 MG PO CHEW: 81.0000 mg | CHEWABLE_TABLET | Freq: Every day | ORAL | Status: AC

## 2018-02-01 MED ORDER — CARVEDILOL 6.25 MG PO TABS: 6.2500 mg | ORAL_TABLET | Freq: Two times a day (BID) | ORAL | 0 refills | Status: AC

## 2018-02-01 MED ORDER — FUROSEMIDE 40 MG PO TABS: 40.0000 mg | ORAL_TABLET | Freq: Two times a day (BID) | ORAL | 0 refills | Status: DC

## 2018-02-01 MED ORDER — INSULIN ASPART 100 UNIT/ML ~~LOC~~ SOLN
3.0000 [IU] | Freq: Three times a day (TID) | SUBCUTANEOUS | Status: DC
  Administered 2018-02-01 (×2): 3 [IU] via SUBCUTANEOUS
  Filled 2018-02-01 (×2): qty 1

## 2018-02-01 NOTE — Progress Notes (Signed)
SUBJECTIVE: Continues to feel short of breath with inspiratory chest pain. Pt reports right groin incision has no excessive pain.  Vitals:   01/31/18 1953 01/31/18 2130 02/01/18 0415 02/01/18 0801  BP: (!) 102/55 (!) 113/56 138/72 (!) 141/68  Pulse: 84 88 83 90  Resp:    19  Temp: 97.8 F (36.6 C)  98 F (36.7 C) 97.6 F (36.4 C)  TempSrc: Oral  Oral Oral  SpO2: 95%  93% 97%  Weight:   74 kg   Height:        Intake/Output Summary (Last 24 hours) at 02/01/2018 0852 Last data filed at 02/01/2018 0426 Gross per 24 hour  Intake 157.69 ml  Output 3600 ml  Net -3442.31 ml    LABS: Basic Metabolic Panel: Recent Labs    01/30/18 0547 02/01/18 0441  NA 140 141  K 3.7 4.2  CL 101 100  CO2 33* 37*  GLUCOSE 252* 190*  BUN 26* 39*  CREATININE 1.30* 1.50*  CALCIUM 8.3* 8.7*   Liver Function Tests: Recent Labs    01/29/18 1335  AST 51*  ALT 74*  ALKPHOS 194*  BILITOT 0.5  PROT 6.4*  ALBUMIN 2.8*   No results for input(s): LIPASE, AMYLASE in the last 72 hours. CBC: Recent Labs    01/31/18 0203 02/01/18 0441  WBC 9.3 10.7*  HGB 10.8* 10.8*  HCT 33.8* 33.8*  MCV 96.6 96.3  PLT 263 266   Cardiac Enzymes: Recent Labs    01/29/18 1820 01/29/18 2337 01/30/18 0547  TROPONINI 0.98* 0.97* 0.95*   BNP: Invalid input(s): POCBNP D-Dimer: No results for input(s): DDIMER in the last 72 hours. Hemoglobin A1C: Recent Labs    01/29/18 1820  HGBA1C 9.7*   Fasting Lipid Panel: Recent Labs    01/29/18 1820  CHOL 286*  HDL 35*  LDLCALC 216*  TRIG 173*  CHOLHDL 8.2   Thyroid Function Tests: Recent Labs    01/29/18 1820  TSH 2.745   Anemia Panel: No results for input(s): VITAMINB12, FOLATE, FERRITIN, TIBC, IRON, RETICCTPCT in the last 72 hours.   PHYSICAL EXAM General: Pale, weak, mildly short of breath HEENT:  Normocephalic and atramatic Neck:  No JVD.  Lungs: Clear bilaterally to auscultation and percussion. Heart: HRRR . Normal S1 and S2 without  gallops or murmurs.  Abdomen: Bowel sounds are positive, abdomen soft and non-tender  Msk:  Back normal, normal gait. Normal strength and tone for age. Extremities: Right groin cath site is non-tender, no bruits or bleeding. No LE edema.   Neuro: Alert and oriented X 3. Psych:  Good affect, responds appropriately  TELEMETRY: NSR 85bpm  ASSESSMENT AND PLAN:  NSTEMI with S/P Coronary Cath: High-grade 95% stenosis mid RCA With successful PCI with overlapping 3.0 x 38 mm and 3.0 x 12 mm Resolute Onyx stents. Continue plavix, aspirin, Zetia, lipitor 80mg .   HFrEF: CHF with LVEF 25%. Continue entresto, coreg, and lasix. Will order Life[facility]est referral due to ischemic cardiomyopathy. Remains short of breath. May require home oxygen.     Active Problems:   PNA (pneumonia)    Colleen Bathe, NP-C 02/01/2018 8:52 AM Cell: [phone]

## 2018-02-01 NOTE — Plan of Care (Signed)
Nutrition Education Note  RD consulted for nutrition education regarding CHF.  Spoke with pt at bedside. Pt reports that her appetite has improved since admission. Noted 100% completed lunch meal tray at bedside.  Pt states that she normally eats 1 full meal daily. Pt may have pizza or takeout at meals. Pt states that she stopped cooking at home due to vision loss which caused her to burn herself several times. Pt states that her husband is disabled and does not cook much but does pick up takeout.  RD provided "Low Sodium Nutrition Therapy" handout from the Academy of [employer]. Reviewed patient's dietary recall. Provided examples on ways to decrease sodium intake in diet and best choices for foods at restaurants if needed. Discouraged intake of processed foods and use of salt shaker. Encouraged fresh fruits and vegetables as well as whole grain sources of carbohydrates to maximize fiber intake.   RD discussed why it is important for patient to adhere to diet recommendations, and emphasized the role of fluids, foods to avoid, and importance of weighing self daily. Teach back method used.  Expect fair compliance.  Body mass index is 26.33 kg/m. Pt meets criteria for overweight based on current BMI.  Current diet order is Heart Healthy/Carb Modified, patient is consuming approximately 75% of meals at this time. Labs and medications reviewed. No further nutrition interventions warranted at this time. RD contact information provided. If additional nutrition issues arise, please re-consult RD.    Gaynell Face, MS, RD, LDN Inpatient Clinical Dietitian Pager: [phone] Weekend/After Hours: [phone]

## 2018-02-01 NOTE — Plan of Care (Signed)
  Problem: Clinical Measurements: Goal: Will remain free from infection Outcome: Progressing Note:  Remains afebrile, continues on antibiotics Goal: Diagnostic test results will improve Outcome: Progressing Note:  WBC 10.7, K 4.2   Problem: Respiratory: Goal: Ability to maintain adequate ventilation will improve Outcome: Progressing Note:  Remains on antibiotics   Problem: Cardiac: Goal: Ability to achieve and maintain adequate cardiovascular perfusion will improve Outcome: Progressing Note:  Denies  chest pain this shift

## 2018-02-01 NOTE — Progress Notes (Addendum)
Cardiovascular and Pulmonary Nurse Navigator Note:    57 year old female with history of DM, chronic systolic heart failure, Fibromyalgia, HTN, Leg cramping, Nerve pain, and Stroke who presented to ED with SOB.  Patient admitted with acute hypoxic respiratory failure, acute on chronic CHF, community acquired pneumonia, and uncontrolled diabetes.    ECHO COMPLETE WO IMAGING ENHANCING AGENT  Order# [id number]  Reading physician: Nori Riis, PA-C Ordering physician: Dionisio David, MD Study date: 01/29/18  Study Result   Result status: Final result  ---------------------------------------------------------- Transthoracic Echocardiography  Patient:    Cabela, [place] MR #:       [id number] Study Date: 01/29/2018 Gender:     F Age:        39 Height:     167.6 cm Weight:     71.7 kg BSA:        1.84 m^2 Pt. Status: Room:       260A   Dorann Ou, [place], Shaukat A  SONOGRAPHER  Cindy Hazy, RDCS  ADMITTING    Mody, Sital P  ATTENDING    Mody, Sital P  PERFORMING   [facility]  cc:  ------------------------------------------------------------------- LV EF: 25%  ------------------------------------------------------------------- Indications:      R94.31 Abnormal EKG.  ------------------------------------------------------------------- History:   PMH:  Acquired from the patient and from the patient&'s chart.  PMH:  Stroke.  Risk factors:  Hypertension. Diabetes mellitus.  ------------------------------------------------------------------- Study Conclusions  - Left ventricle: The cavity size was moderately dilated. Systolic   function was severely reduced. The estimated ejection fraction   was 25%. Diffuse hypokinesis. Doppler parameters are consistent   with abnormal left ventricular relaxation (grade 1 diastolic   dysfunction). - Mitral valve: There was mild regurgitation. - Left atrium: The atrium was mildly  dilated. - Atrial septum: No defect or patent foramen ovale was identified.  Impressions:  - Dilated left atrium and Left ventricle with severe LV systolic   dysfunction with diffuse hypokinesis. Mild MR/TR.   Patient s/p cardiac cath which revealed 95% stenosis of mid RCA and 70% stenosis of prox / mid RCA.  Patient underwent successful PCI with DES x 2. Life Vest Referral made by cardiology.  This RN faxed all required documents to [place] at [phone] at 11 a.m. this a.m.    Awaiting insurance authorization.  [facility] Life Vest Rep is aware patient is for probable discharge today.    Patient sitting up in the recliner chair and husband leaving the room when this nurse entered the room.   Patient informed this RN that she is unable to see to read.  Patient stated she lost her vision as a result fo her stroke.  Patient stated her husband can read her information to her.    EDUCATION:   "Heart Attack Bouncing Back" booklet given and reviewed with patient. Discussed the definition of CAD. Reviewed the location of CAD and where her stent was placed. Informed patient she will be given a stent card. Explained the purpose of the stent card. Instructed patient to keep stent card in her wallet.  ? Discussed modifiable risk factors including controlling blood pressure, cholesterol, and blood sugar; following heart healthy diet; maintaining healthy weight; exercise; and smoking cessation.  ? Discussed cardiac medications including rationale for taking, mechanisms of action, and side effects. Stressed the importance of taking medications as prescribed.  ? Discussed emergency plan for heart attack symptoms. Patient verbalized understanding  of need to call 911 and not to drive himself to ER if having cardiac symptoms / chest pain. Discussed the use of SL NTG.  Patient reports that she was using SL NTG at home prior to admission for angina.   ? Heart healthy diet of low sodium, low fat, low  cholesterol  heart healthy carb modified diet discussed. Information provided.  Dietitian Consultation ordered. Note: Diet education completed by Dietitian.   ? Smoking Cessation - Patient is a current every day smoker.  "Thinking about Quitting - Yes You Can!" informational sheet reviewed with patient. Patient stated, "I'm not sure I can quit.  I'm not sure I want to quit."   ? Exercise - Benefits of exercised discussed. Patient has back and leg issues.  Patient reported going to outpatient Physical Therapy in the past and she was unable to exercise on machines due to back / leg pain.   Informed patient that her cardiologist has referred her to outpatient Cardiac Rehab. An overview of the program was provided. Patient stated she wasn't sure she would be physically able to participate.  Patient has [facility] ordered for in-home PT.  This RN explained that Cardiac Rehab would be appropriate after she has completed in-home PT.  Explained to patient the Cardiac Rehab dept will contact her by phone to check on her progress with in-home PT.   ? CHF EDUCATION:   Educational session with patient completed.  Provided patient with "Living Better with Heart Failure" packet. Briefly reviewed definition of heart failure and signs and symptoms of an exacerbation.?Explained to patient that HF is a chronic illness which requires self-assessment / self-management along with help from the cardiologist/PCP.?? ? *Reviewed importance of and reason behind checking weight daily in the AM, after using the bathroom, but before getting dressed. Patient has scales.  ? *Reviewed with patient the following information: *Discussed when to call the Dr= weight gain of >2-3lb overnight of 5lb in a week,  *Discussed yellow zone= call MD: weight gain of >2-3lb overnight of 5lb in a week, increased swelling, increased SOB when lying down, chest discomfort, dizziness, increased fatigue *Red Zone= call 911: struggle to breath, fainting or  near fainting, significant chest pain   *Diet - Reviewed low sodium diet-provided handout of recommended and not recommended foods. Dietitian Consultation for education completed today.  ? *Discussed fluid intake with patient as well. Patient not currently on a fluid restriction, but advised no more than 8-8 ounces glass of fluids per day.? ? *Instructed patient to take medications as prescribed for heart failure. Explained briefly why pt is on the medications (either make you feel better, live longer or keep you out of the hospital) and discussed monitoring and side effects.  ? *Discussed exercise. See above.  ? *Smoking Cessation- Patient is a CURRENT SMOKER. SEE ABOVE.   ? *[facility] - Explained the purpose of the HF Clinic. ?Explained to patient the HF Clinic does not replace PCP nor Cardiologist, but is an additional resource to helping patient manage heart failure at home.  New Patient Appointment in [facility] on 02/14/2018 at 12:20 p.m.   ? Again, the 5 Steps to Living Better with Heart Failure were reviewed with patient.  Patient thanked me for providing the above information. ? ? Roanna Epley, RN, BSN, [facility]? [facility]  Cardiovascular &?Pulmonary Nurse Navigator  Direct Line: [phone]  Department Phone #: [phone] Fax: [phone]? Email Address: [email or web address]

## 2018-02-01 NOTE — Care Management Note (Signed)
Case Management Note  Patient Details  Name: Colleen Peters MRN: [id number] Date of Birth: [birth year]  Subjective/Objective:   Patient is from home and lives with husband.  She has been admitted with pneumonia and A-fibrillation.   She will be starting on Eliquis.  30 day free coupon given to patient.  She states she will probably not be able to afford medication.  This RNCM attempted to call [employer] to ask how much her co-pay would be.  They would not give any information.  Called [facility] on [address] and could not get anyone on the phone.  Attempted 3 times.  Educated patient on [employer] assistance program.  Explained she needs to call the number in the pamphlet and tell them "I cannot afford my medication and I need assistance".  She verbalizes understanding.  Patient has bee on 3L o2 while in hospital.  She did not qualify for home oxygen today.  After offering choice, made referral to [facility] for home health RN, PT, OT, Aide and SW.  Asked DR. Mody for order for overnight oximetry in the home on room air to qualify for home oxygen.  Patient is waiting for authorization today for discharge with a Life Vest.  She uses a walker and a cane.  She does have a scale and has been educated on weighing daily.  [facility] referral placed by RNCM.                  Action/Plan:   Expected Discharge Date:  02/01/18               Expected Discharge Plan:  [place]  In-House Referral:     Discharge planning Services  CM Consult  Post Acute Care Choice:    Choice offered to:  Patient  DME Arranged:    DME Agency:     HH Arranged:  RN, PT, OT, Nurse's Aide, Social Work [employer] Agency:  [place]  Status of Service:  Completed, signed off  If discussed at [employer], dates discussed:    Additional Comments:  Elza Rafter, RN 02/01/2018, 2:13 PM

## 2018-02-01 NOTE — Discharge Summary (Signed)
[facility] [place] NAME: Colleen Peters    MR#:  [id number]  DATE OF BIRTH:  Mar 03, 1960  DATE OF ADMISSION:  01/29/2018 ADMITTING PHYSICIAN: Bettey Costa, MD  DATE OF DISCHARGE: 02/01/2018  PRIMARY CARE PHYSICIAN: Cletis Athens, MD    ADMISSION DIAGNOSIS:  NSTEMI (non-ST elevated myocardial infarction) ([place]) [I21.4] Community acquired pneumonia, unspecified laterality [J18.9]  DISCHARGE DIAGNOSIS:  Active Problems:   PNA (pneumonia)  NSTEMI SECONDARY DIAGNOSIS:   Past Medical History:  Diagnosis Date  . Diabetes mellitus without complication ([place])   . Fibromyalgia   . Hypertension   . Leg cramping   . Nerve pain   . Stroke [facility])     HOSPITAL COURSE:   1.  Acute hypoxic respiratory failure in the setting of community-acquired pneumonia and acute on chronic systolic heart failure She may need gentle discharge.  2.  Non-STEMI: Patient status post cardiac catheterization today which shows  Mid RCA lesion is 95% stenosed.  A drug-eluting stent was successfully placed using a STENT RESOLUTE [id number].  Post intervention, there is a 0% residual stenosis.  Prox RCA to Mid RCA lesion is 70% stenosed.  A drug-eluting stent was successfully placed using a STENT RESOLUTE ONYX 3.0X12.  Post intervention, there is a 0% residual stenosis.  1. High-grade 95% stenosis mid RCA 2. Successful PCI with overlapping 3.0 x 38 mm and 3.0 x 12 mm Resolute Onyx stents   Continue aspirin, Zetia, Coreg, Plavix  Have outpatient follow-up with cardiology on Monday. LifeVest referral made by cardiology due to ischemic cardiomyopathy.  3.  Community-acquired pneumonia: Will be discharged on oral Ceftin.  She was on Rocephin and azithromycin while in the hospital.  4.  Acute on chronic systolic heart failure with four-chamber dilation and severe LV dysfunction of 25%: She will continue Entresto as recommended by cardiology She will  continue Lasix.  She will have CHF clinic upon discharge.  She will need to monitor her weight and daily intake and output.  This was discussed with the patient.   5.  Uncontrolled diabetes  Hemoglobin A1c 9.7: She will continue current care however she needs close outpatient follow-up with PCP.      DISCHARGE CONDITIONS AND DIET:   Stable for discharge on heart healthy diet/diabetic diet  CONSULTS OBTAINED:  Treatment Team:  Dionisio David, MD  DRUG ALLERGIES:   Allergies  Allergen Reactions  . Bactrim [Sulfamethoxazole-Trimethoprim] Nausea And Vomiting  . Relafen [Nabumetone]     DISCHARGE MEDICATIONS:   Allergies as of 02/01/2018      Reactions   Bactrim [sulfamethoxazole-trimethoprim] Nausea And Vomiting   Relafen [nabumetone]       Medication List    STOP taking these medications   hydrochlorothiazide 25 MG tablet Commonly known as:  HYDRODIURIL   lisinopril 20 MG tablet Commonly known as:  PRINIVIL,ZESTRIL   metoprolol succinate 25 MG 24 hr tablet Commonly known as:  TOPROL-XL     TAKE these medications   aspirin 81 MG chewable tablet Chew 1 tablet (81 mg total) by mouth daily. Start taking on:  02/02/2018   atorvastatin 80 MG tablet Commonly known as:  LIPITOR Take 80 mg by mouth at bedtime.   BC FAST PAIN RELIEF PO Take 1 tablet by mouth 4 (four) times daily as needed (for pain).   carvedilol 6.25 MG tablet Commonly known as:  COREG Take 1 tablet (6.25 mg total) by mouth 2 (two) times daily with a meal.  cefUROXime 500 MG tablet Commonly known as:  CEFTIN Take 1 tablet (500 mg total) by mouth 2 (two) times daily with a meal.   clopidogrel 75 MG tablet Commonly known as:  PLAVIX Take 1 tablet (75 mg total) by mouth daily with breakfast. Start taking on:  02/02/2018   cyclobenzaprine 10 MG tablet Commonly known as:  FLEXERIL Take 10 mg by mouth 3 (three) times daily as needed for muscle spasms.   diazepam 5 MG tablet Commonly known as:   VALIUM Take 2.5 mg by mouth every 6 (six) hours as needed for muscle spasms.   ezetimibe 10 MG tablet Commonly known as:  ZETIA Take 1 tablet (10 mg total) by mouth daily. Start taking on:  02/02/2018   furosemide 40 MG tablet Commonly known as:  LASIX Take 1 tablet (40 mg total) by mouth 2 (two) times daily.   gabapentin 600 MG tablet Commonly known as:  NEURONTIN Take 600 mg by mouth 2 (two) times daily.   insulin aspart protamine- aspart (70-30) 100 UNIT/ML injection Commonly known as:  NOVOLOG MIX 70/30 Inject 0-50 Units into the skin 2 (two) times daily with a meal. Patient reports that she takes 70/30 BID based on glucose (if glucose good then she does not take any; if glucose <250 mg/d she takes 25 units if she is going to eat; if glucose >250 and she is going to eat she takes 50 units)   magnesium citrate Soln Take 296 mLs by mouth daily as needed for moderate constipation.   metFORMIN 500 MG tablet Commonly known as:  GLUCOPHAGE Take 500 mg by mouth 2 (two) times daily with a meal.   nitroGLYCERIN 0.4 MG SL tablet Commonly known as:  NITROSTAT Place 0.4 mg under the tongue every 5 (five) minutes as needed for chest pain.   omeprazole 20 MG capsule Commonly known as:  PRILOSEC Take 20 mg by mouth daily.   oxyCODONE-acetaminophen 7.5-325 MG tablet Commonly known as:  PERCOCET Take 0.5 tablets by mouth 4 (four) times daily.   sacubitril-valsartan 24-26 MG Commonly known as:  ENTRESTO Take 1 tablet by mouth 2 (two) times daily.            Durable Medical Equipment  (From admission, onward)         Start     Ordered   02/01/18 1025  DME Oxygen  Once    Question Answer Comment  Mode or (Route) Nasal cannula   Liters per Minute 2   Frequency Continuous (stationary and portable oxygen unit needed)   Oxygen conserving device Yes   Oxygen delivery system Gas      02/01/18 1025            Today   CHIEF COMPLAINT:   Patient is feeling better  however she has some very mild shortness of breath   VITAL SIGNS:  Blood pressure (!) 141/68, pulse 90, temperature 97.6 F (36.4 C), temperature source Oral, resp. rate 19, height 5\' 6"  (1.676 m), weight 74 kg, SpO2 97 %.   REVIEW OF SYSTEMS:  Review of Systems  Constitutional: Negative.  Negative for chills, fever and malaise/fatigue.  HENT: Negative.  Negative for ear discharge, ear pain, hearing loss, nosebleeds and sore throat.   Eyes: Negative.  Negative for blurred vision and pain.  Respiratory: Positive for shortness of breath. Negative for cough, hemoptysis and wheezing.   Cardiovascular: Negative.  Negative for chest pain, palpitations and leg swelling.  Gastrointestinal: Negative.  Negative for abdominal  pain, blood in stool, diarrhea, nausea and vomiting.  Genitourinary: Negative.  Negative for dysuria.  Musculoskeletal: Negative.  Negative for back pain.  Skin: Negative.   Neurological: Negative for dizziness, tremors, speech change, focal weakness, seizures and headaches.  Endo/Heme/Allergies: Negative.  Does not bruise/bleed easily.  Psychiatric/Behavioral: Negative.  Negative for depression, hallucinations and suicidal ideas.     PHYSICAL EXAMINATION:  GENERAL:  57 y.o.-year-old patient lying in the bed with no acute distress.  NECK:  Supple, no jugular venous distention. No thyroid enlargement, no tenderness.  LUNGS: Normal breath sounds bilaterally, no wheezing, rales,rhonchi  No use of accessory muscles of respiration.  CARDIOVASCULAR: S1, S2 normal. 2/6 murmur, rubs, or gallops.  ABDOMEN: Soft, non-tender, non-distended. Bowel sounds present. No organomegaly or mass.  EXTREMITIES: No pedal edema, cyanosis, or clubbing.  PSYCHIATRIC: The patient is alert and oriented x 3.  SKIN: No obvious rash, lesion, or ulcer.   DATA REVIEW:   CBC Recent Labs  Lab 02/01/18 0441  WBC 10.7*  HGB 10.8*  HCT 33.8*  PLT 266    Chemistries  Recent Labs  Lab  01/29/18 1335  02/01/18 0441  NA 133*   < > 141  K 3.7   < > 4.2  CL 95*   < > 100  CO2 31   < > 37*  GLUCOSE 243*   < > 190*  BUN 24*   < > 39*  CREATININE 0.93   < > 1.50*  CALCIUM 8.5*   < > 8.7*  AST 51*  --   --   ALT 74*  --   --   ALKPHOS 194*  --   --   BILITOT 0.5  --   --    < > = values in this interval not displayed.    Cardiac Enzymes Recent Labs  Lab 01/29/18 1820 01/29/18 2337 01/30/18 0547  TROPONINI 0.98* 0.97* 0.95*    Microbiology Results  @MICRORSLT48 @  RADIOLOGY:  No results found.    Allergies as of 02/01/2018      Reactions   Bactrim [sulfamethoxazole-trimethoprim] Nausea And Vomiting   Relafen [nabumetone]       Medication List    STOP taking these medications   hydrochlorothiazide 25 MG tablet Commonly known as:  HYDRODIURIL   lisinopril 20 MG tablet Commonly known as:  PRINIVIL,ZESTRIL   metoprolol succinate 25 MG 24 hr tablet Commonly known as:  TOPROL-XL     TAKE these medications   aspirin 81 MG chewable tablet Chew 1 tablet (81 mg total) by mouth daily. Start taking on:  02/02/2018   atorvastatin 80 MG tablet Commonly known as:  LIPITOR Take 80 mg by mouth at bedtime.   BC FAST PAIN RELIEF PO Take 1 tablet by mouth 4 (four) times daily as needed (for pain).   carvedilol 6.25 MG tablet Commonly known as:  COREG Take 1 tablet (6.25 mg total) by mouth 2 (two) times daily with a meal.   cefUROXime 500 MG tablet Commonly known as:  CEFTIN Take 1 tablet (500 mg total) by mouth 2 (two) times daily with a meal.   clopidogrel 75 MG tablet Commonly known as:  PLAVIX Take 1 tablet (75 mg total) by mouth daily with breakfast. Start taking on:  02/02/2018   cyclobenzaprine 10 MG tablet Commonly known as:  FLEXERIL Take 10 mg by mouth 3 (three) times daily as needed for muscle spasms.   diazepam 5 MG tablet Commonly known as:  VALIUM Take 2.5 mg  by mouth every 6 (six) hours as needed for muscle spasms.   ezetimibe 10  MG tablet Commonly known as:  ZETIA Take 1 tablet (10 mg total) by mouth daily. Start taking on:  02/02/2018   furosemide 40 MG tablet Commonly known as:  LASIX Take 1 tablet (40 mg total) by mouth 2 (two) times daily.   gabapentin 600 MG tablet Commonly known as:  NEURONTIN Take 600 mg by mouth 2 (two) times daily.   insulin aspart protamine- aspart (70-30) 100 UNIT/ML injection Commonly known as:  NOVOLOG MIX 70/30 Inject 0-50 Units into the skin 2 (two) times daily with a meal. Patient reports that she takes 70/30 BID based on glucose (if glucose good then she does not take any; if glucose <250 mg/d she takes 25 units if she is going to eat; if glucose >250 and she is going to eat she takes 50 units)   magnesium citrate Soln Take 296 mLs by mouth daily as needed for moderate constipation.   metFORMIN 500 MG tablet Commonly known as:  GLUCOPHAGE Take 500 mg by mouth 2 (two) times daily with a meal.   nitroGLYCERIN 0.4 MG SL tablet Commonly known as:  NITROSTAT Place 0.4 mg under the tongue every 5 (five) minutes as needed for chest pain.   omeprazole 20 MG capsule Commonly known as:  PRILOSEC Take 20 mg by mouth daily.   oxyCODONE-acetaminophen 7.5-325 MG tablet Commonly known as:  PERCOCET Take 0.5 tablets by mouth 4 (four) times daily.   sacubitril-valsartan 24-26 MG Commonly known as:  ENTRESTO Take 1 tablet by mouth 2 (two) times daily.            Durable Medical Equipment  (From admission, onward)         Start     Ordered   02/01/18 1025  DME Oxygen  Once    Question Answer Comment  Mode or (Route) Nasal cannula   Liters per Minute 2   Frequency Continuous (stationary and portable oxygen unit needed)   Oxygen conserving device Yes   Oxygen delivery system Gas      02/01/18 1025             Management plans discussed with the patient and she is in agreement. Stable for discharge home  Patient should follow up with cardiology  CODE STATUS:      Code Status Orders  (From admission, onward)         Start     Ordered   01/29/18 1744  Full code  Continuous     01/29/18 1743        Code Status History    This patient has a current code status but no historical code status.      TOTAL TIME TAKING CARE OF THIS PATIENT: 39 minutes.    Note: This dictation was prepared with Dragon dictation along with smaller phrase technology. Any transcriptional errors that result from this process are unintentional.  Carlie Corpus M.D on 02/01/2018 at 10:27 AM  Between 7am to 6pm - Pager - [phone] After 6pm go to [email or web address] - password [email or web address] [facility]  Office  [phone]  CC: Primary care physician; Cletis Athens, MD

## 2018-02-01 NOTE — Care Management Important Message (Signed)
Copy of signed IM left with patient in room.  

## 2018-02-01 NOTE — Progress Notes (Signed)
SATURATION QUALIFICATIONS: (This note is used to comply with regulatory documentation for home oxygen)  Patient Saturations on Room Air at Rest = 91%  Patient Saturations on Room Air while Ambulating = 90%  Patient Saturations on  Liters of oxygen while Ambulating = %  Please briefly explain why patient needs home oxygen:

## 2018-02-01 NOTE — Progress Notes (Signed)
Inpatient Diabetes Program Recommendations  AACE/ADA: New Consensus Statement on Inpatient Glycemic Control (2019)  Target Ranges:  Prepandial:   less than 140 mg/dL      Peak postprandial:   less than 180 mg/dL (1-2 hours)      Critically ill patients:  140 - 180 mg/dL  Results for Colleen Peters, Colleen Peters (MRN [id number]) as of 02/01/2018 07:27  Ref. Range 02/01/2018 04:41  Glucose Latest Ref Range: 70 - 99 mg/dL 190 (H)   Results for Colleen Peters, Colleen Peters (MRN [id number]) as of 02/01/2018 07:27  Ref. Range 01/31/2018 07:37 01/31/2018 11:54 01/31/2018 16:48 01/31/2018 21:22  Glucose-Capillary Latest Ref Range: 70 - 99 mg/dL 235 (H) 151 (H) 211 (H) 311 (H)   Review of Glycemic Control  Diabetes history:DM2 Outpatient Diabetes medications:70/30 0-50 units BID (based on glucose and if she eats), Metformin 500 mg BID Current orders for Inpatient glycemic control: Lantus 5 units daily, Novolog 0-9 units TID with meals  Inpatient Diabetes Program Recommendations:  Insulin - Meal Coverage: Please consider ordering Novolog 3 units TID with mealas for meal coverage if patient eats at least 50% of meals.  Thanks, Barnie Alderman, RN, MSN, CDE Diabetes Coordinator Inpatient Diabetes Program [phone] (Team Pager from 8am to 5pm)

## 2018-02-03 LAB — CULTURE, BLOOD (ROUTINE X 2)
CULTURE: NO GROWTH
Culture: NO GROWTH
Special Requests: ADEQUATE

## 2018-02-04 ENCOUNTER — Encounter: Payer: Self-pay | Admitting: Cardiovascular Disease

## 2018-02-04 DIAGNOSIS — Z7901 Long term (current) use of anticoagulants: Secondary | ICD-10-CM | POA: Diagnosis not present

## 2018-02-04 DIAGNOSIS — Z955 Presence of coronary angioplasty implant and graft: Secondary | ICD-10-CM | POA: Diagnosis not present

## 2018-02-04 DIAGNOSIS — E1165 Type 2 diabetes mellitus with hyperglycemia: Secondary | ICD-10-CM | POA: Diagnosis not present

## 2018-02-04 DIAGNOSIS — Z8673 Personal history of transient ischemic attack (TIA), and cerebral infarction without residual deficits: Secondary | ICD-10-CM | POA: Diagnosis not present

## 2018-02-04 DIAGNOSIS — I25119 Atherosclerotic heart disease of native coronary artery with unspecified angina pectoris: Secondary | ICD-10-CM | POA: Diagnosis not present

## 2018-02-04 DIAGNOSIS — F17210 Nicotine dependence, cigarettes, uncomplicated: Secondary | ICD-10-CM | POA: Diagnosis not present

## 2018-02-04 DIAGNOSIS — Z9181 History of falling: Secondary | ICD-10-CM | POA: Diagnosis not present

## 2018-02-04 DIAGNOSIS — R9431 Abnormal electrocardiogram [ECG] [EKG]: Secondary | ICD-10-CM | POA: Diagnosis not present

## 2018-02-04 DIAGNOSIS — J9601 Acute respiratory failure with hypoxia: Secondary | ICD-10-CM | POA: Diagnosis not present

## 2018-02-04 DIAGNOSIS — Z792 Long term (current) use of antibiotics: Secondary | ICD-10-CM | POA: Diagnosis not present

## 2018-02-04 DIAGNOSIS — J189 Pneumonia, unspecified organism: Secondary | ICD-10-CM | POA: Diagnosis not present

## 2018-02-04 DIAGNOSIS — Z79899 Other long term (current) drug therapy: Secondary | ICD-10-CM | POA: Diagnosis not present

## 2018-02-04 DIAGNOSIS — Z9981 Dependence on supplemental oxygen: Secondary | ICD-10-CM | POA: Diagnosis not present

## 2018-02-04 DIAGNOSIS — Z7982 Long term (current) use of aspirin: Secondary | ICD-10-CM | POA: Diagnosis not present

## 2018-02-04 DIAGNOSIS — I110 Hypertensive heart disease with heart failure: Secondary | ICD-10-CM | POA: Diagnosis not present

## 2018-02-04 DIAGNOSIS — Z794 Long term (current) use of insulin: Secondary | ICD-10-CM | POA: Diagnosis not present

## 2018-02-04 DIAGNOSIS — I5023 Acute on chronic systolic (congestive) heart failure: Secondary | ICD-10-CM | POA: Diagnosis not present

## 2018-02-04 DIAGNOSIS — R7989 Other specified abnormal findings of blood chemistry: Secondary | ICD-10-CM | POA: Diagnosis not present

## 2018-02-04 DIAGNOSIS — I214 Non-ST elevation (NSTEMI) myocardial infarction: Secondary | ICD-10-CM | POA: Diagnosis not present

## 2018-02-05 ENCOUNTER — Encounter: Payer: Medicare Other | Admitting: Physical Therapy

## 2018-02-05 DIAGNOSIS — Z87891 Personal history of nicotine dependence: Secondary | ICD-10-CM | POA: Diagnosis not present

## 2018-02-05 DIAGNOSIS — E1140 Type 2 diabetes mellitus with diabetic neuropathy, unspecified: Secondary | ICD-10-CM | POA: Diagnosis not present

## 2018-02-05 DIAGNOSIS — M961 Postlaminectomy syndrome, not elsewhere classified: Secondary | ICD-10-CM | POA: Diagnosis not present

## 2018-02-05 DIAGNOSIS — M797 Fibromyalgia: Secondary | ICD-10-CM | POA: Diagnosis not present

## 2018-02-06 DIAGNOSIS — I110 Hypertensive heart disease with heart failure: Secondary | ICD-10-CM | POA: Diagnosis not present

## 2018-02-06 DIAGNOSIS — E1165 Type 2 diabetes mellitus with hyperglycemia: Secondary | ICD-10-CM | POA: Diagnosis not present

## 2018-02-06 DIAGNOSIS — J9601 Acute respiratory failure with hypoxia: Secondary | ICD-10-CM | POA: Diagnosis not present

## 2018-02-06 DIAGNOSIS — I5023 Acute on chronic systolic (congestive) heart failure: Secondary | ICD-10-CM | POA: Diagnosis not present

## 2018-02-06 DIAGNOSIS — I214 Non-ST elevation (NSTEMI) myocardial infarction: Secondary | ICD-10-CM | POA: Diagnosis not present

## 2018-02-06 DIAGNOSIS — J189 Pneumonia, unspecified organism: Secondary | ICD-10-CM | POA: Diagnosis not present

## 2018-02-07 DIAGNOSIS — J189 Pneumonia, unspecified organism: Secondary | ICD-10-CM | POA: Diagnosis not present

## 2018-02-07 DIAGNOSIS — I214 Non-ST elevation (NSTEMI) myocardial infarction: Secondary | ICD-10-CM | POA: Diagnosis not present

## 2018-02-07 DIAGNOSIS — E1165 Type 2 diabetes mellitus with hyperglycemia: Secondary | ICD-10-CM | POA: Diagnosis not present

## 2018-02-07 DIAGNOSIS — I110 Hypertensive heart disease with heart failure: Secondary | ICD-10-CM | POA: Diagnosis not present

## 2018-02-07 DIAGNOSIS — J9601 Acute respiratory failure with hypoxia: Secondary | ICD-10-CM | POA: Diagnosis not present

## 2018-02-07 DIAGNOSIS — I5023 Acute on chronic systolic (congestive) heart failure: Secondary | ICD-10-CM | POA: Diagnosis not present

## 2018-02-08 DIAGNOSIS — I214 Non-ST elevation (NSTEMI) myocardial infarction: Secondary | ICD-10-CM | POA: Diagnosis not present

## 2018-02-08 DIAGNOSIS — J9601 Acute respiratory failure with hypoxia: Secondary | ICD-10-CM | POA: Diagnosis not present

## 2018-02-08 DIAGNOSIS — J189 Pneumonia, unspecified organism: Secondary | ICD-10-CM | POA: Diagnosis not present

## 2018-02-08 DIAGNOSIS — I5023 Acute on chronic systolic (congestive) heart failure: Secondary | ICD-10-CM | POA: Diagnosis not present

## 2018-02-08 DIAGNOSIS — E1165 Type 2 diabetes mellitus with hyperglycemia: Secondary | ICD-10-CM | POA: Diagnosis not present

## 2018-02-08 DIAGNOSIS — I110 Hypertensive heart disease with heart failure: Secondary | ICD-10-CM | POA: Diagnosis not present

## 2018-02-11 DIAGNOSIS — E1165 Type 2 diabetes mellitus with hyperglycemia: Secondary | ICD-10-CM | POA: Diagnosis not present

## 2018-02-11 DIAGNOSIS — I5023 Acute on chronic systolic (congestive) heart failure: Secondary | ICD-10-CM | POA: Diagnosis not present

## 2018-02-11 DIAGNOSIS — I214 Non-ST elevation (NSTEMI) myocardial infarction: Secondary | ICD-10-CM | POA: Diagnosis not present

## 2018-02-11 DIAGNOSIS — I110 Hypertensive heart disease with heart failure: Secondary | ICD-10-CM | POA: Diagnosis not present

## 2018-02-11 DIAGNOSIS — J9601 Acute respiratory failure with hypoxia: Secondary | ICD-10-CM | POA: Diagnosis not present

## 2018-02-11 DIAGNOSIS — J189 Pneumonia, unspecified organism: Secondary | ICD-10-CM | POA: Diagnosis not present

## 2018-02-12 ENCOUNTER — Encounter: Payer: Medicare Other | Admitting: Physical Therapy

## 2018-02-12 DIAGNOSIS — E1165 Type 2 diabetes mellitus with hyperglycemia: Secondary | ICD-10-CM | POA: Diagnosis not present

## 2018-02-12 DIAGNOSIS — I214 Non-ST elevation (NSTEMI) myocardial infarction: Secondary | ICD-10-CM | POA: Diagnosis not present

## 2018-02-12 DIAGNOSIS — I5023 Acute on chronic systolic (congestive) heart failure: Secondary | ICD-10-CM | POA: Diagnosis not present

## 2018-02-12 DIAGNOSIS — J189 Pneumonia, unspecified organism: Secondary | ICD-10-CM | POA: Diagnosis not present

## 2018-02-12 DIAGNOSIS — J9601 Acute respiratory failure with hypoxia: Secondary | ICD-10-CM | POA: Diagnosis not present

## 2018-02-12 DIAGNOSIS — I110 Hypertensive heart disease with heart failure: Secondary | ICD-10-CM | POA: Diagnosis not present

## 2018-02-13 DIAGNOSIS — J189 Pneumonia, unspecified organism: Secondary | ICD-10-CM | POA: Diagnosis not present

## 2018-02-13 DIAGNOSIS — I110 Hypertensive heart disease with heart failure: Secondary | ICD-10-CM | POA: Diagnosis not present

## 2018-02-13 DIAGNOSIS — E1165 Type 2 diabetes mellitus with hyperglycemia: Secondary | ICD-10-CM | POA: Diagnosis not present

## 2018-02-13 DIAGNOSIS — I5023 Acute on chronic systolic (congestive) heart failure: Secondary | ICD-10-CM | POA: Diagnosis not present

## 2018-02-13 DIAGNOSIS — J9601 Acute respiratory failure with hypoxia: Secondary | ICD-10-CM | POA: Diagnosis not present

## 2018-02-13 DIAGNOSIS — I214 Non-ST elevation (NSTEMI) myocardial infarction: Secondary | ICD-10-CM | POA: Diagnosis not present

## 2018-02-14 ENCOUNTER — Ambulatory Visit: Payer: Medicare Other | Admitting: Family

## 2018-02-14 DIAGNOSIS — J189 Pneumonia, unspecified organism: Secondary | ICD-10-CM | POA: Diagnosis not present

## 2018-02-14 DIAGNOSIS — I214 Non-ST elevation (NSTEMI) myocardial infarction: Secondary | ICD-10-CM | POA: Diagnosis not present

## 2018-02-14 DIAGNOSIS — I110 Hypertensive heart disease with heart failure: Secondary | ICD-10-CM | POA: Diagnosis not present

## 2018-02-14 DIAGNOSIS — I5023 Acute on chronic systolic (congestive) heart failure: Secondary | ICD-10-CM | POA: Diagnosis not present

## 2018-02-14 DIAGNOSIS — J9601 Acute respiratory failure with hypoxia: Secondary | ICD-10-CM | POA: Diagnosis not present

## 2018-02-14 DIAGNOSIS — E1165 Type 2 diabetes mellitus with hyperglycemia: Secondary | ICD-10-CM | POA: Diagnosis not present

## 2018-02-15 DIAGNOSIS — J449 Chronic obstructive pulmonary disease, unspecified: Secondary | ICD-10-CM | POA: Diagnosis not present

## 2018-02-15 DIAGNOSIS — R0602 Shortness of breath: Secondary | ICD-10-CM | POA: Diagnosis not present

## 2018-02-15 DIAGNOSIS — R079 Chest pain, unspecified: Secondary | ICD-10-CM | POA: Diagnosis not present

## 2018-02-15 DIAGNOSIS — E1165 Type 2 diabetes mellitus with hyperglycemia: Secondary | ICD-10-CM | POA: Diagnosis not present

## 2018-02-15 DIAGNOSIS — Z9861 Coronary angioplasty status: Secondary | ICD-10-CM | POA: Diagnosis not present

## 2018-02-15 DIAGNOSIS — J9601 Acute respiratory failure with hypoxia: Secondary | ICD-10-CM | POA: Diagnosis not present

## 2018-02-15 DIAGNOSIS — J189 Pneumonia, unspecified organism: Secondary | ICD-10-CM | POA: Diagnosis not present

## 2018-02-15 DIAGNOSIS — I214 Non-ST elevation (NSTEMI) myocardial infarction: Secondary | ICD-10-CM | POA: Diagnosis not present

## 2018-02-15 DIAGNOSIS — I110 Hypertensive heart disease with heart failure: Secondary | ICD-10-CM | POA: Diagnosis not present

## 2018-02-15 DIAGNOSIS — I10 Essential (primary) hypertension: Secondary | ICD-10-CM | POA: Diagnosis not present

## 2018-02-15 DIAGNOSIS — I2510 Atherosclerotic heart disease of native coronary artery without angina pectoris: Secondary | ICD-10-CM | POA: Diagnosis not present

## 2018-02-15 DIAGNOSIS — I5023 Acute on chronic systolic (congestive) heart failure: Secondary | ICD-10-CM | POA: Diagnosis not present

## 2018-02-18 DIAGNOSIS — I214 Non-ST elevation (NSTEMI) myocardial infarction: Secondary | ICD-10-CM | POA: Diagnosis not present

## 2018-02-18 DIAGNOSIS — R079 Chest pain, unspecified: Secondary | ICD-10-CM | POA: Diagnosis not present

## 2018-02-18 DIAGNOSIS — J189 Pneumonia, unspecified organism: Secondary | ICD-10-CM | POA: Diagnosis not present

## 2018-02-18 DIAGNOSIS — I6522 Occlusion and stenosis of left carotid artery: Secondary | ICD-10-CM | POA: Diagnosis not present

## 2018-02-18 DIAGNOSIS — J9601 Acute respiratory failure with hypoxia: Secondary | ICD-10-CM | POA: Diagnosis not present

## 2018-02-18 DIAGNOSIS — I5023 Acute on chronic systolic (congestive) heart failure: Secondary | ICD-10-CM | POA: Diagnosis not present

## 2018-02-18 DIAGNOSIS — I110 Hypertensive heart disease with heart failure: Secondary | ICD-10-CM | POA: Diagnosis not present

## 2018-02-18 DIAGNOSIS — E1165 Type 2 diabetes mellitus with hyperglycemia: Secondary | ICD-10-CM | POA: Diagnosis not present

## 2018-02-19 ENCOUNTER — Encounter: Payer: Medicare Other | Admitting: Physical Therapy

## 2018-02-21 DIAGNOSIS — E1165 Type 2 diabetes mellitus with hyperglycemia: Secondary | ICD-10-CM | POA: Diagnosis not present

## 2018-02-21 DIAGNOSIS — I214 Non-ST elevation (NSTEMI) myocardial infarction: Secondary | ICD-10-CM | POA: Diagnosis not present

## 2018-02-21 DIAGNOSIS — I5023 Acute on chronic systolic (congestive) heart failure: Secondary | ICD-10-CM | POA: Diagnosis not present

## 2018-02-21 DIAGNOSIS — J9601 Acute respiratory failure with hypoxia: Secondary | ICD-10-CM | POA: Diagnosis not present

## 2018-02-21 DIAGNOSIS — J189 Pneumonia, unspecified organism: Secondary | ICD-10-CM | POA: Diagnosis not present

## 2018-02-21 DIAGNOSIS — I110 Hypertensive heart disease with heart failure: Secondary | ICD-10-CM | POA: Diagnosis not present

## 2018-02-22 DIAGNOSIS — I110 Hypertensive heart disease with heart failure: Secondary | ICD-10-CM | POA: Diagnosis not present

## 2018-02-22 DIAGNOSIS — E1165 Type 2 diabetes mellitus with hyperglycemia: Secondary | ICD-10-CM | POA: Diagnosis not present

## 2018-02-22 DIAGNOSIS — J9601 Acute respiratory failure with hypoxia: Secondary | ICD-10-CM | POA: Diagnosis not present

## 2018-02-22 DIAGNOSIS — I214 Non-ST elevation (NSTEMI) myocardial infarction: Secondary | ICD-10-CM | POA: Diagnosis not present

## 2018-02-22 DIAGNOSIS — I5023 Acute on chronic systolic (congestive) heart failure: Secondary | ICD-10-CM | POA: Diagnosis not present

## 2018-02-22 DIAGNOSIS — J189 Pneumonia, unspecified organism: Secondary | ICD-10-CM | POA: Diagnosis not present

## 2018-02-25 DIAGNOSIS — I110 Hypertensive heart disease with heart failure: Secondary | ICD-10-CM | POA: Diagnosis not present

## 2018-02-25 DIAGNOSIS — I2510 Atherosclerotic heart disease of native coronary artery without angina pectoris: Secondary | ICD-10-CM | POA: Diagnosis not present

## 2018-02-25 DIAGNOSIS — I63139 Cerebral infarction due to embolism of unspecified carotid artery: Secondary | ICD-10-CM | POA: Diagnosis not present

## 2018-02-25 DIAGNOSIS — J449 Chronic obstructive pulmonary disease, unspecified: Secondary | ICD-10-CM | POA: Diagnosis not present

## 2018-02-25 DIAGNOSIS — Z9861 Coronary angioplasty status: Secondary | ICD-10-CM | POA: Diagnosis not present

## 2018-02-25 DIAGNOSIS — I5023 Acute on chronic systolic (congestive) heart failure: Secondary | ICD-10-CM | POA: Diagnosis not present

## 2018-02-25 DIAGNOSIS — R079 Chest pain, unspecified: Secondary | ICD-10-CM | POA: Diagnosis not present

## 2018-02-25 DIAGNOSIS — E1165 Type 2 diabetes mellitus with hyperglycemia: Secondary | ICD-10-CM | POA: Diagnosis not present

## 2018-02-25 DIAGNOSIS — I214 Non-ST elevation (NSTEMI) myocardial infarction: Secondary | ICD-10-CM | POA: Diagnosis not present

## 2018-02-25 DIAGNOSIS — J9601 Acute respiratory failure with hypoxia: Secondary | ICD-10-CM | POA: Diagnosis not present

## 2018-02-25 DIAGNOSIS — R0602 Shortness of breath: Secondary | ICD-10-CM | POA: Diagnosis not present

## 2018-02-25 DIAGNOSIS — I10 Essential (primary) hypertension: Secondary | ICD-10-CM | POA: Diagnosis not present

## 2018-02-25 DIAGNOSIS — J189 Pneumonia, unspecified organism: Secondary | ICD-10-CM | POA: Diagnosis not present

## 2018-02-26 DIAGNOSIS — E1165 Type 2 diabetes mellitus with hyperglycemia: Secondary | ICD-10-CM | POA: Diagnosis not present

## 2018-02-26 DIAGNOSIS — J189 Pneumonia, unspecified organism: Secondary | ICD-10-CM | POA: Diagnosis not present

## 2018-02-26 DIAGNOSIS — I110 Hypertensive heart disease with heart failure: Secondary | ICD-10-CM | POA: Diagnosis not present

## 2018-02-26 DIAGNOSIS — I5023 Acute on chronic systolic (congestive) heart failure: Secondary | ICD-10-CM | POA: Diagnosis not present

## 2018-02-26 DIAGNOSIS — J9601 Acute respiratory failure with hypoxia: Secondary | ICD-10-CM | POA: Diagnosis not present

## 2018-02-26 DIAGNOSIS — I214 Non-ST elevation (NSTEMI) myocardial infarction: Secondary | ICD-10-CM | POA: Diagnosis not present

## 2018-02-28 DIAGNOSIS — I5023 Acute on chronic systolic (congestive) heart failure: Secondary | ICD-10-CM | POA: Diagnosis not present

## 2018-02-28 DIAGNOSIS — J9601 Acute respiratory failure with hypoxia: Secondary | ICD-10-CM | POA: Diagnosis not present

## 2018-02-28 DIAGNOSIS — I214 Non-ST elevation (NSTEMI) myocardial infarction: Secondary | ICD-10-CM | POA: Diagnosis not present

## 2018-02-28 DIAGNOSIS — I110 Hypertensive heart disease with heart failure: Secondary | ICD-10-CM | POA: Diagnosis not present

## 2018-02-28 DIAGNOSIS — E1165 Type 2 diabetes mellitus with hyperglycemia: Secondary | ICD-10-CM | POA: Diagnosis not present

## 2018-02-28 DIAGNOSIS — J189 Pneumonia, unspecified organism: Secondary | ICD-10-CM | POA: Diagnosis not present

## 2018-03-01 DIAGNOSIS — I214 Non-ST elevation (NSTEMI) myocardial infarction: Secondary | ICD-10-CM | POA: Diagnosis not present

## 2018-03-01 DIAGNOSIS — E1165 Type 2 diabetes mellitus with hyperglycemia: Secondary | ICD-10-CM | POA: Diagnosis not present

## 2018-03-01 DIAGNOSIS — I110 Hypertensive heart disease with heart failure: Secondary | ICD-10-CM | POA: Diagnosis not present

## 2018-03-01 DIAGNOSIS — J9601 Acute respiratory failure with hypoxia: Secondary | ICD-10-CM | POA: Diagnosis not present

## 2018-03-01 DIAGNOSIS — J189 Pneumonia, unspecified organism: Secondary | ICD-10-CM | POA: Diagnosis not present

## 2018-03-01 DIAGNOSIS — I5023 Acute on chronic systolic (congestive) heart failure: Secondary | ICD-10-CM | POA: Diagnosis not present

## 2018-03-04 ENCOUNTER — Ambulatory Visit: Payer: Medicare Other | Admitting: Family

## 2018-03-04 DIAGNOSIS — J9601 Acute respiratory failure with hypoxia: Secondary | ICD-10-CM | POA: Diagnosis not present

## 2018-03-04 DIAGNOSIS — I5023 Acute on chronic systolic (congestive) heart failure: Secondary | ICD-10-CM | POA: Diagnosis not present

## 2018-03-04 DIAGNOSIS — E1165 Type 2 diabetes mellitus with hyperglycemia: Secondary | ICD-10-CM | POA: Diagnosis not present

## 2018-03-04 DIAGNOSIS — I214 Non-ST elevation (NSTEMI) myocardial infarction: Secondary | ICD-10-CM | POA: Diagnosis not present

## 2018-03-04 DIAGNOSIS — I110 Hypertensive heart disease with heart failure: Secondary | ICD-10-CM | POA: Diagnosis not present

## 2018-03-04 DIAGNOSIS — J189 Pneumonia, unspecified organism: Secondary | ICD-10-CM | POA: Diagnosis not present

## 2018-03-05 DIAGNOSIS — J189 Pneumonia, unspecified organism: Secondary | ICD-10-CM | POA: Diagnosis not present

## 2018-03-05 DIAGNOSIS — J9601 Acute respiratory failure with hypoxia: Secondary | ICD-10-CM | POA: Diagnosis not present

## 2018-03-05 DIAGNOSIS — I5023 Acute on chronic systolic (congestive) heart failure: Secondary | ICD-10-CM | POA: Diagnosis not present

## 2018-03-05 DIAGNOSIS — E1165 Type 2 diabetes mellitus with hyperglycemia: Secondary | ICD-10-CM | POA: Diagnosis not present

## 2018-03-05 DIAGNOSIS — I110 Hypertensive heart disease with heart failure: Secondary | ICD-10-CM | POA: Diagnosis not present

## 2018-03-05 DIAGNOSIS — I214 Non-ST elevation (NSTEMI) myocardial infarction: Secondary | ICD-10-CM | POA: Diagnosis not present

## 2018-03-07 DIAGNOSIS — I214 Non-ST elevation (NSTEMI) myocardial infarction: Secondary | ICD-10-CM | POA: Diagnosis not present

## 2018-03-07 DIAGNOSIS — I5023 Acute on chronic systolic (congestive) heart failure: Secondary | ICD-10-CM | POA: Diagnosis not present

## 2018-03-07 DIAGNOSIS — E1165 Type 2 diabetes mellitus with hyperglycemia: Secondary | ICD-10-CM | POA: Diagnosis not present

## 2018-03-07 DIAGNOSIS — I110 Hypertensive heart disease with heart failure: Secondary | ICD-10-CM | POA: Diagnosis not present

## 2018-03-07 DIAGNOSIS — J189 Pneumonia, unspecified organism: Secondary | ICD-10-CM | POA: Diagnosis not present

## 2018-03-07 DIAGNOSIS — I63139 Cerebral infarction due to embolism of unspecified carotid artery: Secondary | ICD-10-CM | POA: Diagnosis not present

## 2018-03-07 DIAGNOSIS — J9601 Acute respiratory failure with hypoxia: Secondary | ICD-10-CM | POA: Diagnosis not present

## 2018-03-11 DIAGNOSIS — I509 Heart failure, unspecified: Secondary | ICD-10-CM | POA: Diagnosis not present

## 2018-03-11 DIAGNOSIS — R0602 Shortness of breath: Secondary | ICD-10-CM | POA: Diagnosis not present

## 2018-03-11 DIAGNOSIS — I10 Essential (primary) hypertension: Secondary | ICD-10-CM | POA: Diagnosis not present

## 2018-03-11 DIAGNOSIS — I420 Dilated cardiomyopathy: Secondary | ICD-10-CM | POA: Diagnosis not present

## 2018-03-11 DIAGNOSIS — I2510 Atherosclerotic heart disease of native coronary artery without angina pectoris: Secondary | ICD-10-CM | POA: Diagnosis not present

## 2018-03-12 DIAGNOSIS — E1165 Type 2 diabetes mellitus with hyperglycemia: Secondary | ICD-10-CM | POA: Diagnosis not present

## 2018-03-12 DIAGNOSIS — I110 Hypertensive heart disease with heart failure: Secondary | ICD-10-CM | POA: Diagnosis not present

## 2018-03-12 DIAGNOSIS — J189 Pneumonia, unspecified organism: Secondary | ICD-10-CM | POA: Diagnosis not present

## 2018-03-12 DIAGNOSIS — J9601 Acute respiratory failure with hypoxia: Secondary | ICD-10-CM | POA: Diagnosis not present

## 2018-03-12 DIAGNOSIS — I214 Non-ST elevation (NSTEMI) myocardial infarction: Secondary | ICD-10-CM | POA: Diagnosis not present

## 2018-03-12 DIAGNOSIS — I5023 Acute on chronic systolic (congestive) heart failure: Secondary | ICD-10-CM | POA: Diagnosis not present

## 2018-03-18 ENCOUNTER — Ambulatory Visit: Admit: 2018-03-18 | Payer: Medicare Other | Admitting: Unknown Physician Specialty

## 2018-03-18 SURGERY — COLONOSCOPY WITH PROPOFOL
Anesthesia: General

## 2018-04-01 DIAGNOSIS — I5023 Acute on chronic systolic (congestive) heart failure: Secondary | ICD-10-CM | POA: Diagnosis not present

## 2018-04-01 DIAGNOSIS — I214 Non-ST elevation (NSTEMI) myocardial infarction: Secondary | ICD-10-CM | POA: Diagnosis not present

## 2018-04-01 DIAGNOSIS — I509 Heart failure, unspecified: Secondary | ICD-10-CM | POA: Diagnosis not present

## 2018-04-01 DIAGNOSIS — I420 Dilated cardiomyopathy: Secondary | ICD-10-CM | POA: Diagnosis not present

## 2018-04-01 DIAGNOSIS — R0602 Shortness of breath: Secondary | ICD-10-CM | POA: Diagnosis not present

## 2018-04-01 DIAGNOSIS — Z9861 Coronary angioplasty status: Secondary | ICD-10-CM | POA: Diagnosis not present

## 2018-04-08 DIAGNOSIS — I119 Hypertensive heart disease without heart failure: Secondary | ICD-10-CM | POA: Diagnosis not present

## 2018-04-08 DIAGNOSIS — I509 Heart failure, unspecified: Secondary | ICD-10-CM | POA: Diagnosis not present

## 2018-04-08 DIAGNOSIS — I43 Cardiomyopathy in diseases classified elsewhere: Secondary | ICD-10-CM | POA: Diagnosis not present

## 2018-04-08 DIAGNOSIS — I69369 Other paralytic syndrome following cerebral infarction affecting unspecified side: Secondary | ICD-10-CM | POA: Diagnosis not present

## 2018-04-25 DIAGNOSIS — I420 Dilated cardiomyopathy: Secondary | ICD-10-CM | POA: Diagnosis not present

## 2018-04-25 DIAGNOSIS — I10 Essential (primary) hypertension: Secondary | ICD-10-CM | POA: Diagnosis not present

## 2018-04-25 DIAGNOSIS — R0602 Shortness of breath: Secondary | ICD-10-CM | POA: Diagnosis not present

## 2018-04-25 DIAGNOSIS — I509 Heart failure, unspecified: Secondary | ICD-10-CM | POA: Diagnosis not present

## 2018-04-25 DIAGNOSIS — I5042 Chronic combined systolic (congestive) and diastolic (congestive) heart failure: Secondary | ICD-10-CM | POA: Diagnosis not present

## 2018-04-25 DIAGNOSIS — I2510 Atherosclerotic heart disease of native coronary artery without angina pectoris: Secondary | ICD-10-CM | POA: Diagnosis not present

## 2018-04-25 DIAGNOSIS — L03116 Cellulitis of left lower limb: Secondary | ICD-10-CM | POA: Diagnosis not present

## 2018-05-06 ENCOUNTER — Encounter: Payer: Self-pay | Admitting: Podiatry

## 2018-05-06 ENCOUNTER — Encounter: Payer: Self-pay | Admitting: *Deleted

## 2018-05-06 ENCOUNTER — Other Ambulatory Visit: Payer: Self-pay | Admitting: *Deleted

## 2018-05-06 ENCOUNTER — Ambulatory Visit (INDEPENDENT_AMBULATORY_CARE_PROVIDER_SITE_OTHER): Payer: Medicare Other | Admitting: Podiatry

## 2018-05-06 VITALS — BP 154/79 | HR 88

## 2018-05-06 DIAGNOSIS — L97422 Non-pressure chronic ulcer of left heel and midfoot with fat layer exposed: Secondary | ICD-10-CM

## 2018-05-06 DIAGNOSIS — R234 Changes in skin texture: Secondary | ICD-10-CM

## 2018-05-06 DIAGNOSIS — E11621 Type 2 diabetes mellitus with foot ulcer: Secondary | ICD-10-CM

## 2018-05-06 DIAGNOSIS — E1142 Type 2 diabetes mellitus with diabetic polyneuropathy: Secondary | ICD-10-CM | POA: Diagnosis not present

## 2018-05-06 NOTE — Progress Notes (Addendum)
This patient presents the office for a scheduled diabetic foot exam.  During the exam she states that she is having severe pain and discomfort on the heels of both feet.  She says her right foot has a fissure that causes pain or discomfort as she applies weight to her right foot.  She says she has developed an open painful ulcer on the heel of the left foot.  She says this ulcer has been present for 3 months and has been treated with antibiotics by her cardiologist.  She presents the office today wearing no bandage at the site of the ulcer left heel.  She states that is extremely painful and she is unable to allow me to touch her foot due to the severity of the pain.  She says there is occasional drainage coming from the ulcer on the left heel.  She presents the office today for diabetic foot exam and treatment of these painful heels both feet.  Patient is taking plavix.  General Appearance  Alert, conversant and in no acute stress.  Vascular  Dorsalis pedis  are palpable  bilaterally. Posterior tibial pulses are absent  Bilateral. Capillary return is within normal limits  bilaterally. Temperature is within normal limits  bilaterally.  Neurologic  Senn-Weinstein monofilament wire test diminished  bilaterally. Muscle power within normal limits bilaterally.  Nails Thick disfigured discolored nails with subungual debris  from hallux to fifth toes bilaterally. No evidence of bacterial infection or drainage bilaterally.  Orthopedic  No limitations of motion  feet .  No crepitus or effusions noted.  No bony pathology or digital deformities noted.  Skin  normotropic skin with no porokeratosis noted bilaterally. Except both heels.  Fissure noted posterior plantar aspect right heel.  No signs of drainage noted.  Patient has an ulcer on the plantar medial aspect of the left heel.  This ulcer is covered by eschar.  No evidence of any drainage pus or infection noted today.  There is redness and swelling and extreme  pain noted at the site of this ulcer.  Diabetic Neuropathy  Ulcer left heel  Fissure right heel.  Initial exam.  Patient does have diminished L OPS and diminished pulses both feet.  Patient has a closed and healing fissure noted on the right heel.  Patient does have an ulcer left heel with eschar noted covering the ulcer itself.  Due to the fact that this ulcer has been present for approximately 3 months already I recommended she be evaluated and treated by the wound care center here in [place].  Patient was dispensed heel cushion and told to use this when she sits in her chair and lays in bed.  Patient to await a phone call from the wound care center.  Return to clinic as needed.  Diabetic foot exam performed.  Gardiner Barefoot DPM

## 2018-05-07 DIAGNOSIS — I69369 Other paralytic syndrome following cerebral infarction affecting unspecified side: Secondary | ICD-10-CM | POA: Diagnosis not present

## 2018-05-07 DIAGNOSIS — M961 Postlaminectomy syndrome, not elsewhere classified: Secondary | ICD-10-CM | POA: Diagnosis not present

## 2018-05-07 DIAGNOSIS — M5440 Lumbago with sciatica, unspecified side: Secondary | ICD-10-CM | POA: Diagnosis not present

## 2018-05-07 DIAGNOSIS — E1140 Type 2 diabetes mellitus with diabetic neuropathy, unspecified: Secondary | ICD-10-CM | POA: Diagnosis not present

## 2018-06-27 DIAGNOSIS — L97419 Non-pressure chronic ulcer of right heel and midfoot with unspecified severity: Secondary | ICD-10-CM | POA: Diagnosis not present

## 2018-06-27 DIAGNOSIS — I5021 Acute systolic (congestive) heart failure: Secondary | ICD-10-CM | POA: Diagnosis not present

## 2018-06-27 DIAGNOSIS — L089 Local infection of the skin and subcutaneous tissue, unspecified: Secondary | ICD-10-CM | POA: Diagnosis not present

## 2018-06-27 DIAGNOSIS — E11628 Type 2 diabetes mellitus with other skin complications: Secondary | ICD-10-CM | POA: Diagnosis not present

## 2018-06-27 DIAGNOSIS — L97429 Non-pressure chronic ulcer of left heel and midfoot with unspecified severity: Secondary | ICD-10-CM | POA: Diagnosis not present

## 2018-06-27 DIAGNOSIS — E11621 Type 2 diabetes mellitus with foot ulcer: Secondary | ICD-10-CM | POA: Diagnosis not present

## 2018-06-27 DIAGNOSIS — K592 Neurogenic bowel, not elsewhere classified: Secondary | ICD-10-CM | POA: Diagnosis not present

## 2018-06-27 DIAGNOSIS — I5042 Chronic combined systolic (congestive) and diastolic (congestive) heart failure: Secondary | ICD-10-CM | POA: Diagnosis not present

## 2018-06-27 DIAGNOSIS — L97519 Non-pressure chronic ulcer of other part of right foot with unspecified severity: Secondary | ICD-10-CM | POA: Diagnosis not present

## 2018-06-27 DIAGNOSIS — I509 Heart failure, unspecified: Secondary | ICD-10-CM | POA: Diagnosis not present

## 2018-06-27 DIAGNOSIS — D62 Acute posthemorrhagic anemia: Secondary | ICD-10-CM | POA: Diagnosis not present

## 2018-06-27 DIAGNOSIS — N179 Acute kidney failure, unspecified: Secondary | ICD-10-CM | POA: Diagnosis not present

## 2018-06-27 DIAGNOSIS — I130 Hypertensive heart and chronic kidney disease with heart failure and stage 1 through stage 4 chronic kidney disease, or unspecified chronic kidney disease: Secondary | ICD-10-CM | POA: Diagnosis not present

## 2018-06-27 DIAGNOSIS — E13621 Other specified diabetes mellitus with foot ulcer: Secondary | ICD-10-CM | POA: Diagnosis not present

## 2018-06-27 DIAGNOSIS — I110 Hypertensive heart disease with heart failure: Secondary | ICD-10-CM | POA: Diagnosis not present

## 2018-06-28 DIAGNOSIS — L97419 Non-pressure chronic ulcer of right heel and midfoot with unspecified severity: Secondary | ICD-10-CM | POA: Diagnosis present

## 2018-06-28 DIAGNOSIS — I63332 Cerebral infarction due to thrombosis of left posterior cerebral artery: Secondary | ICD-10-CM | POA: Diagnosis not present

## 2018-06-28 DIAGNOSIS — I509 Heart failure, unspecified: Secondary | ICD-10-CM | POA: Diagnosis not present

## 2018-06-28 DIAGNOSIS — L97409 Non-pressure chronic ulcer of unspecified heel and midfoot with unspecified severity: Secondary | ICD-10-CM | POA: Diagnosis not present

## 2018-06-28 DIAGNOSIS — I517 Cardiomegaly: Secondary | ICD-10-CM | POA: Diagnosis not present

## 2018-06-28 DIAGNOSIS — Z8701 Personal history of pneumonia (recurrent): Secondary | ICD-10-CM | POA: Diagnosis not present

## 2018-06-28 DIAGNOSIS — L97429 Non-pressure chronic ulcer of left heel and midfoot with unspecified severity: Secondary | ICD-10-CM | POA: Diagnosis not present

## 2018-06-28 DIAGNOSIS — I739 Peripheral vascular disease, unspecified: Secondary | ICD-10-CM | POA: Diagnosis not present

## 2018-06-28 DIAGNOSIS — K5900 Constipation, unspecified: Secondary | ICD-10-CM | POA: Diagnosis not present

## 2018-06-28 DIAGNOSIS — I252 Old myocardial infarction: Secondary | ICD-10-CM | POA: Diagnosis not present

## 2018-06-28 DIAGNOSIS — I130 Hypertensive heart and chronic kidney disease with heart failure and stage 1 through stage 4 chronic kidney disease, or unspecified chronic kidney disease: Secondary | ICD-10-CM | POA: Diagnosis present

## 2018-06-28 DIAGNOSIS — Z8673 Personal history of transient ischemic attack (TIA), and cerebral infarction without residual deficits: Secondary | ICD-10-CM | POA: Diagnosis not present

## 2018-06-28 DIAGNOSIS — E119 Type 2 diabetes mellitus without complications: Secondary | ICD-10-CM | POA: Diagnosis not present

## 2018-06-28 DIAGNOSIS — Z8669 Personal history of other diseases of the nervous system and sense organs: Secondary | ICD-10-CM | POA: Diagnosis not present

## 2018-06-28 DIAGNOSIS — Z7982 Long term (current) use of aspirin: Secondary | ICD-10-CM | POA: Diagnosis not present

## 2018-06-28 DIAGNOSIS — N319 Neuromuscular dysfunction of bladder, unspecified: Secondary | ICD-10-CM | POA: Diagnosis not present

## 2018-06-28 DIAGNOSIS — Z7984 Long term (current) use of oral hypoglycemic drugs: Secondary | ICD-10-CM | POA: Diagnosis not present

## 2018-06-28 DIAGNOSIS — R4182 Altered mental status, unspecified: Secondary | ICD-10-CM | POA: Diagnosis not present

## 2018-06-28 DIAGNOSIS — K5909 Other constipation: Secondary | ICD-10-CM | POA: Diagnosis not present

## 2018-06-28 DIAGNOSIS — E13621 Other specified diabetes mellitus with foot ulcer: Secondary | ICD-10-CM | POA: Diagnosis not present

## 2018-06-28 DIAGNOSIS — D62 Acute posthemorrhagic anemia: Secondary | ICD-10-CM | POA: Diagnosis not present

## 2018-06-28 DIAGNOSIS — I10 Essential (primary) hypertension: Secondary | ICD-10-CM | POA: Diagnosis not present

## 2018-06-28 DIAGNOSIS — I5042 Chronic combined systolic (congestive) and diastolic (congestive) heart failure: Secondary | ICD-10-CM | POA: Diagnosis present

## 2018-06-28 DIAGNOSIS — F17200 Nicotine dependence, unspecified, uncomplicated: Secondary | ICD-10-CM | POA: Diagnosis not present

## 2018-06-28 DIAGNOSIS — D649 Anemia, unspecified: Secondary | ICD-10-CM | POA: Diagnosis not present

## 2018-06-28 DIAGNOSIS — R0902 Hypoxemia: Secondary | ICD-10-CM | POA: Diagnosis not present

## 2018-06-28 DIAGNOSIS — Z01810 Encounter for preprocedural cardiovascular examination: Secondary | ICD-10-CM | POA: Diagnosis not present

## 2018-06-28 DIAGNOSIS — I348 Other nonrheumatic mitral valve disorders: Secondary | ICD-10-CM | POA: Diagnosis not present

## 2018-06-28 DIAGNOSIS — I700 Atherosclerosis of aorta: Secondary | ICD-10-CM | POA: Diagnosis not present

## 2018-06-28 DIAGNOSIS — R339 Retention of urine, unspecified: Secondary | ICD-10-CM | POA: Diagnosis not present

## 2018-06-28 DIAGNOSIS — E1151 Type 2 diabetes mellitus with diabetic peripheral angiopathy without gangrene: Secondary | ICD-10-CM | POA: Diagnosis present

## 2018-06-28 DIAGNOSIS — E11628 Type 2 diabetes mellitus with other skin complications: Secondary | ICD-10-CM | POA: Diagnosis present

## 2018-06-28 DIAGNOSIS — I358 Other nonrheumatic aortic valve disorders: Secondary | ICD-10-CM | POA: Diagnosis not present

## 2018-06-28 DIAGNOSIS — F17210 Nicotine dependence, cigarettes, uncomplicated: Secondary | ICD-10-CM | POA: Diagnosis present

## 2018-06-28 DIAGNOSIS — E11621 Type 2 diabetes mellitus with foot ulcer: Secondary | ICD-10-CM | POA: Diagnosis not present

## 2018-06-28 DIAGNOSIS — K592 Neurogenic bowel, not elsewhere classified: Secondary | ICD-10-CM | POA: Diagnosis present

## 2018-06-28 DIAGNOSIS — I2510 Atherosclerotic heart disease of native coronary artery without angina pectoris: Secondary | ICD-10-CM | POA: Diagnosis not present

## 2018-06-28 DIAGNOSIS — R410 Disorientation, unspecified: Secondary | ICD-10-CM | POA: Diagnosis not present

## 2018-06-28 DIAGNOSIS — N189 Chronic kidney disease, unspecified: Secondary | ICD-10-CM | POA: Diagnosis present

## 2018-06-28 DIAGNOSIS — M797 Fibromyalgia: Secondary | ICD-10-CM | POA: Diagnosis present

## 2018-06-28 DIAGNOSIS — I5020 Unspecified systolic (congestive) heart failure: Secondary | ICD-10-CM | POA: Diagnosis not present

## 2018-06-28 DIAGNOSIS — L97401 Non-pressure chronic ulcer of unspecified heel and midfoot limited to breakdown of skin: Secondary | ICD-10-CM | POA: Diagnosis not present

## 2018-06-28 DIAGNOSIS — N179 Acute kidney failure, unspecified: Secondary | ICD-10-CM | POA: Diagnosis present

## 2018-06-28 DIAGNOSIS — Z9049 Acquired absence of other specified parts of digestive tract: Secondary | ICD-10-CM | POA: Diagnosis not present

## 2018-06-28 DIAGNOSIS — D3502 Benign neoplasm of left adrenal gland: Secondary | ICD-10-CM | POA: Diagnosis not present

## 2018-06-28 DIAGNOSIS — S91302A Unspecified open wound, left foot, initial encounter: Secondary | ICD-10-CM | POA: Diagnosis not present

## 2018-06-28 DIAGNOSIS — E08621 Diabetes mellitus due to underlying condition with foot ulcer: Secondary | ICD-10-CM | POA: Diagnosis not present

## 2018-06-28 DIAGNOSIS — E1122 Type 2 diabetes mellitus with diabetic chronic kidney disease: Secondary | ICD-10-CM | POA: Diagnosis present

## 2018-06-28 DIAGNOSIS — R931 Abnormal findings on diagnostic imaging of heart and coronary circulation: Secondary | ICD-10-CM | POA: Diagnosis not present

## 2018-06-28 DIAGNOSIS — Z7902 Long term (current) use of antithrombotics/antiplatelets: Secondary | ICD-10-CM | POA: Diagnosis not present

## 2018-07-02 ENCOUNTER — Ambulatory Visit: Payer: Medicare Other | Admitting: Physician Assistant

## 2018-07-03 DIAGNOSIS — L97411 Non-pressure chronic ulcer of right heel and midfoot limited to breakdown of skin: Secondary | ICD-10-CM | POA: Diagnosis not present

## 2018-07-03 DIAGNOSIS — F17210 Nicotine dependence, cigarettes, uncomplicated: Secondary | ICD-10-CM | POA: Diagnosis not present

## 2018-07-03 DIAGNOSIS — Z794 Long term (current) use of insulin: Secondary | ICD-10-CM | POA: Diagnosis not present

## 2018-07-03 DIAGNOSIS — N319 Neuromuscular dysfunction of bladder, unspecified: Secondary | ICD-10-CM | POA: Diagnosis not present

## 2018-07-03 DIAGNOSIS — I129 Hypertensive chronic kidney disease with stage 1 through stage 4 chronic kidney disease, or unspecified chronic kidney disease: Secondary | ICD-10-CM | POA: Diagnosis not present

## 2018-07-03 DIAGNOSIS — L97421 Non-pressure chronic ulcer of left heel and midfoot limited to breakdown of skin: Secondary | ICD-10-CM | POA: Diagnosis not present

## 2018-07-03 DIAGNOSIS — I5020 Unspecified systolic (congestive) heart failure: Secondary | ICD-10-CM | POA: Diagnosis not present

## 2018-07-03 DIAGNOSIS — E11621 Type 2 diabetes mellitus with foot ulcer: Secondary | ICD-10-CM | POA: Diagnosis not present

## 2018-07-03 DIAGNOSIS — I2510 Atherosclerotic heart disease of native coronary artery without angina pectoris: Secondary | ICD-10-CM | POA: Diagnosis not present

## 2018-07-03 DIAGNOSIS — I70203 Unspecified atherosclerosis of native arteries of extremities, bilateral legs: Secondary | ICD-10-CM | POA: Diagnosis not present

## 2018-07-03 DIAGNOSIS — E1151 Type 2 diabetes mellitus with diabetic peripheral angiopathy without gangrene: Secondary | ICD-10-CM | POA: Diagnosis not present

## 2018-07-03 DIAGNOSIS — E1122 Type 2 diabetes mellitus with diabetic chronic kidney disease: Secondary | ICD-10-CM | POA: Diagnosis not present

## 2018-07-03 DIAGNOSIS — Z7982 Long term (current) use of aspirin: Secondary | ICD-10-CM | POA: Diagnosis not present

## 2018-07-03 DIAGNOSIS — I252 Old myocardial infarction: Secondary | ICD-10-CM | POA: Diagnosis not present

## 2018-07-03 DIAGNOSIS — Z79891 Long term (current) use of opiate analgesic: Secondary | ICD-10-CM | POA: Diagnosis not present

## 2018-07-03 DIAGNOSIS — N189 Chronic kidney disease, unspecified: Secondary | ICD-10-CM | POA: Diagnosis not present

## 2018-07-05 DIAGNOSIS — E1151 Type 2 diabetes mellitus with diabetic peripheral angiopathy without gangrene: Secondary | ICD-10-CM | POA: Diagnosis not present

## 2018-07-05 DIAGNOSIS — N319 Neuromuscular dysfunction of bladder, unspecified: Secondary | ICD-10-CM | POA: Diagnosis not present

## 2018-07-05 DIAGNOSIS — L97421 Non-pressure chronic ulcer of left heel and midfoot limited to breakdown of skin: Secondary | ICD-10-CM | POA: Diagnosis not present

## 2018-07-05 DIAGNOSIS — I70203 Unspecified atherosclerosis of native arteries of extremities, bilateral legs: Secondary | ICD-10-CM | POA: Diagnosis not present

## 2018-07-05 DIAGNOSIS — E11621 Type 2 diabetes mellitus with foot ulcer: Secondary | ICD-10-CM | POA: Diagnosis not present

## 2018-07-05 DIAGNOSIS — L97411 Non-pressure chronic ulcer of right heel and midfoot limited to breakdown of skin: Secondary | ICD-10-CM | POA: Diagnosis not present

## 2018-07-08 DIAGNOSIS — L97421 Non-pressure chronic ulcer of left heel and midfoot limited to breakdown of skin: Secondary | ICD-10-CM | POA: Diagnosis not present

## 2018-07-08 DIAGNOSIS — N319 Neuromuscular dysfunction of bladder, unspecified: Secondary | ICD-10-CM | POA: Diagnosis not present

## 2018-07-08 DIAGNOSIS — E889 Metabolic disorder, unspecified: Secondary | ICD-10-CM | POA: Diagnosis not present

## 2018-07-08 DIAGNOSIS — M961 Postlaminectomy syndrome, not elsewhere classified: Secondary | ICD-10-CM | POA: Diagnosis not present

## 2018-07-08 DIAGNOSIS — I70203 Unspecified atherosclerosis of native arteries of extremities, bilateral legs: Secondary | ICD-10-CM | POA: Diagnosis not present

## 2018-07-08 DIAGNOSIS — E1151 Type 2 diabetes mellitus with diabetic peripheral angiopathy without gangrene: Secondary | ICD-10-CM | POA: Diagnosis not present

## 2018-07-08 DIAGNOSIS — E1140 Type 2 diabetes mellitus with diabetic neuropathy, unspecified: Secondary | ICD-10-CM | POA: Diagnosis not present

## 2018-07-08 DIAGNOSIS — L97411 Non-pressure chronic ulcer of right heel and midfoot limited to breakdown of skin: Secondary | ICD-10-CM | POA: Diagnosis not present

## 2018-07-08 DIAGNOSIS — E11621 Type 2 diabetes mellitus with foot ulcer: Secondary | ICD-10-CM | POA: Diagnosis not present

## 2018-07-08 DIAGNOSIS — I69369 Other paralytic syndrome following cerebral infarction affecting unspecified side: Secondary | ICD-10-CM | POA: Diagnosis not present

## 2018-07-09 DIAGNOSIS — L97421 Non-pressure chronic ulcer of left heel and midfoot limited to breakdown of skin: Secondary | ICD-10-CM | POA: Diagnosis not present

## 2018-07-09 DIAGNOSIS — R339 Retention of urine, unspecified: Secondary | ICD-10-CM | POA: Diagnosis not present

## 2018-07-09 DIAGNOSIS — L97411 Non-pressure chronic ulcer of right heel and midfoot limited to breakdown of skin: Secondary | ICD-10-CM | POA: Diagnosis not present

## 2018-07-09 DIAGNOSIS — I70203 Unspecified atherosclerosis of native arteries of extremities, bilateral legs: Secondary | ICD-10-CM | POA: Diagnosis not present

## 2018-07-09 DIAGNOSIS — N319 Neuromuscular dysfunction of bladder, unspecified: Secondary | ICD-10-CM | POA: Diagnosis not present

## 2018-07-09 DIAGNOSIS — E11621 Type 2 diabetes mellitus with foot ulcer: Secondary | ICD-10-CM | POA: Diagnosis not present

## 2018-07-09 DIAGNOSIS — E1151 Type 2 diabetes mellitus with diabetic peripheral angiopathy without gangrene: Secondary | ICD-10-CM | POA: Diagnosis not present

## 2018-07-10 ENCOUNTER — Encounter: Payer: Medicare Other | Attending: Internal Medicine | Admitting: Internal Medicine

## 2018-07-10 ENCOUNTER — Other Ambulatory Visit: Payer: Self-pay

## 2018-07-10 DIAGNOSIS — Z886 Allergy status to analgesic agent status: Secondary | ICD-10-CM | POA: Insufficient documentation

## 2018-07-10 DIAGNOSIS — E1151 Type 2 diabetes mellitus with diabetic peripheral angiopathy without gangrene: Secondary | ICD-10-CM | POA: Insufficient documentation

## 2018-07-10 DIAGNOSIS — Z955 Presence of coronary angioplasty implant and graft: Secondary | ICD-10-CM | POA: Diagnosis not present

## 2018-07-10 DIAGNOSIS — I252 Old myocardial infarction: Secondary | ICD-10-CM | POA: Diagnosis not present

## 2018-07-10 DIAGNOSIS — I110 Hypertensive heart disease with heart failure: Secondary | ICD-10-CM | POA: Insufficient documentation

## 2018-07-10 DIAGNOSIS — S91301A Unspecified open wound, right foot, initial encounter: Secondary | ICD-10-CM | POA: Diagnosis not present

## 2018-07-10 DIAGNOSIS — L97421 Non-pressure chronic ulcer of left heel and midfoot limited to breakdown of skin: Secondary | ICD-10-CM | POA: Diagnosis not present

## 2018-07-10 DIAGNOSIS — E1140 Type 2 diabetes mellitus with diabetic neuropathy, unspecified: Secondary | ICD-10-CM | POA: Diagnosis not present

## 2018-07-10 DIAGNOSIS — L97419 Non-pressure chronic ulcer of right heel and midfoot with unspecified severity: Secondary | ICD-10-CM | POA: Insufficient documentation

## 2018-07-10 DIAGNOSIS — I509 Heart failure, unspecified: Secondary | ICD-10-CM | POA: Diagnosis not present

## 2018-07-10 DIAGNOSIS — F17200 Nicotine dependence, unspecified, uncomplicated: Secondary | ICD-10-CM | POA: Diagnosis not present

## 2018-07-10 DIAGNOSIS — L97411 Non-pressure chronic ulcer of right heel and midfoot limited to breakdown of skin: Secondary | ICD-10-CM | POA: Diagnosis not present

## 2018-07-10 DIAGNOSIS — L97429 Non-pressure chronic ulcer of left heel and midfoot with unspecified severity: Secondary | ICD-10-CM | POA: Insufficient documentation

## 2018-07-10 DIAGNOSIS — N319 Neuromuscular dysfunction of bladder, unspecified: Secondary | ICD-10-CM | POA: Diagnosis not present

## 2018-07-10 DIAGNOSIS — Z794 Long term (current) use of insulin: Secondary | ICD-10-CM | POA: Insufficient documentation

## 2018-07-10 DIAGNOSIS — E11621 Type 2 diabetes mellitus with foot ulcer: Secondary | ICD-10-CM | POA: Diagnosis not present

## 2018-07-10 DIAGNOSIS — Z881 Allergy status to other antibiotic agents status: Secondary | ICD-10-CM | POA: Insufficient documentation

## 2018-07-10 DIAGNOSIS — I70203 Unspecified atherosclerosis of native arteries of extremities, bilateral legs: Secondary | ICD-10-CM | POA: Diagnosis not present

## 2018-07-10 NOTE — Progress Notes (Signed)
AYSEL, GILCHREST ([id number]) Visit Report for 07/10/2018 Abuse/Suicide Risk Screen Details Patient Name: Colleen Peters, Colleen Peters Date of Service: 07/10/2018 12:45 PM Medical Record Number: [id number] Patient Account Number: [id number] Date of Birth/Sex: 11/26/1960 (58 y.o. F) Treating RN: Montey Hora Primary Care Jancarlo Biermann: Cletis [place] Other Clinician: Referring Ginevra Tacker: Gardiner Barefoot Treating Nathaneil Feagans/Extender: Tito Dine in Treatment: 0 Abuse/Suicide Risk Screen Items Answer ABUSE RISK SCREEN: Has anyone close to you tried to hurt or harm you recentlyo No Do you feel uncomfortable with anyone in your familyo No Has anyone forced you do things that you didnot want to doo No Electronic Signature(s) Signed: 07/10/2018 3:06:33 PM By: Montey Hora Entered By: Montey Hora on 07/10/2018 13:02:11 [name] ([id number]) -------------------------------------------------------------------------------- Activities of Daily Living Details Patient Name: [name] Date of Service: 07/10/2018 12:45 PM Medical Record Number: [id number] Patient Account Number: [id number] Date of Birth/Sex: 05-Mar-1960 (58 y.o. F) Treating RN: Montey Hora Primary Care Maymunah Stegemann: Cletis [place] Other Clinician: Referring Latrese Carolan: Gardiner Barefoot Treating Roper Tolson/Extender: Tito Dine in Treatment: 0 Activities of Daily Living Items Answer Activities of Daily Living (Please select one for each item) Drive Automobile Not Able Take Medications Need Assistance Use Telephone Need Assistance Care for Appearance Need Assistance Use Toilet Completely Able Bath / Shower Need Assistance Dress Self Need Assistance Feed Self Completely Able Walk Need Assistance Get In / Out Bed Completely [place] Need Assistance Shop for Self Need Assistance Electronic Signature(s) Signed: 07/10/2018 3:06:33 PM By:  Montey Hora Entered By: Montey Hora on 07/10/2018 13:02:45 [name] ([id number]) -------------------------------------------------------------------------------- Education Screening Details Patient Name: [name] Date of Service: 07/10/2018 12:45 PM Medical Record Number: [id number] Patient Account Number: [id number] Date of Birth/Sex: 08-15-1960 (58 y.o. F) Treating RN: Montey Hora Primary Care Clotile Whittington: Cletis [place] Other Clinician: Referring Yazmin Locher: Gardiner Barefoot Treating Buna Cuppett/Extender: Tito Dine in Treatment: 0 Primary Learner Assessed: Patient Learning Preferences/Education Level/Primary Language Learning Preference: Explanation, Demonstration Highest Education Level: High School Preferred Language: English Cognitive Barrier Language Barrier: No Translator Needed: No Memory Deficit: No Emotional Barrier: No Cultural/Religious Beliefs Affecting Medical Care: No Physical Barrier Impaired Vision: Yes diabetic retinopathy Impaired Hearing: No Decreased Hand dexterity: No Knowledge/Comprehension Knowledge Level: Medium Comprehension Level: Medium Ability to understand written Medium instructions: Ability to understand verbal Medium instructions: Motivation Anxiety Level: Calm Cooperation: Cooperative Education Importance: Acknowledges Need Interest in Health Problems: Asks Questions Perception: Coherent Willingness to Engage in Self- Medium Management Activities: Readiness to Engage in Self- Medium Management Activities: Electronic Signature(s) Signed: 07/10/2018 3:06:33 PM By: Montey Hora Entered By: Montey Hora on 07/10/2018 13:03:22 [name] ([id number]) -------------------------------------------------------------------------------- Fall Risk Assessment Details Patient Name: [name] Date of Service: 07/10/2018 12:45 PM Medical Record Number: [id number] Patient Account Number:  [id number] Date of Birth/Sex: 03-31-1960 (58 y.o. F) Treating RN: Montey Hora Primary Care Glynn Freas: Cletis [place] Other Clinician: Referring Bryne Lindon: Gardiner Barefoot Treating Janice Bodine/Extender: Tito Dine in Treatment: 0 Fall Risk Assessment Items Have you had 2 or more falls in the last 12 monthso 0 No Have you had any fall that resulted in injury in the last 12 monthso 0 No FALLS RISK SCREEN History of falling - immediate or within 3 months 0 No Secondary diagnosis (Do you have 2 or more medical diagnoseso) 0 No Ambulatory aid None/bed rest/wheelchair/nurse 0 No Crutches/cane/walker 15 Yes Furniture 0 No Intravenous therapy Access/Saline/Heparin Lock 0 No Gait/Transferring Normal/ bed rest/ wheelchair 0  No Weak (short steps with or without shuffle, stooped but able to lift head while 10 Yes walking, may seek support from furniture) Impaired (short steps with shuffle, may have difficulty arising from chair, head 20 Yes down, impaired balance) Mental Status Oriented to own ability 0 Yes Electronic Signature(s) Signed: 07/10/2018 3:06:33 PM By: Montey Hora Entered By: Montey Hora on 07/10/2018 13:04:16 [name] ([id number]) -------------------------------------------------------------------------------- Foot Assessment Details Patient Name: [name] Date of Service: 07/10/2018 12:45 PM Medical Record Number: [id number] Patient Account Number: [id number] Date of Birth/Sex: [birth year] (58 y.o. F) Treating RN: Montey Hora Primary Care Jevonte Clanton: Cletis [place] Other Clinician: Referring Kiahna Banghart: Gardiner Barefoot Treating Eternity Dexter/Extender: Tito Dine in Treatment: 0 Foot Assessment Items Site Locations + = Sensation present, - = Sensation absent, C = Callus, U = Ulcer R = Redness, W = Warmth, M = Maceration, PU = Pre-ulcerative lesion F = Fissure, S = Swelling, D = Dryness Assessment Right: Left: Other Deformity: No  No Prior Foot Ulcer: No No Prior Amputation: No No Charcot Joint: No No Ambulatory Status: Ambulatory With Help Assistance Device: Walker Gait: Administrator, arts) Signed: 07/10/2018 3:06:33 PM By: Montey Hora Entered By: Montey Hora on 07/10/2018 13:06:13 [name] ([id number]) -------------------------------------------------------------------------------- Nutrition Risk Screening Details Patient Name: [name] Date of Service: 07/10/2018 12:45 PM Medical Record Number: [id number] Patient Account Number: [id number] Date of Birth/Sex: [birth year] (58 y.o. F) Treating RN: Montey Hora Primary Care Breyona Swander: Cletis [place] Other Clinician: Referring Dayani Winbush: Gardiner Barefoot Treating Terryl Niziolek/Extender: Tito Dine in Treatment: 0 Height (in): 66 Weight (lbs): 165 Body Mass Index (BMI): 26.6 Nutrition Risk Screening Items Score Screening NUTRITION RISK SCREEN: I have an illness or condition that made me change the kind and/or amount of 0 No food I eat I eat fewer than two meals per day 0 No I eat few fruits and vegetables, or milk products 0 No I have three or more drinks of beer, liquor or wine almost every day 0 No I have tooth or mouth problems that make it hard for me to eat 0 No I don't always have enough money to buy the food I need 0 No I eat alone most of the time 0 No I take three or more different prescribed or over-the-counter drugs a day 1 Yes Without wanting to, I have lost or gained 10 pounds in the last six months 0 No I am not always physically able to shop, cook and/or feed myself 2 Yes Nutrition Protocols Good Risk Protocol Moderate Risk Protocol High Risk Proctocol Risk Level: Moderate Risk Score: 3 Electronic Signature(s) Signed: 07/10/2018 3:06:33 PM By: Montey Hora Entered By: Montey Hora on 07/10/2018 13:04:35

## 2018-07-11 DIAGNOSIS — L97421 Non-pressure chronic ulcer of left heel and midfoot limited to breakdown of skin: Secondary | ICD-10-CM | POA: Diagnosis not present

## 2018-07-11 DIAGNOSIS — N319 Neuromuscular dysfunction of bladder, unspecified: Secondary | ICD-10-CM | POA: Diagnosis not present

## 2018-07-11 DIAGNOSIS — E1151 Type 2 diabetes mellitus with diabetic peripheral angiopathy without gangrene: Secondary | ICD-10-CM | POA: Diagnosis not present

## 2018-07-11 DIAGNOSIS — E11621 Type 2 diabetes mellitus with foot ulcer: Secondary | ICD-10-CM | POA: Diagnosis not present

## 2018-07-11 DIAGNOSIS — I70203 Unspecified atherosclerosis of native arteries of extremities, bilateral legs: Secondary | ICD-10-CM | POA: Diagnosis not present

## 2018-07-11 DIAGNOSIS — L97411 Non-pressure chronic ulcer of right heel and midfoot limited to breakdown of skin: Secondary | ICD-10-CM | POA: Diagnosis not present

## 2018-07-11 NOTE — Progress Notes (Addendum)
Colleen Peters, Colleen Peters ([id number]) Visit Report for 07/10/2018 Allergy List Details Patient Name: Colleen Peters, Colleen Peters. Date of Service: 07/10/2018 12:45 PM Medical Record Number: [id number] Patient Account Number: [id number] Date of Birth/Sex: Dec 22, 1960 (58 y.o. F) Treating RN: Montey Hora Primary Care Hagen Tidd: Cletis [place] Other Clinician: Referring Roarke Marciano: Gardiner Barefoot Treating Hannah Crill/Extender: Ricard Dillon Weeks in Treatment: 0 Allergies Active Allergies ibuprofen Bactrim Relafen Allergy Notes Electronic Signature(s) Signed: 07/10/2018 3:06:33 PM By: Montey Hora Entered By: Montey Hora on 07/10/2018 12:57:06 Colleen Peters ([id number]) -------------------------------------------------------------------------------- Arrival Information Details Patient Name: Colleen Peters Date of Service: 07/10/2018 12:45 PM Medical Record Number: [id number] Patient Account Number: [id number] Date of Birth/Sex: Mar 30, 1960 (58 y.o. F) Treating RN: Montey Hora Primary Care Isabeau Mccalla: Cletis [place] Other Clinician: Referring Zahirah Cheslock: Gardiner Barefoot Treating Levante Simones/Extender: Tito Dine in Treatment: 0 Visit Information Patient Arrived: Wheel Chair Arrival Time: 12:47 Accompanied By: husband Transfer Assistance: None Patient Identification Verified: Yes Secondary Verification Process Completed: Yes Patient Has Alerts: Yes Patient Alerts: DMII aspirin 81 Electronic Signature(s) Signed: 07/10/2018 3:06:33 PM By: Montey Hora Entered By: Montey Hora on 07/10/2018 12:48:29 Colleen Peters ([id number]) -------------------------------------------------------------------------------- Clinic Level of Care Assessment Details Patient Name: Colleen Peters Date of Service: 07/10/2018 12:45 PM Medical Record Number: [id number] Patient Account Number: [id number] Date of Birth/Sex: 1960/03/27 (58 y.o. F) Treating RN: Cornell Barman Primary Care  Hanad Leino: Cletis [place] Other Clinician: Referring Jasmaine Rochel: Gardiner Barefoot Treating Krystle Polcyn/Extender: Tito Dine in Treatment: 0 Clinic Level of Care Assessment Items TOOL 2 Quantity Score []  - Use when only an EandM is performed on the INITIAL visit 0 ASSESSMENTS - Nursing Assessment / Reassessment X - General Physical Exam (combine w/ comprehensive assessment (listed just below) when 1 20 performed on new pt. evals) X- 1 25 Comprehensive Assessment (HX, ROS, Risk Assessments, Wounds Hx, etc.) ASSESSMENTS - Wound and Skin Assessment / Reassessment []  - Simple Wound Assessment / Reassessment - one wound 0 X- 2 5 Complex Wound Assessment / Reassessment - multiple wounds []  - 0 Dermatologic / Skin Assessment (not related to wound area) ASSESSMENTS - Ostomy and/or Continence Assessment and Care []  - Incontinence Assessment and Management 0 []  - 0 Ostomy Care Assessment and Management (repouching, etc.) PROCESS - Coordination of Care X - Simple Patient / Family Education for ongoing care 1 15 []  - 0 Complex (extensive) Patient / Family Education for ongoing care X- 1 10 Staff obtains Programmer, systems, Records, Test Results / Process Orders []  - 0 Staff telephones HHA, Nursing Homes / Clarify orders / etc []  - 0 Routine Transfer to another Facility (non-emergent condition) []  - 0 Routine Hospital Admission (non-emergent condition) []  - 0 New Admissions / Biomedical engineer / Ordering NPWT, Apligraf, etc. X- 1 20 Emergency Hospital Admission (emergent condition) X- 1 10 Simple Discharge Coordination []  - 0 Complex (extensive) Discharge Coordination PROCESS - Special Needs []  - Pediatric / Minor Patient Management 0 []  - 0 Isolation Patient Management CHARISH, SCHROEPFER ([id number]) []  - 0 Hearing / Language / Visual special needs []  - 0 Assessment of Community assistance (transportation, D/C planning, etc.) []  - 0 Additional assistance / Altered  mentation []  - 0 Support Surface(s) Assessment (bed, cushion, seat, etc.) INTERVENTIONS - Wound Cleansing / Measurement X - Wound Imaging (photographs - any number of wounds) 1 5 []  - 0 Wound Tracing (instead of photographs) []  - 0 Simple Wound Measurement - one wound X- 2 5 Complex Wound Measurement - multiple wounds []  - 0 Simple Wound  Cleansing - one wound X- 2 5 Complex Wound Cleansing - multiple wounds INTERVENTIONS - Wound Dressings []  - Small Wound Dressing one or multiple wounds 0 X- 2 15 Medium Wound Dressing one or multiple wounds []  - 0 Large Wound Dressing one or multiple wounds []  - 0 Application of Medications - injection INTERVENTIONS - Miscellaneous []  - External ear exam 0 []  - 0 Specimen Collection (cultures, biopsies, blood, body fluids, etc.) []  - 0 Specimen(s) / Culture(s) sent or taken to Lab for analysis []  - 0 Patient Transfer (multiple staff / Civil Service fast streamer / Similar devices) []  - 0 Simple Staple / Suture removal (25 or less) []  - 0 Complex Staple / Suture removal (26 or more) []  - 0 Hypo / Hyperglycemic Management (close monitor of Blood Glucose) X- 1 15 Ankle / Brachial Index (ABI) - do not check if billed separately Has the patient been seen at the hospital within the last three years: Yes Total Score: 180 Level Of Care: New/Established - Level 5 Electronic Signature(s) Signed: 07/11/2018 10:03:11 AM By: Gretta Cool, BSN, RN, CWS, Kim RN, BSN Entered By: Gretta Cool, BSN, RN, CWS, Kim on 07/10/2018 13:44:20 Colleen Peters ([id number]) -------------------------------------------------------------------------------- Encounter Discharge Information Details Patient Name: Colleen Peters Date of Service: 07/10/2018 12:45 PM Medical Record Number: [id number] Patient Account Number: [id number] Date of Birth/Sex: 05-22-1960 (58 y.o. F) Treating RN: Harold Barban Primary Care Beuford Garcilazo: Cletis [place] Other Clinician: Referring Amorette Charrette: Gardiner Barefoot Treating Chanel Mckesson/Extender: Tito Dine in Treatment: 0 Encounter Discharge Information Items Discharge Condition: Stable Ambulatory Status: Wheelchair Discharge Destination: Home Transportation: Private Auto Accompanied By: husband Schedule Follow-up Appointment: Yes Clinical Summary of Care: Electronic Signature(s) Signed: 07/10/2018 2:11:14 PM By: Harold Barban Entered By: Harold Barban on 07/10/2018 14:11:14 Colleen Peters ([id number]) -------------------------------------------------------------------------------- Lower Extremity Assessment Details Patient Name: Colleen Peters Date of Service: 07/10/2018 12:45 PM Medical Record Number: [id number] Patient Account Number: [id number] Date of Birth/Sex: 1960/03/24 (58 y.o. F) Treating RN: Montey Hora Primary Care Montez Cuda: Cletis [place] Other Clinician: Referring Lalonnie Shaffer: Gardiner Barefoot Treating Karter Haire/Extender: Tito Dine in Treatment: 0 Edema Assessment Assessed: [Left: No] [Right: No] Edema: [Left: No] [Right: No] Calf Left: Right: Point of Measurement: 29 cm From Medial Instep 30.9 cm 30 cm Ankle Left: Right: Point of Measurement: 10 cm From Medial Instep 21.5 cm 22 cm Vascular Assessment Pulses: Dorsalis Pedis Palpable: [Left:No] [Right:No] Doppler Audible: [Left:Inaudible] [Right:Yes] Posterior Tibial Palpable: [Left:No] [Right:No] Doppler Audible: [Left:Yes] [Right:Yes] Blood Pressure: Brachial: [Left:104] [Right:104] Dorsalis Pedis: 52 Ankle: [Left:Dorsalis Pedis: 58] Posterior Tibial: 62 Ankle Brachial Index: [Left:0.56] [Right:0.60] Electronic Signature(s) Signed: 07/10/2018 3:06:33 PM By: Montey Hora Entered By: Montey Hora on 07/10/2018 13:21:59 Colleen Peters ([id number]) -------------------------------------------------------------------------------- Multi Wound Chart Details Patient Name: Colleen Peters Date of Service: 07/10/2018  12:45 PM Medical Record Number: [id number] Patient Account Number: [id number] Date of Birth/Sex: 17-Jun-1960 (58 y.o. F) Treating RN: Cornell Barman Primary Care Bjorn Hallas: Cletis [place] Other Clinician: Referring Pang Robers: Gardiner Barefoot Treating Avereigh Spainhower/Extender: Tito Dine in Treatment: 0 Vital Signs Height(in): 66 Pulse(bpm): 67 Weight(lbs): 165 Blood Pressure(mmHg): 104/47 Body Mass Index(BMI): 27 Temperature(F): 97.8 Respiratory Rate 16 (breaths/min): Photos: [N/A:N/A] Wound Location: Right Calcaneus Left Calcaneus N/A Wounding Event: Gradually Appeared Gradually Appeared N/A Primary Etiology: Diabetic Wound/Ulcer of the Diabetic Wound/Ulcer of the N/A Lower Extremity Lower Extremity Comorbid History: Congestive Heart Failure, Congestive Heart Failure, N/A Hypertension, Myocardial Hypertension, Myocardial Infarction, Type II Diabetes, Infarction, Type II Diabetes, Neuropathy Neuropathy Date Acquired: 03/30/2018 02/27/2018 N/A Colleen Peters  of Treatment: 0 0 N/A Wound Status: Open Open N/A Measurements L x W x D 0.5x1.6x0.2 4.6x4.4x0.1 N/A (cm) Area (cm) : 0.628 15.896 N/A Volume (cm) : 0.126 1.59 N/A Classification: Grade 2 Grade 2 N/A Exudate Amount: None Present Small N/A Exudate Type: N/A Sanguinous N/A Exudate Color: N/A red N/A Wound Margin: Indistinct, nonvisible Indistinct, nonvisible N/A Granulation Amount: None Present (0%) None Present (0%) N/A Necrotic Amount: Large (67-100%) Large (67-100%) N/A Necrotic Tissue: Eschar N/A N/A Exposed Structures: Fascia: No Fascia: No N/A Fat Layer (Subcutaneous Fat Layer (Subcutaneous Tissue) Exposed: No Tissue) Exposed: No Tendon: No Tendon: No Muscle: No Muscle: No Joint: No Joint: No Colleen Peters, Colleen G. ([id number]) Bone: No Bone: No Limited to Skin Breakdown Limited to Skin Breakdown Epithelialization: None None N/A Treatment Notes Electronic Signature(s) Signed: 07/10/2018 4:30:12 PM By: Linton Ham MD Entered By: Linton Ham on 07/10/2018 13:53:01 Colleen Peters ([id number]) -------------------------------------------------------------------------------- Multi-Disciplinary Care Plan Details Patient Name: Colleen Peters Date of Service: 07/10/2018 12:45 PM Medical Record Number: [id number] Patient Account Number: [id number] Date of Birth/Sex: 12-04-1960 (58 y.o. F) Treating RN: Cornell Barman Primary Care Delmar Arriaga: Cletis [place] Other Clinician: Referring Amoy Steeves: Gardiner Barefoot Treating Trafton Roker/Extender: Tito Dine in Treatment: 0 Active Inactive Electronic Signature(s) Signed: 08/28/2018 6:28:36 PM By: Gretta Cool, BSN, RN, CWS, Kim RN, BSN Previous Signature: 07/11/2018 10:03:11 AM Version By: Gretta Cool, BSN, RN, CWS, Kim RN, BSN Entered By: Gretta Cool, BSN, RN, CWS, Kim on 08/28/2018 18:28:36 Colleen Peters ([id number]) -------------------------------------------------------------------------------- Pain Assessment Details Patient Name: Colleen Peters Date of Service: 07/10/2018 12:45 PM Medical Record Number: [id number] Patient Account Number: [id number] Date of Birth/Sex: June 22, 1960 (58 y.o. F) Treating RN: Montey Hora Primary Care Majesty Oehlert: Cletis [place] Other Clinician: Referring Rivers Gassmann: Gardiner Barefoot Treating Ashton Belote/Extender: Tito Dine in Treatment: 0 Active Problems Location of Pain Severity and Description of Pain Patient Has Paino No Site Locations Pain Management and Medication Current Pain Management: Electronic Signature(s) Signed: 07/10/2018 3:06:33 PM By: Montey Hora Entered By: Montey Hora on 07/10/2018 12:52:11 Colleen Peters ([id number]) -------------------------------------------------------------------------------- Wound Assessment Details Patient Name: Colleen Peters Date of Service: 07/10/2018 12:45 PM Medical Record Number: [id number] Patient Account Number: [id number] Date of  Birth/Sex: 05/04/1960 (59 y.o. F) Treating RN: Montey Hora Primary Care Emberlynn Riggan: Cletis [place] Other Clinician: Referring Adwoa Axe: Gardiner Barefoot Treating Cecilia Vancleve/Extender: Tito Dine in Treatment: 0 Wound Status Wound Number: 1 Primary Diabetic Wound/Ulcer of the Lower Extremity Etiology: Wound Location: Right Calcaneus Wound Open Wounding Event: Gradually Appeared Status: Date Acquired: 03/30/2018 Comorbid Congestive Heart Failure, Hypertension, Weeks Of Treatment: 0 History: Myocardial Infarction, Type II Diabetes, Clustered Wound: No Neuropathy Photos Wound Measurements Length: (cm) 0.5 Width: (cm) 1.6 Depth: (cm) 0.2 Area: (cm) 0.628 Volume: (cm) 0.126 % Reduction in Area: % Reduction in Volume: Epithelialization: None Tunneling: No Undermining: No Wound Description Classification: Grade 2 Wound Margin: Indistinct, nonvisible Exudate Amount: None Present Foul Odor After Cleansing: No Slough/Fibrino Yes Wound Bed Granulation Amount: None Present (0%) Exposed Structure Necrotic Amount: Large (67-100%) Fascia Exposed: No Necrotic Quality: Eschar Fat Layer (Subcutaneous Tissue) Exposed: No Tendon Exposed: No Muscle Exposed: No Joint Exposed: No Bone Exposed: No Limited to Skin Breakdown Periwound Skin Texture Texture Color No Abnormalities Noted: No No Abnormalities Noted: No Colleen Peters, Colleen G. ([id number]) Callus: Yes Temperature / Pain Tenderness on Palpation: Yes Moisture No Abnormalities Noted: No Electronic Signature(s) Signed: 07/10/2018 3:06:33 PM By: Montey Hora Entered By: Montey Hora on 07/10/2018 13:15:21 Colleen Peters ([id number]) -------------------------------------------------------------------------------- Wound  Assessment Details Patient Name: Colleen Peters, Colleen Peters. Date of Service: 07/10/2018 12:45 PM Medical Record Number: [id number] Patient Account Number: [id number] Date of Birth/Sex: [birth year] (58  y.o. F) Treating RN: Montey Hora Primary Care Dal Blew: Cletis [place] Other Clinician: Referring Fumi Guadron: Gardiner Barefoot Treating Jemeka Wagler/Extender: Tito Dine in Treatment: 0 Wound Status Wound Number: 2 Primary Diabetic Wound/Ulcer of the Lower Extremity Etiology: Wound Location: Left Calcaneus Wound Open Wounding Event: Gradually Appeared Status: Date Acquired: 02/27/2018 Comorbid Congestive Heart Failure, Hypertension, Weeks Of Treatment: 0 History: Myocardial Infarction, Type II Diabetes, Clustered Wound: No Neuropathy Photos Wound Measurements Length: (cm) 4.6 % Reduction i Width: (cm) 4.4 % Reduction i Depth: (cm) 0.1 Epithelializa Area: (cm) 15.896 Tunneling: Volume: (cm) 1.59 Undermining: n Area: n Volume: tion: None No No Wound Description Classification: Grade 2 Foul Odor Aft Wound Margin: Indistinct, nonvisible Slough/Fibrin Exudate Amount: Small Exudate Type: Sanguinous Exudate Color: red er Cleansing: No o Yes Wound Bed Granulation Amount: None Present (0%) Exposed Structure Necrotic Amount: Large (67-100%) Fascia Exposed: No Fat Layer (Subcutaneous Tissue) Exposed: No Tendon Exposed: No Muscle Exposed: No Joint Exposed: No Bone Exposed: No Limited to Skin Breakdown Periwound Skin Texture Colleen Peters, Colleen G. ([id number]) Texture Color No Abnormalities Noted: No No Abnormalities Noted: No Callus: Yes Temperature / Pain Moisture Tenderness on Palpation: Yes No Abnormalities Noted: No Electronic Signature(s) Signed: 07/10/2018 3:06:33 PM By: Montey Hora Entered By: Montey Hora on 07/10/2018 13:17:17 Colleen Peters ([id number]) -------------------------------------------------------------------------------- Vitals Details Patient Name: Colleen Peters Date of Service: 07/10/2018 12:45 PM Medical Record Number: [id number] Patient Account Number: [id number] Date of Birth/Sex: 1960/12/04 (58 y.o. F) Treating RN:  Montey Hora Primary Care Macaiah Mangal: Cletis [place] Other Clinician: Referring Amariz Flamenco: Gardiner Barefoot Treating Anquinette Pierro/Extender: Tito Dine in Treatment: 0 Vital Signs Time Taken: 12:53 Temperature (F): 97.8 Height (in): 66 Pulse (bpm): 84 Source: Measured Respiratory Rate (breaths/min): 16 Weight (lbs): 165 Blood Pressure (mmHg): 104/47 Source: Measured Reference Range: 80 - 120 mg / dl Body Mass Index (BMI): 26.6 Electronic Signature(s) Signed: 07/10/2018 3:06:33 PM By: Montey Hora Entered By: Montey Hora on 07/10/2018 12:56:08

## 2018-07-11 NOTE — Progress Notes (Signed)
ROWENE, SUTO ([id number]) Visit Report for 07/10/2018 Chief Complaint Document Details Patient Name: Colleen, Peters. Date of Service: 07/10/2018 12:45 PM Medical Record Number: [id number] Patient Account Number: [id number] Date of Birth/Sex: 03/21/1960 (58 y.o. F) Treating RN: Cornell Barman Primary Care Provider: Cletis Athens Other Clinician: Referring Provider: Gardiner Barefoot Treating Provider/Extender: Tito Dine in Treatment: 0 Information Obtained from: Patient Chief Complaint 07/10/2018; patient is here for review of wounds on her bilateral heels Electronic Signature(s) Signed: 07/10/2018 4:30:12 PM By: Linton Ham MD Entered By: Linton Ham on 07/10/2018 13:54:52 Maglione, Rolm Baptise ([id number]) -------------------------------------------------------------------------------- HPI Details Patient Name: Colleen Peters Date of Service: 07/10/2018 12:45 PM Medical Record Number: [id number] Patient Account Number: [id number] Date of Birth/Sex: February 01, 1961 (58 y.o. F) Treating RN: Cornell Barman Primary Care Provider: Cletis Athens Other Clinician: Referring Provider: Gardiner Barefoot Treating Provider/Extender: Tito Dine in Treatment: 0 History of Present Illness HPI Description: ADMISSION 07/10/2018 This is a 58 year old woman who is a type II diabetic and a continued smoker although she is making efforts to cut down. She was supposed to come here last month however she ended up getting admitted to the hospital at [facility] from 06/28/2018 through 07/02/2018. This was for review of left foot diabetic ulcers. These were felt to be ischemic. She underwent a CTA of the abdomen pelvis with runoff showing severely calcified and noncalcified atherosclerotic disease seen throughout the aorta and inflow/outflow vessels of the lower extremities. Below the groin she had occlusion of the right popliteal with distal reconstitution and severe narrowing of the left  superficial femoral artery. She had recently undergone a PCI with DES in December 2009 after suffering a myocardial infarction. They are therefore waiting the required 6 months until they would consider her for revascularization. She was treated with IV antibiotics out of concern for infection. The patient is using Betadine to her wounds currently Past medical history; type 2 diabetes, continued tobacco abuse, hypertension, congestive heart failure with an ejection fraction of 35%, urinary retention she has a bladder stimulator that currently is not operational and coronary artery disease. I am not currently sure of her diabetes control. She is on insulin and metformin ABIs in our clinic were 0.6 on the right and 0.56 on the left. Notable that studies done during her hospitalization showed a toe pressure of 0 on the right and 34 on the left Electronic Signature(s) Signed: 07/10/2018 4:30:12 PM By: Linton Ham MD Entered By: Linton Ham on 07/10/2018 14:00:17 Colleen Peters ([id number]) -------------------------------------------------------------------------------- Physical Exam Details Patient Name: Colleen Peters Date of Service: 07/10/2018 12:45 PM Medical Record Number: [id number] Patient Account Number: [id number] Date of Birth/Sex: 13-Oct-1960 (58 y.o. F) Treating RN: Cornell Barman Primary Care Provider: Cletis Athens Other Clinician: Referring Provider: Gardiner Barefoot Treating Provider/Extender: Ricard Dillon Weeks in Treatment: 0 Constitutional Sitting or standing Blood Pressure is within target range for patient.. Pulse regular and within target range for patient.Marland Kitchen Respirations regular, non-labored and within target range.. Temperature is normal and within the target range for the patient.Marland Kitchen appears in no distress. Eyes Conjunctivae clear. No discharge. Respiratory Respiratory effort is easy and symmetric bilaterally. Rate is normal at rest and on room air..  Decreased air entry bilaterally no wheezing. Cardiovascular Faintly found on the left femoral I could not feel the right same at the popliteal. Pedal pulses absent bilaterally.. Gastrointestinal (GI) Abdomen is soft and non-distended without masses or tenderness. Bowel sounds active in all quadrants.. Lymphatic None palpable in the popliteal  or inguinal area. Integumentary (Hair, Skin) No erythema around the wounds. Psychiatric No evidence of depression, anxiety, or agitation. Calm, cooperative, and communicative. Appropriate interactions and affect.. Notes Wound exam; major areas on the left medial heel large black surface eschar over the wound. This may have a slight separation at this point but is well away from separating. oOn the right she has a smaller wound but again with the same tightly adherent black eschar. Electronic Signature(s) Signed: 07/10/2018 4:30:12 PM By: Linton Ham MD Entered By: Linton Ham on 07/10/2018 14:02:17 Colleen Peters ([id number]) -------------------------------------------------------------------------------- Physician Orders Details Patient Name: Colleen Peters Date of Service: 07/10/2018 12:45 PM Medical Record Number: [id number] Patient Account Number: [id number] Date of Birth/Sex: Jul 08, 1960 (58 y.o. F) Treating RN: Cornell Barman Primary Care Provider: Cletis Athens Other Clinician: Referring Provider: Gardiner Barefoot Treating Provider/Extender: Tito Dine in Treatment: 0 Verbal / Phone Orders: No Diagnosis Coding Wound Cleansing Wound #1 Right Calcaneus o Cleanse wound with mild soap and water Wound #2 Left Calcaneus o Cleanse wound with mild soap and water Skin Barriers/Peri-Wound Care Wound #1 Right Calcaneus o Other: - Betadine paint daily Wound #2 Left Calcaneus o Other: - Betadine paint daily Primary Wound Dressing Wound #1 Right Calcaneus o ABD Pad Wound #2 Left Calcaneus o ABD  Pad Secondary Dressing Wound #1 Right Calcaneus o Conform/Kerlix Wound #2 Left Calcaneus o Conform/Kerlix Dressing Change Frequency Wound #1 Right Calcaneus o Change dressing every day. Wound #2 Left Calcaneus o Change dressing every day. Follow-up Appointments Wound #1 Right Calcaneus o Return Appointment in 2 weeks. Wound #2 Left Calcaneus o Return Appointment in 2 weeks. Edema Control ANGELIS, GATES ([id number]) Wound #1 Right Calcaneus o Elevate legs to the level of the heart and pump ankles as often as possible Wound #2 Left Calcaneus o Elevate legs to the level of the heart and pump ankles as often as possible Off-Loading Wound #1 Right Calcaneus o Open toe surgical shoe to: Wound #2 Left Calcaneus o Open toe surgical shoe to: Additional Orders / Instructions Wound #1 Right Calcaneus o Stop Smoking Wound #2 Left Calcaneus o Stop Smoking Home Health Wound #1 Right Calcaneus o Continue Home Health Visits - [place] Nurse may visit PRN to address patientos wound care needs. o FACE TO FACE ENCOUNTER: MEDICARE and MEDICAID PATIENTS: I certify that this patient is under my care and that I had a face-to-face encounter that meets the physician face-to-face encounter requirements with this patient on this date. The encounter with the patient was in whole or in part for the following MEDICAL CONDITION: (primary reason for [place]) MEDICAL NECESSITY: I certify, that based on my findings, NURSING services are a medically necessary home health service. HOME BOUND STATUS: I certify that my clinical findings support that this patient is homebound (i.e., Due to illness or injury, pt requires aid of supportive devices such as crutches, cane, wheelchairs, walkers, the use of special transportation or the assistance of another person to leave their place of residence. There is a normal inability to leave the home and doing so  requires considerable and taxing effort. Other absences are for medical reasons / religious services and are infrequent or of short duration when for other reasons). o If current dressing causes regression in wound condition, may D/C ordered dressing product/s and apply Normal Saline Moist Dressing daily until next [place] / Other MD appointment. [place] of regression in wound condition at [phone]. o  Please direct any NON-WOUND related issues/requests for orders to patient's Primary Care Physician Wound #2 Left [place] Visits - [place] Nurse may visit PRN to address patientos wound care needs. o FACE TO FACE ENCOUNTER: MEDICARE and MEDICAID PATIENTS: I certify that this patient is under my care and that I had a face-to-face encounter that meets the physician face-to-face encounter requirements with this patient on this date. The encounter with the patient was in whole or in part for the following MEDICAL CONDITION: (primary reason for [place]) MEDICAL NECESSITY: I certify, that based on my findings, NURSING services are a medically necessary home health service. HOME BOUND STATUS: I certify that my clinical findings support that this patient is homebound (i.e., Due to illness or injury, pt requires aid of supportive devices such as crutches, cane, wheelchairs, walkers, the use of special transportation or the assistance of another person to leave their place of residence. There is a normal inability to leave the home and doing so requires considerable and taxing effort. Other absences are for medical reasons / religious services and are infrequent or of short duration when for other reasons). o If current dressing causes regression in wound condition, may D/C ordered dressing product/s and apply Normal Saline Moist Dressing daily until next [place] / Other MD appointment. [place] of regression in wound condition at [phone]. o Please direct any NON-WOUND related issues/requests for orders to patient's Primary Care Physician RYENN, HOWETH ([id number]) Electronic Signature(s) Signed: 07/10/2018 4:30:12 PM By: Linton Ham MD Signed: 07/11/2018 10:03:11 AM By: Gretta Cool, BSN, RN, CWS, Kim RN, BSN Entered By: Gretta Cool, BSN, RN, CWS, Kim on 07/10/2018 13:43:36 Colleen Peters ([id number]) -------------------------------------------------------------------------------- Problem List Details Patient Name: Colleen Peters Date of Service: 07/10/2018 12:45 PM Medical Record Number: [id number] Patient Account Number: [id number] Date of Birth/Sex: 03-05-1960 (58 y.o. 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 with other 07/10/2018 No Yes specified severity L97.418 Non-pressure chronic ulcer of right heel and midfoot with 07/10/2018 No Yes other specified severity Inactive Problems Resolved Problems Electronic Signature(s) Signed: 07/10/2018 4:30:12 PM By: Linton Ham MD Entered By: Linton Ham on 07/10/2018 13:52:51 Colleen Peters ([id number]) -------------------------------------------------------------------------------- Progress Note Details Patient Name: Colleen Peters Date of Service: 07/10/2018 12:45 PM Medical Record Number: [id number] Patient Account Number: [id number] Date of Birth/Sex: 13-Oct-1960 (58 y.o. F) Treating RN: Cornell Barman Primary Care Provider:  Cletis Athens Other Clinician: Referring Provider: Gardiner Barefoot Treating Provider/Extender: Tito Dine in Treatment: 0 Subjective Chief Complaint Information obtained from Patient 07/10/2018; patient is here for review of wounds on her bilateral heels History of Present Illness (HPI) ADMISSION 07/10/2018 This is a 58 year old woman who is a type II diabetic and a continued smoker although she is making efforts to cut down. She was supposed to come here last month however she ended up getting admitted to the hospital at [facility] from 06/28/2018 through 07/02/2018. This was for review of left foot diabetic ulcers. These were felt to be ischemic. She underwent a CTA of the abdomen pelvis with runoff showing severely calcified and  noncalcified atherosclerotic disease seen throughout the aorta and inflow/outflow vessels of the lower extremities. Below the groin she had occlusion of the right popliteal with distal reconstitution and severe narrowing of the left superficial femoral artery. She had recently undergone a PCI with DES in December 2009 after suffering a myocardial infarction. They are therefore waiting the required 6 months until they would consider her for revascularization. She was treated with IV antibiotics out of concern for infection. The patient is using Betadine to her wounds currently Past medical history; type 2 diabetes, continued tobacco abuse, hypertension, congestive heart failure with an ejection fraction of 35%, urinary retention she has a bladder stimulator that currently is not operational and coronary artery disease. I am not currently sure of her diabetes control. She is on insulin and metformin ABIs in our clinic were 0.6 on the right and 0.56 on the left. Notable that studies done during her hospitalization showed a toe pressure of 0 on the right and 34 on the left Patient History Information obtained from Patient. Allergies ibuprofen, Bactrim,  Relafen Family History Cancer - Father, Diabetes - Mother,Maternal Grandparents, No family history of Heart Disease, Hereditary Spherocytosis, Hypertension, Kidney Disease, Lung Disease, Seizures, Stroke, Thyroid Problems, Tuberculosis. Social History Current every day smoker, Marital Status - Married, Alcohol Use - Never, Drug Use - No History, Caffeine Use - Moderate. Medical History Eyes Denies history of Cataracts, Glaucoma, Optic Neuritis Cardiovascular Patient has history of Congestive Heart Failure, Hypertension, Myocardial Infarction - December 2019 TONETTE, KOEHNE ([id number]) Denies history of Angina, Arrhythmia, Coronary Artery Disease, Deep Vein Thrombosis, Hypotension, Peripheral Arterial Disease, Peripheral Venous Disease, Phlebitis, Vasculitis Endocrine Patient has history of Type II Diabetes Denies history of Type I Diabetes Genitourinary Denies history of End Stage Renal Disease Integumentary (Skin) Denies history of History of Burn, History of pressure wounds Neurologic Patient has history of Neuropathy Denies history of Dementia, Quadriplegia, Paraplegia, Seizure Disorder Patient is treated with Oral Agents. Medical And Surgical History Notes Eyes diabetic neuropathy Review of Systems (ROS) Constitutional Symptoms (General Health) Denies complaints or symptoms of Fatigue, Fever, Chills, Marked Weight Change. Eyes Denies complaints or symptoms of Dry Eyes, Vision Changes, Glasses / Contacts. Ear/Nose/Mouth/Throat Denies complaints or symptoms of Difficult clearing ears, Sinusitis. Hematologic/Lymphatic Denies complaints or symptoms of Bleeding / Clotting Disorders, Human Immunodeficiency Virus. Respiratory Denies complaints or symptoms of Chronic or frequent coughs, Shortness of Breath. Cardiovascular Denies complaints or symptoms of Chest pain, LE edema. Gastrointestinal Denies complaints or symptoms of Frequent diarrhea, Nausea,  Vomiting. Endocrine Denies complaints or symptoms of Hepatitis, Thyroid disease, Polydypsia (Excessive Thirst). Genitourinary Complains or has symptoms of Incontinence/dribbling. Denies complaints or symptoms of Kidney failure/ Dialysis. Immunological Denies complaints or symptoms of Hives, Itching. Integumentary (Skin) Complains or has symptoms of Wounds. Denies complaints or symptoms of Bleeding or bruising tendency, Breakdown, Swelling. Musculoskeletal Denies complaints or symptoms of Muscle Pain, Muscle Weakness. Neurologic Denies complaints or symptoms of Numbness/parasthesias, Focal/Weakness. Psychiatric Denies complaints or symptoms of Anxiety, Claustrophobia. MARKITA, STCHARLES ([id number]) Objective Constitutional Sitting or standing Blood Pressure is within target range for patient.. Pulse regular and within target range for patient.Marland Kitchen Respirations regular, non-labored and within target range.. Temperature is normal and within the target range for the patient.Marland Kitchen appears in no distress. Vitals Time Taken: 12:53 PM, Height: 66 in, Source: Measured, Weight: 165 lbs, Source: Measured, BMI: 26.6, Temperature: 97.8 F, Pulse: 84 bpm, Respiratory Rate: 16 breaths/min, Blood Pressure: 104/47 mmHg. Eyes Conjunctivae clear. No discharge. Respiratory Respiratory effort is easy  and symmetric bilaterally. Rate is normal at rest and on room air.. Decreased air entry bilaterally no wheezing. Cardiovascular Faintly found on the left femoral I could not feel the right same at the popliteal. Pedal pulses absent bilaterally.. Gastrointestinal (GI) Abdomen is soft and non-distended without masses or tenderness. Bowel sounds active in all quadrants.. Lymphatic None palpable in the popliteal or inguinal area. Psychiatric No evidence of depression, anxiety, or agitation. Calm, cooperative, and communicative. Appropriate interactions and affect.. General Notes: Wound exam; major areas on the  left medial heel large black surface eschar over the wound. This may have a slight separation at this point but is well away from separating. On the right she has a smaller wound but again with the same tightly adherent black eschar. Integumentary (Hair, Skin) No erythema around the wounds. Wound #1 status is Open. Original cause of wound was Gradually Appeared. The wound is located on the Right Calcaneus. The wound measures 0.5cm length x 1.6cm width x 0.2cm depth; 0.628cm^2 area and 0.126cm^3 volume. The wound is limited to skin breakdown. There is no tunneling or undermining noted. There is a none present amount of drainage noted. The wound margin is indistinct and nonvisible. There is no granulation within the wound bed. There is a large (67-100%) amount of necrotic tissue within the wound bed including Eschar. The periwound skin appearance exhibited: Callus. The periwound has tenderness on palpation. Wound #2 status is Open. Original cause of wound was Gradually Appeared. The wound is located on the Left Calcaneus. The wound measures 4.6cm length x 4.4cm width x 0.1cm depth; 15.896cm^2 area and 1.59cm^3 volume. The wound is limited to skin breakdown. There is no tunneling or undermining noted. There is a small amount of sanguinous drainage noted. The wound margin is indistinct and nonvisible. There is no granulation within the wound bed. There is a large (67- 100%) amount of necrotic tissue within the wound bed. The periwound skin appearance exhibited: Callus. The periwound has tenderness on palpation. Assessment SCHWANDA, ZIMA ([id number]) Active Problems ICD-10 Type 2 diabetes mellitus with foot ulcer Type 2 diabetes mellitus with diabetic peripheral angiopathy without gangrene Non-pressure chronic ulcer of left heel and midfoot with other specified severity Non-pressure chronic ulcer of right heel and midfoot with other specified severity Plan Wound Cleansing: Wound #1 Right  Calcaneus: Cleanse wound with mild soap and water Wound #2 Left Calcaneus: Cleanse wound with mild soap and water Skin Barriers/Peri-Wound Care: Wound #1 Right Calcaneus: Other: - Betadine paint daily Wound #2 Left Calcaneus: Other: - Betadine paint daily Primary Wound Dressing: Wound #1 Right Calcaneus: ABD Pad Wound #2 Left Calcaneus: ABD Pad Secondary Dressing: Wound #1 Right Calcaneus: Conform/Kerlix Wound #2 Left Calcaneus: Conform/Kerlix Dressing Change Frequency: Wound #1 Right Calcaneus: Change dressing every day. Wound #2 Left Calcaneus: Change dressing every day. Follow-up Appointments: Wound #1 Right Calcaneus: Return Appointment in 2 weeks. Wound #2 Left Calcaneus: Return Appointment in 2 weeks. Edema Control: Wound #1 Right Calcaneus: Elevate legs to the level of the heart and pump ankles as often as possible Wound #2 Left Calcaneus: Elevate legs to the level of the heart and pump ankles as often as possible Off-Loading: Wound #1 Right Calcaneus: Open toe surgical shoe to: Wound #2 Left Calcaneus: Open toe surgical shoe to: Additional Orders / Instructions: Wound #1 Right Calcaneus: LASHONDA, SONNEBORN. ([id number]) Stop Smoking Wound #2 Left Calcaneus: Stop Smoking Home Health: Wound #1 Right Calcaneus: Continue Home Health Visits - [place] Nurse may visit PRN  to address patient s wound care needs. FACE TO FACE ENCOUNTER: MEDICARE and MEDICAID PATIENTS: I certify that this patient is under my care and that I had a face-to-face encounter that meets the physician face-to-face encounter requirements with this patient on this date. The encounter with the patient was in whole or in part for the following MEDICAL CONDITION: (primary reason for [place]) MEDICAL NECESSITY: I certify, that based on my findings, NURSING services are a medically necessary home health service. HOME BOUND STATUS: I certify that my clinical findings support  that this patient is homebound (i.e., Due to illness or injury, pt requires aid of supportive devices such as crutches, cane, wheelchairs, walkers, the use of special transportation or the assistance of another person to leave their place of residence. There is a normal inability to leave the home and doing so requires considerable and taxing effort. Other absences are for medical reasons / religious services and are infrequent or of short duration when for other reasons). If current dressing causes regression in wound condition, may D/C ordered dressing product/s and apply Normal Saline Moist Dressing daily until next [place] / Other MD appointment. [place] of regression in wound condition at [phone]. Please direct any NON-WOUND related issues/requests for orders to patient's Primary Care Physician Wound #2 Left Calcaneus: [place] Nurse may visit PRN to address patient s wound care needs. FACE TO FACE ENCOUNTER: MEDICARE and MEDICAID PATIENTS: I certify that this patient is under my care and that I had a face-to-face encounter that meets the physician face-to-face encounter requirements with this patient on this date. The encounter with the patient was in whole or in part for the following MEDICAL CONDITION: (primary reason for [place]) MEDICAL NECESSITY: I certify, that based on my findings, NURSING services are a medically necessary home health service. HOME BOUND STATUS: I certify that my clinical findings support that this patient is homebound (i.e., Due to illness or injury, pt requires aid of supportive devices such as crutches, cane, wheelchairs, walkers, the use of special transportation or the assistance of another person to leave their place of residence. There is a normal inability to leave the home and doing so requires considerable and taxing effort. Other absences are for medical reasons /  religious services and are infrequent or of short duration when for other reasons). If current dressing causes regression in wound condition, may D/C ordered dressing product/s and apply Normal Saline Moist Dressing daily until next [place] / Other MD appointment. [place] of regression in wound condition at [phone]. Please direct any NON-WOUND related issues/requests for orders to patient's Primary Care Physician 1. I agree with the Betadine they are wrapping this with kerlix 2. She has offloading shoes high do not think she is not mobile 3. I have encouraged her to stop smoking 4. No doubt this patient is going to require a series of revascularization stockings starting with her inflow tract disease. She also has involvement of the left SFA in the right popliteal which will need to be addressed subsequently. 5. I see no evidence currently of infection. I do not see imaging studies at the moment that were done at [facility] I will try to see if something was done. She did receive antibiotics 6. Follow-up in 2 weeks Electronic Signature(s) Signed: 07/10/2018 4:30:12 PM By: Linton Ham MD Entered By: Linton Ham on 07/10/2018 14:04:18 Colleen Peters ([id number]) -------------------------------------------------------------------------------- ROS/PFSH Details  Patient Name: PAISLEY, GRAJEDA. Date of Service: 07/10/2018 12:45 PM Medical Record Number: [id number] Patient Account Number: [id number] Date of Birth/Sex: 1960-07-04 (59 y.o. F) Treating RN: Montey Hora Primary Care Provider: Cletis Athens Other Clinician: Referring Provider: Gardiner Barefoot Treating Provider/Extender: Tito Dine in Treatment: 0 Information Obtained From Patient Constitutional Symptoms (General Health) Complaints and Symptoms: Negative for: Fatigue; Fever; Chills; Marked Weight Change Eyes Complaints and Symptoms: Negative for: Dry Eyes; Vision Changes;  Glasses / Contacts Medical History: Negative for: Cataracts; Glaucoma; Optic Neuritis Past Medical History Notes: diabetic neuropathy Ear/Nose/Mouth/Throat Complaints and Symptoms: Negative for: Difficult clearing ears; Sinusitis Hematologic/Lymphatic Complaints and Symptoms: Negative for: Bleeding / Clotting Disorders; Human Immunodeficiency Virus Respiratory Complaints and Symptoms: Negative for: Chronic or frequent coughs; Shortness of Breath Cardiovascular Complaints and Symptoms: Negative for: Chest pain; LE edema Medical History: Positive for: Congestive Heart Failure; Hypertension; Myocardial Infarction - December 2019 Negative for: Angina; Arrhythmia; Coronary Artery Disease; Deep Vein Thrombosis; Hypotension; Peripheral Arterial Disease; Peripheral Venous Disease; Phlebitis; Vasculitis Gastrointestinal Complaints and Symptoms: Negative for: Frequent diarrhea; Nausea; Vomiting Endocrine TIFFANI, KADOW. ([id number]) Complaints and Symptoms: Negative for: Hepatitis; Thyroid disease; Polydypsia (Excessive Thirst) Medical History: Positive for: Type II Diabetes Negative for: Type I Diabetes Time with diabetes: 65 years Treated with: Oral agents Genitourinary Complaints and Symptoms: Positive for: Incontinence/dribbling Negative for: Kidney failure/ Dialysis Medical History: Negative for: End Stage Renal Disease Immunological Complaints and Symptoms: Negative for: Hives; Itching Integumentary (Skin) Complaints and Symptoms: Positive for: Wounds Negative for: Bleeding or bruising tendency; Breakdown; Swelling Medical History: Negative for: History of Burn; History of pressure wounds Musculoskeletal Complaints and Symptoms: Negative for: Muscle Pain; Muscle Weakness Neurologic Complaints and Symptoms: Negative for: Numbness/parasthesias; Focal/Weakness Medical History: Positive for: Neuropathy Negative for: Dementia; Quadriplegia; Paraplegia; Seizure  Disorder Psychiatric Complaints and Symptoms: Negative for: Anxiety; Claustrophobia Immunizations Pneumococcal Vaccine: Received Pneumococcal Vaccination: Yes Implantable Devices Yes ALMINA, SCHUL ([id number]) Family and Social History Cancer: Yes - Father; Diabetes: Yes - Mother,Maternal Grandparents; Heart Disease: No; Hereditary Spherocytosis: No; Hypertension: No; Kidney Disease: No; Lung Disease: No; Seizures: No; Stroke: No; Thyroid Problems: No; Tuberculosis: No; Current every day smoker; Marital Status - Married; Alcohol Use: Never; Drug Use: No History; Caffeine Use: Moderate; Financial Concerns: No; Food, Clothing or Shelter Needs: No; Support System Lacking: No; Transportation Concerns: No Electronic Signature(s) Signed: 07/10/2018 3:06:33 PM By: Montey Hora Signed: 07/10/2018 4:30:12 PM By: Linton Ham MD Entered By: Montey Hora on 07/10/2018 13:02:04 Colleen Peters ([id number]) -------------------------------------------------------------------------------- [facility] Details Patient Name: Colleen Peters Date of Service: 07/10/2018 Medical Record Number: [id number] Patient Account Number: [id number] Date of Birth/Sex: Aug 10, 1960 (58 y.o. F) Treating RN: Cornell Barman Primary Care Provider: Cletis Athens Other Clinician: Referring Provider: Gardiner Barefoot Treating Provider/Extender: Ricard Dillon Weeks in Treatment: 0 Diagnosis Coding ICD-10 Codes Code Description E11.621 Type 2 diabetes mellitus with foot ulcer E11.51 Type 2 diabetes mellitus with diabetic peripheral angiopathy without gangrene L97.428 Non-pressure chronic ulcer of left heel and midfoot with other specified severity L97.418 Non-pressure chronic ulcer of right heel and midfoot with other specified severity Facility Procedures CPT4 Code: 16010932 Description: 35573 - WOUND CARE VISIT-LEV 5 EST PT Modifier: Quantity: 1 Physician Procedures CPT4 Code Description: [id number] WC  PHYS LEVEL 3 o NEW PT ICD-10 Diagnosis Description L97.428 Non-pressure chronic ulcer of left heel and midfoot with oth L97.418 Non-pressure chronic ulcer of right heel and midfoot with ot E11.621 Type 2 diabetes  mellitus with foot ulcer E11.51 Type 2 diabetes mellitus  with diabetic peripheral angiopathy Modifier: er specified sever her specified seve without gangrene Quantity: 1 ity rity Electronic Signature(s) Signed: 07/10/2018 4:30:12 PM By: Linton Ham MD Entered By: Linton Ham on 07/10/2018 14:05:07

## 2018-07-12 DIAGNOSIS — E11621 Type 2 diabetes mellitus with foot ulcer: Secondary | ICD-10-CM | POA: Diagnosis not present

## 2018-07-12 DIAGNOSIS — N319 Neuromuscular dysfunction of bladder, unspecified: Secondary | ICD-10-CM | POA: Diagnosis not present

## 2018-07-12 DIAGNOSIS — E1151 Type 2 diabetes mellitus with diabetic peripheral angiopathy without gangrene: Secondary | ICD-10-CM | POA: Diagnosis not present

## 2018-07-12 DIAGNOSIS — I70203 Unspecified atherosclerosis of native arteries of extremities, bilateral legs: Secondary | ICD-10-CM | POA: Diagnosis not present

## 2018-07-12 DIAGNOSIS — L97421 Non-pressure chronic ulcer of left heel and midfoot limited to breakdown of skin: Secondary | ICD-10-CM | POA: Diagnosis not present

## 2018-07-12 DIAGNOSIS — L97411 Non-pressure chronic ulcer of right heel and midfoot limited to breakdown of skin: Secondary | ICD-10-CM | POA: Diagnosis not present

## 2018-07-15 DIAGNOSIS — I420 Dilated cardiomyopathy: Secondary | ICD-10-CM | POA: Diagnosis not present

## 2018-07-15 DIAGNOSIS — I10 Essential (primary) hypertension: Secondary | ICD-10-CM | POA: Diagnosis not present

## 2018-07-15 DIAGNOSIS — I509 Heart failure, unspecified: Secondary | ICD-10-CM | POA: Diagnosis not present

## 2018-07-15 DIAGNOSIS — I2510 Atherosclerotic heart disease of native coronary artery without angina pectoris: Secondary | ICD-10-CM | POA: Diagnosis not present

## 2018-07-15 DIAGNOSIS — R0602 Shortness of breath: Secondary | ICD-10-CM | POA: Diagnosis not present

## 2018-07-16 DIAGNOSIS — L97411 Non-pressure chronic ulcer of right heel and midfoot limited to breakdown of skin: Secondary | ICD-10-CM | POA: Diagnosis not present

## 2018-07-16 DIAGNOSIS — E11621 Type 2 diabetes mellitus with foot ulcer: Secondary | ICD-10-CM | POA: Diagnosis not present

## 2018-07-16 DIAGNOSIS — E1151 Type 2 diabetes mellitus with diabetic peripheral angiopathy without gangrene: Secondary | ICD-10-CM | POA: Diagnosis not present

## 2018-07-16 DIAGNOSIS — L97421 Non-pressure chronic ulcer of left heel and midfoot limited to breakdown of skin: Secondary | ICD-10-CM | POA: Diagnosis not present

## 2018-07-16 DIAGNOSIS — N319 Neuromuscular dysfunction of bladder, unspecified: Secondary | ICD-10-CM | POA: Diagnosis not present

## 2018-07-16 DIAGNOSIS — I70203 Unspecified atherosclerosis of native arteries of extremities, bilateral legs: Secondary | ICD-10-CM | POA: Diagnosis not present

## 2018-07-17 DIAGNOSIS — E11621 Type 2 diabetes mellitus with foot ulcer: Secondary | ICD-10-CM | POA: Diagnosis not present

## 2018-07-17 DIAGNOSIS — L97411 Non-pressure chronic ulcer of right heel and midfoot limited to breakdown of skin: Secondary | ICD-10-CM | POA: Diagnosis not present

## 2018-07-17 DIAGNOSIS — E1151 Type 2 diabetes mellitus with diabetic peripheral angiopathy without gangrene: Secondary | ICD-10-CM | POA: Diagnosis not present

## 2018-07-17 DIAGNOSIS — L97421 Non-pressure chronic ulcer of left heel and midfoot limited to breakdown of skin: Secondary | ICD-10-CM | POA: Diagnosis not present

## 2018-07-17 DIAGNOSIS — I70203 Unspecified atherosclerosis of native arteries of extremities, bilateral legs: Secondary | ICD-10-CM | POA: Diagnosis not present

## 2018-07-17 DIAGNOSIS — N319 Neuromuscular dysfunction of bladder, unspecified: Secondary | ICD-10-CM | POA: Diagnosis not present

## 2018-07-23 DIAGNOSIS — I70203 Unspecified atherosclerosis of native arteries of extremities, bilateral legs: Secondary | ICD-10-CM | POA: Diagnosis not present

## 2018-07-23 DIAGNOSIS — L97421 Non-pressure chronic ulcer of left heel and midfoot limited to breakdown of skin: Secondary | ICD-10-CM | POA: Diagnosis not present

## 2018-07-23 DIAGNOSIS — E11621 Type 2 diabetes mellitus with foot ulcer: Secondary | ICD-10-CM | POA: Diagnosis not present

## 2018-07-23 DIAGNOSIS — E1151 Type 2 diabetes mellitus with diabetic peripheral angiopathy without gangrene: Secondary | ICD-10-CM | POA: Diagnosis not present

## 2018-07-23 DIAGNOSIS — N319 Neuromuscular dysfunction of bladder, unspecified: Secondary | ICD-10-CM | POA: Diagnosis not present

## 2018-07-23 DIAGNOSIS — L97411 Non-pressure chronic ulcer of right heel and midfoot limited to breakdown of skin: Secondary | ICD-10-CM | POA: Diagnosis not present

## 2018-07-24 ENCOUNTER — Ambulatory Visit: Payer: Medicare Other | Admitting: Internal Medicine

## 2018-07-26 DIAGNOSIS — R339 Retention of urine, unspecified: Secondary | ICD-10-CM | POA: Diagnosis not present

## 2018-07-26 DIAGNOSIS — T82111D Breakdown (mechanical) of cardiac pulse generator (battery), subsequent encounter: Secondary | ICD-10-CM | POA: Diagnosis not present

## 2018-08-01 DIAGNOSIS — Z8673 Personal history of transient ischemic attack (TIA), and cerebral infarction without residual deficits: Secondary | ICD-10-CM | POA: Diagnosis not present

## 2018-08-01 DIAGNOSIS — Z7984 Long term (current) use of oral hypoglycemic drugs: Secondary | ICD-10-CM | POA: Diagnosis not present

## 2018-08-01 DIAGNOSIS — I110 Hypertensive heart disease with heart failure: Secondary | ICD-10-CM | POA: Diagnosis not present

## 2018-08-01 DIAGNOSIS — M79672 Pain in left foot: Secondary | ICD-10-CM | POA: Diagnosis not present

## 2018-08-01 DIAGNOSIS — Z951 Presence of aortocoronary bypass graft: Secondary | ICD-10-CM | POA: Diagnosis not present

## 2018-08-01 DIAGNOSIS — Z79899 Other long term (current) drug therapy: Secondary | ICD-10-CM | POA: Diagnosis not present

## 2018-08-01 DIAGNOSIS — I739 Peripheral vascular disease, unspecified: Secondary | ICD-10-CM | POA: Diagnosis not present

## 2018-08-01 DIAGNOSIS — I509 Heart failure, unspecified: Secondary | ICD-10-CM | POA: Diagnosis not present

## 2018-08-01 DIAGNOSIS — M79671 Pain in right foot: Secondary | ICD-10-CM | POA: Diagnosis not present

## 2018-08-01 DIAGNOSIS — Z9049 Acquired absence of other specified parts of digestive tract: Secondary | ICD-10-CM | POA: Diagnosis not present

## 2018-08-01 DIAGNOSIS — I252 Old myocardial infarction: Secondary | ICD-10-CM | POA: Diagnosis not present

## 2018-08-01 DIAGNOSIS — E1151 Type 2 diabetes mellitus with diabetic peripheral angiopathy without gangrene: Secondary | ICD-10-CM | POA: Diagnosis not present

## 2018-08-01 DIAGNOSIS — E11628 Type 2 diabetes mellitus with other skin complications: Secondary | ICD-10-CM | POA: Diagnosis not present

## 2018-08-01 DIAGNOSIS — F17210 Nicotine dependence, cigarettes, uncomplicated: Secondary | ICD-10-CM | POA: Diagnosis not present

## 2018-08-01 DIAGNOSIS — I2510 Atherosclerotic heart disease of native coronary artery without angina pectoris: Secondary | ICD-10-CM | POA: Diagnosis not present

## 2018-08-01 DIAGNOSIS — Z01810 Encounter for preprocedural cardiovascular examination: Secondary | ICD-10-CM | POA: Diagnosis not present

## 2018-08-02 DIAGNOSIS — I129 Hypertensive chronic kidney disease with stage 1 through stage 4 chronic kidney disease, or unspecified chronic kidney disease: Secondary | ICD-10-CM | POA: Diagnosis not present

## 2018-08-02 DIAGNOSIS — Z7982 Long term (current) use of aspirin: Secondary | ICD-10-CM | POA: Diagnosis not present

## 2018-08-02 DIAGNOSIS — N189 Chronic kidney disease, unspecified: Secondary | ICD-10-CM | POA: Diagnosis not present

## 2018-08-02 DIAGNOSIS — L97421 Non-pressure chronic ulcer of left heel and midfoot limited to breakdown of skin: Secondary | ICD-10-CM | POA: Diagnosis not present

## 2018-08-02 DIAGNOSIS — I5020 Unspecified systolic (congestive) heart failure: Secondary | ICD-10-CM | POA: Diagnosis not present

## 2018-08-02 DIAGNOSIS — E11621 Type 2 diabetes mellitus with foot ulcer: Secondary | ICD-10-CM | POA: Diagnosis not present

## 2018-08-02 DIAGNOSIS — E1151 Type 2 diabetes mellitus with diabetic peripheral angiopathy without gangrene: Secondary | ICD-10-CM | POA: Diagnosis not present

## 2018-08-02 DIAGNOSIS — Z79891 Long term (current) use of opiate analgesic: Secondary | ICD-10-CM | POA: Diagnosis not present

## 2018-08-02 DIAGNOSIS — E1122 Type 2 diabetes mellitus with diabetic chronic kidney disease: Secondary | ICD-10-CM | POA: Diagnosis not present

## 2018-08-02 DIAGNOSIS — Z794 Long term (current) use of insulin: Secondary | ICD-10-CM | POA: Diagnosis not present

## 2018-08-02 DIAGNOSIS — I2510 Atherosclerotic heart disease of native coronary artery without angina pectoris: Secondary | ICD-10-CM | POA: Diagnosis not present

## 2018-08-02 DIAGNOSIS — L97411 Non-pressure chronic ulcer of right heel and midfoot limited to breakdown of skin: Secondary | ICD-10-CM | POA: Diagnosis not present

## 2018-08-02 DIAGNOSIS — N319 Neuromuscular dysfunction of bladder, unspecified: Secondary | ICD-10-CM | POA: Diagnosis not present

## 2018-08-02 DIAGNOSIS — F17210 Nicotine dependence, cigarettes, uncomplicated: Secondary | ICD-10-CM | POA: Diagnosis not present

## 2018-08-02 DIAGNOSIS — I70203 Unspecified atherosclerosis of native arteries of extremities, bilateral legs: Secondary | ICD-10-CM | POA: Diagnosis not present

## 2018-08-02 DIAGNOSIS — I252 Old myocardial infarction: Secondary | ICD-10-CM | POA: Diagnosis not present

## 2018-08-05 DIAGNOSIS — E1140 Type 2 diabetes mellitus with diabetic neuropathy, unspecified: Secondary | ICD-10-CM | POA: Diagnosis not present

## 2018-08-05 DIAGNOSIS — F341 Dysthymic disorder: Secondary | ICD-10-CM | POA: Diagnosis not present

## 2018-08-05 DIAGNOSIS — M5440 Lumbago with sciatica, unspecified side: Secondary | ICD-10-CM | POA: Diagnosis not present

## 2018-08-05 DIAGNOSIS — M961 Postlaminectomy syndrome, not elsewhere classified: Secondary | ICD-10-CM | POA: Diagnosis not present

## 2018-08-06 DIAGNOSIS — R0602 Shortness of breath: Secondary | ICD-10-CM | POA: Diagnosis not present

## 2018-08-06 DIAGNOSIS — I10 Essential (primary) hypertension: Secondary | ICD-10-CM | POA: Diagnosis not present

## 2018-08-06 DIAGNOSIS — I509 Heart failure, unspecified: Secondary | ICD-10-CM | POA: Diagnosis not present

## 2018-08-06 DIAGNOSIS — Z9861 Coronary angioplasty status: Secondary | ICD-10-CM | POA: Diagnosis not present

## 2018-08-06 DIAGNOSIS — J449 Chronic obstructive pulmonary disease, unspecified: Secondary | ICD-10-CM | POA: Diagnosis not present

## 2018-08-06 DIAGNOSIS — I255 Ischemic cardiomyopathy: Secondary | ICD-10-CM | POA: Diagnosis not present

## 2018-08-06 DIAGNOSIS — I2510 Atherosclerotic heart disease of native coronary artery without angina pectoris: Secondary | ICD-10-CM | POA: Diagnosis not present

## 2018-08-07 DIAGNOSIS — R339 Retention of urine, unspecified: Secondary | ICD-10-CM | POA: Diagnosis not present

## 2018-08-08 DIAGNOSIS — I2510 Atherosclerotic heart disease of native coronary artery without angina pectoris: Secondary | ICD-10-CM | POA: Diagnosis not present

## 2018-08-08 DIAGNOSIS — I739 Peripheral vascular disease, unspecified: Secondary | ICD-10-CM | POA: Diagnosis not present

## 2018-08-09 DIAGNOSIS — I70203 Unspecified atherosclerosis of native arteries of extremities, bilateral legs: Secondary | ICD-10-CM | POA: Diagnosis not present

## 2018-08-09 DIAGNOSIS — N319 Neuromuscular dysfunction of bladder, unspecified: Secondary | ICD-10-CM | POA: Diagnosis not present

## 2018-08-09 DIAGNOSIS — E1151 Type 2 diabetes mellitus with diabetic peripheral angiopathy without gangrene: Secondary | ICD-10-CM | POA: Diagnosis not present

## 2018-08-09 DIAGNOSIS — L97421 Non-pressure chronic ulcer of left heel and midfoot limited to breakdown of skin: Secondary | ICD-10-CM | POA: Diagnosis not present

## 2018-08-09 DIAGNOSIS — E11621 Type 2 diabetes mellitus with foot ulcer: Secondary | ICD-10-CM | POA: Diagnosis not present

## 2018-08-09 DIAGNOSIS — L97411 Non-pressure chronic ulcer of right heel and midfoot limited to breakdown of skin: Secondary | ICD-10-CM | POA: Diagnosis not present

## 2018-08-12 DIAGNOSIS — I70203 Unspecified atherosclerosis of native arteries of extremities, bilateral legs: Secondary | ICD-10-CM | POA: Diagnosis not present

## 2018-08-12 DIAGNOSIS — E11621 Type 2 diabetes mellitus with foot ulcer: Secondary | ICD-10-CM | POA: Diagnosis not present

## 2018-08-12 DIAGNOSIS — L97411 Non-pressure chronic ulcer of right heel and midfoot limited to breakdown of skin: Secondary | ICD-10-CM | POA: Diagnosis not present

## 2018-08-12 DIAGNOSIS — L97421 Non-pressure chronic ulcer of left heel and midfoot limited to breakdown of skin: Secondary | ICD-10-CM | POA: Diagnosis not present

## 2018-08-12 DIAGNOSIS — N319 Neuromuscular dysfunction of bladder, unspecified: Secondary | ICD-10-CM | POA: Diagnosis not present

## 2018-08-12 DIAGNOSIS — E1151 Type 2 diabetes mellitus with diabetic peripheral angiopathy without gangrene: Secondary | ICD-10-CM | POA: Diagnosis not present

## 2018-08-16 DIAGNOSIS — E119 Type 2 diabetes mellitus without complications: Secondary | ICD-10-CM | POA: Diagnosis not present

## 2018-08-16 DIAGNOSIS — Z79899 Other long term (current) drug therapy: Secondary | ICD-10-CM | POA: Diagnosis not present

## 2018-08-16 DIAGNOSIS — Z794 Long term (current) use of insulin: Secondary | ICD-10-CM | POA: Diagnosis not present

## 2018-08-16 DIAGNOSIS — I110 Hypertensive heart disease with heart failure: Secondary | ICD-10-CM | POA: Diagnosis not present

## 2018-08-16 DIAGNOSIS — I209 Angina pectoris, unspecified: Secondary | ICD-10-CM | POA: Diagnosis not present

## 2018-08-16 DIAGNOSIS — I252 Old myocardial infarction: Secondary | ICD-10-CM | POA: Diagnosis not present

## 2018-08-16 DIAGNOSIS — I2510 Atherosclerotic heart disease of native coronary artery without angina pectoris: Secondary | ICD-10-CM | POA: Diagnosis not present

## 2018-08-16 DIAGNOSIS — I5022 Chronic systolic (congestive) heart failure: Secondary | ICD-10-CM | POA: Diagnosis not present

## 2018-08-16 DIAGNOSIS — F17210 Nicotine dependence, cigarettes, uncomplicated: Secondary | ICD-10-CM | POA: Diagnosis not present

## 2018-08-16 DIAGNOSIS — Z716 Tobacco abuse counseling: Secondary | ICD-10-CM | POA: Diagnosis not present

## 2018-08-21 DIAGNOSIS — L97411 Non-pressure chronic ulcer of right heel and midfoot limited to breakdown of skin: Secondary | ICD-10-CM | POA: Diagnosis not present

## 2018-08-21 DIAGNOSIS — I70203 Unspecified atherosclerosis of native arteries of extremities, bilateral legs: Secondary | ICD-10-CM | POA: Diagnosis not present

## 2018-08-21 DIAGNOSIS — E11621 Type 2 diabetes mellitus with foot ulcer: Secondary | ICD-10-CM | POA: Diagnosis not present

## 2018-08-21 DIAGNOSIS — E1151 Type 2 diabetes mellitus with diabetic peripheral angiopathy without gangrene: Secondary | ICD-10-CM | POA: Diagnosis not present

## 2018-08-21 DIAGNOSIS — N319 Neuromuscular dysfunction of bladder, unspecified: Secondary | ICD-10-CM | POA: Diagnosis not present

## 2018-08-21 DIAGNOSIS — L97421 Non-pressure chronic ulcer of left heel and midfoot limited to breakdown of skin: Secondary | ICD-10-CM | POA: Diagnosis not present

## 2018-08-23 DIAGNOSIS — M79672 Pain in left foot: Secondary | ICD-10-CM | POA: Diagnosis not present

## 2018-08-23 DIAGNOSIS — F17210 Nicotine dependence, cigarettes, uncomplicated: Secondary | ICD-10-CM | POA: Diagnosis not present

## 2018-08-23 DIAGNOSIS — I25118 Atherosclerotic heart disease of native coronary artery with other forms of angina pectoris: Secondary | ICD-10-CM | POA: Diagnosis not present

## 2018-08-23 DIAGNOSIS — I252 Old myocardial infarction: Secondary | ICD-10-CM | POA: Diagnosis not present

## 2018-08-23 DIAGNOSIS — I110 Hypertensive heart disease with heart failure: Secondary | ICD-10-CM | POA: Diagnosis not present

## 2018-08-23 DIAGNOSIS — I2510 Atherosclerotic heart disease of native coronary artery without angina pectoris: Secondary | ICD-10-CM | POA: Diagnosis not present

## 2018-08-23 DIAGNOSIS — Z7982 Long term (current) use of aspirin: Secondary | ICD-10-CM | POA: Diagnosis not present

## 2018-08-23 DIAGNOSIS — Z794 Long term (current) use of insulin: Secondary | ICD-10-CM | POA: Diagnosis not present

## 2018-08-23 DIAGNOSIS — Z79899 Other long term (current) drug therapy: Secondary | ICD-10-CM | POA: Diagnosis not present

## 2018-08-23 DIAGNOSIS — L97529 Non-pressure chronic ulcer of other part of left foot with unspecified severity: Secondary | ICD-10-CM | POA: Diagnosis not present

## 2018-08-23 DIAGNOSIS — E11621 Type 2 diabetes mellitus with foot ulcer: Secondary | ICD-10-CM | POA: Diagnosis not present

## 2018-08-23 DIAGNOSIS — I429 Cardiomyopathy, unspecified: Secondary | ICD-10-CM | POA: Diagnosis not present

## 2018-08-23 DIAGNOSIS — I5022 Chronic systolic (congestive) heart failure: Secondary | ICD-10-CM | POA: Diagnosis not present

## 2018-08-28 DIAGNOSIS — E11621 Type 2 diabetes mellitus with foot ulcer: Secondary | ICD-10-CM | POA: Diagnosis not present

## 2018-08-28 DIAGNOSIS — I70203 Unspecified atherosclerosis of native arteries of extremities, bilateral legs: Secondary | ICD-10-CM | POA: Diagnosis not present

## 2018-08-28 DIAGNOSIS — N319 Neuromuscular dysfunction of bladder, unspecified: Secondary | ICD-10-CM | POA: Diagnosis not present

## 2018-08-28 DIAGNOSIS — L97421 Non-pressure chronic ulcer of left heel and midfoot limited to breakdown of skin: Secondary | ICD-10-CM | POA: Diagnosis not present

## 2018-08-28 DIAGNOSIS — L97411 Non-pressure chronic ulcer of right heel and midfoot limited to breakdown of skin: Secondary | ICD-10-CM | POA: Diagnosis not present

## 2018-08-28 DIAGNOSIS — E1151 Type 2 diabetes mellitus with diabetic peripheral angiopathy without gangrene: Secondary | ICD-10-CM | POA: Diagnosis not present

## 2018-09-01 DIAGNOSIS — N189 Chronic kidney disease, unspecified: Secondary | ICD-10-CM | POA: Diagnosis not present

## 2018-09-01 DIAGNOSIS — E11621 Type 2 diabetes mellitus with foot ulcer: Secondary | ICD-10-CM | POA: Diagnosis not present

## 2018-09-01 DIAGNOSIS — Z794 Long term (current) use of insulin: Secondary | ICD-10-CM | POA: Diagnosis not present

## 2018-09-01 DIAGNOSIS — Z7982 Long term (current) use of aspirin: Secondary | ICD-10-CM | POA: Diagnosis not present

## 2018-09-01 DIAGNOSIS — Z79891 Long term (current) use of opiate analgesic: Secondary | ICD-10-CM | POA: Diagnosis not present

## 2018-09-01 DIAGNOSIS — E1122 Type 2 diabetes mellitus with diabetic chronic kidney disease: Secondary | ICD-10-CM | POA: Diagnosis not present

## 2018-09-01 DIAGNOSIS — I5020 Unspecified systolic (congestive) heart failure: Secondary | ICD-10-CM | POA: Diagnosis not present

## 2018-09-01 DIAGNOSIS — L97411 Non-pressure chronic ulcer of right heel and midfoot limited to breakdown of skin: Secondary | ICD-10-CM | POA: Diagnosis not present

## 2018-09-01 DIAGNOSIS — I2510 Atherosclerotic heart disease of native coronary artery without angina pectoris: Secondary | ICD-10-CM | POA: Diagnosis not present

## 2018-09-01 DIAGNOSIS — I129 Hypertensive chronic kidney disease with stage 1 through stage 4 chronic kidney disease, or unspecified chronic kidney disease: Secondary | ICD-10-CM | POA: Diagnosis not present

## 2018-09-01 DIAGNOSIS — L97421 Non-pressure chronic ulcer of left heel and midfoot limited to breakdown of skin: Secondary | ICD-10-CM | POA: Diagnosis not present

## 2018-09-01 DIAGNOSIS — E1151 Type 2 diabetes mellitus with diabetic peripheral angiopathy without gangrene: Secondary | ICD-10-CM | POA: Diagnosis not present

## 2018-09-01 DIAGNOSIS — N319 Neuromuscular dysfunction of bladder, unspecified: Secondary | ICD-10-CM | POA: Diagnosis not present

## 2018-09-01 DIAGNOSIS — F17210 Nicotine dependence, cigarettes, uncomplicated: Secondary | ICD-10-CM | POA: Diagnosis not present

## 2018-09-01 DIAGNOSIS — I252 Old myocardial infarction: Secondary | ICD-10-CM | POA: Diagnosis not present

## 2018-09-01 DIAGNOSIS — Z4800 Encounter for change or removal of nonsurgical wound dressing: Secondary | ICD-10-CM | POA: Diagnosis not present

## 2018-09-01 DIAGNOSIS — I70203 Unspecified atherosclerosis of native arteries of extremities, bilateral legs: Secondary | ICD-10-CM | POA: Diagnosis not present

## 2018-09-02 DIAGNOSIS — E11621 Type 2 diabetes mellitus with foot ulcer: Secondary | ICD-10-CM | POA: Diagnosis not present

## 2018-09-02 DIAGNOSIS — I2510 Atherosclerotic heart disease of native coronary artery without angina pectoris: Secondary | ICD-10-CM | POA: Diagnosis not present

## 2018-09-02 DIAGNOSIS — L97509 Non-pressure chronic ulcer of other part of unspecified foot with unspecified severity: Secondary | ICD-10-CM | POA: Diagnosis not present

## 2018-09-02 DIAGNOSIS — I10 Essential (primary) hypertension: Secondary | ICD-10-CM | POA: Diagnosis not present

## 2018-09-02 DIAGNOSIS — R399 Unspecified symptoms and signs involving the genitourinary system: Secondary | ICD-10-CM | POA: Diagnosis not present

## 2018-09-02 DIAGNOSIS — I5022 Chronic systolic (congestive) heart failure: Secondary | ICD-10-CM | POA: Diagnosis not present

## 2018-09-03 DIAGNOSIS — E1151 Type 2 diabetes mellitus with diabetic peripheral angiopathy without gangrene: Secondary | ICD-10-CM | POA: Diagnosis not present

## 2018-09-03 DIAGNOSIS — L97421 Non-pressure chronic ulcer of left heel and midfoot limited to breakdown of skin: Secondary | ICD-10-CM | POA: Diagnosis not present

## 2018-09-03 DIAGNOSIS — M5440 Lumbago with sciatica, unspecified side: Secondary | ICD-10-CM | POA: Diagnosis not present

## 2018-09-03 DIAGNOSIS — E1140 Type 2 diabetes mellitus with diabetic neuropathy, unspecified: Secondary | ICD-10-CM | POA: Diagnosis not present

## 2018-09-03 DIAGNOSIS — E11621 Type 2 diabetes mellitus with foot ulcer: Secondary | ICD-10-CM | POA: Diagnosis not present

## 2018-09-03 DIAGNOSIS — Z4800 Encounter for change or removal of nonsurgical wound dressing: Secondary | ICD-10-CM | POA: Diagnosis not present

## 2018-09-03 DIAGNOSIS — M961 Postlaminectomy syndrome, not elsewhere classified: Secondary | ICD-10-CM | POA: Diagnosis not present

## 2018-09-03 DIAGNOSIS — I70203 Unspecified atherosclerosis of native arteries of extremities, bilateral legs: Secondary | ICD-10-CM | POA: Diagnosis not present

## 2018-09-03 DIAGNOSIS — L97411 Non-pressure chronic ulcer of right heel and midfoot limited to breakdown of skin: Secondary | ICD-10-CM | POA: Diagnosis not present

## 2018-09-03 DIAGNOSIS — M797 Fibromyalgia: Secondary | ICD-10-CM | POA: Diagnosis not present

## 2018-09-04 DIAGNOSIS — Z4800 Encounter for change or removal of nonsurgical wound dressing: Secondary | ICD-10-CM | POA: Diagnosis not present

## 2018-09-04 DIAGNOSIS — L97421 Non-pressure chronic ulcer of left heel and midfoot limited to breakdown of skin: Secondary | ICD-10-CM | POA: Diagnosis not present

## 2018-09-04 DIAGNOSIS — E11621 Type 2 diabetes mellitus with foot ulcer: Secondary | ICD-10-CM | POA: Diagnosis not present

## 2018-09-04 DIAGNOSIS — L97411 Non-pressure chronic ulcer of right heel and midfoot limited to breakdown of skin: Secondary | ICD-10-CM | POA: Diagnosis not present

## 2018-09-04 DIAGNOSIS — E1151 Type 2 diabetes mellitus with diabetic peripheral angiopathy without gangrene: Secondary | ICD-10-CM | POA: Diagnosis not present

## 2018-09-04 DIAGNOSIS — I70203 Unspecified atherosclerosis of native arteries of extremities, bilateral legs: Secondary | ICD-10-CM | POA: Diagnosis not present

## 2018-09-12 DIAGNOSIS — L97411 Non-pressure chronic ulcer of right heel and midfoot limited to breakdown of skin: Secondary | ICD-10-CM | POA: Diagnosis not present

## 2018-09-12 DIAGNOSIS — Z4800 Encounter for change or removal of nonsurgical wound dressing: Secondary | ICD-10-CM | POA: Diagnosis not present

## 2018-09-12 DIAGNOSIS — I70203 Unspecified atherosclerosis of native arteries of extremities, bilateral legs: Secondary | ICD-10-CM | POA: Diagnosis not present

## 2018-09-12 DIAGNOSIS — E1151 Type 2 diabetes mellitus with diabetic peripheral angiopathy without gangrene: Secondary | ICD-10-CM | POA: Diagnosis not present

## 2018-09-12 DIAGNOSIS — L97421 Non-pressure chronic ulcer of left heel and midfoot limited to breakdown of skin: Secondary | ICD-10-CM | POA: Diagnosis not present

## 2018-09-12 DIAGNOSIS — E11621 Type 2 diabetes mellitus with foot ulcer: Secondary | ICD-10-CM | POA: Diagnosis not present

## 2018-09-19 DIAGNOSIS — I5022 Chronic systolic (congestive) heart failure: Secondary | ICD-10-CM | POA: Diagnosis not present

## 2018-09-19 DIAGNOSIS — I209 Angina pectoris, unspecified: Secondary | ICD-10-CM | POA: Diagnosis not present

## 2018-09-19 DIAGNOSIS — I63332 Cerebral infarction due to thrombosis of left posterior cerebral artery: Secondary | ICD-10-CM | POA: Diagnosis not present

## 2018-09-19 DIAGNOSIS — I10 Essential (primary) hypertension: Secondary | ICD-10-CM | POA: Diagnosis not present

## 2018-09-19 DIAGNOSIS — I2510 Atherosclerotic heart disease of native coronary artery without angina pectoris: Secondary | ICD-10-CM | POA: Diagnosis not present

## 2018-09-20 DIAGNOSIS — L97411 Non-pressure chronic ulcer of right heel and midfoot limited to breakdown of skin: Secondary | ICD-10-CM | POA: Diagnosis not present

## 2018-09-20 DIAGNOSIS — I70203 Unspecified atherosclerosis of native arteries of extremities, bilateral legs: Secondary | ICD-10-CM | POA: Diagnosis not present

## 2018-09-20 DIAGNOSIS — L97421 Non-pressure chronic ulcer of left heel and midfoot limited to breakdown of skin: Secondary | ICD-10-CM | POA: Diagnosis not present

## 2018-09-20 DIAGNOSIS — Z4800 Encounter for change or removal of nonsurgical wound dressing: Secondary | ICD-10-CM | POA: Diagnosis not present

## 2018-09-20 DIAGNOSIS — E1151 Type 2 diabetes mellitus with diabetic peripheral angiopathy without gangrene: Secondary | ICD-10-CM | POA: Diagnosis not present

## 2018-09-20 DIAGNOSIS — E11621 Type 2 diabetes mellitus with foot ulcer: Secondary | ICD-10-CM | POA: Diagnosis not present

## 2018-09-26 DIAGNOSIS — I739 Peripheral vascular disease, unspecified: Secondary | ICD-10-CM | POA: Diagnosis not present

## 2018-09-26 DIAGNOSIS — S91302A Unspecified open wound, left foot, initial encounter: Secondary | ICD-10-CM | POA: Diagnosis not present

## 2018-09-26 DIAGNOSIS — S91301A Unspecified open wound, right foot, initial encounter: Secondary | ICD-10-CM | POA: Diagnosis not present

## 2018-09-27 DIAGNOSIS — E1151 Type 2 diabetes mellitus with diabetic peripheral angiopathy without gangrene: Secondary | ICD-10-CM | POA: Diagnosis not present

## 2018-09-27 DIAGNOSIS — E11621 Type 2 diabetes mellitus with foot ulcer: Secondary | ICD-10-CM | POA: Diagnosis not present

## 2018-09-27 DIAGNOSIS — L97411 Non-pressure chronic ulcer of right heel and midfoot limited to breakdown of skin: Secondary | ICD-10-CM | POA: Diagnosis not present

## 2018-09-27 DIAGNOSIS — Z4800 Encounter for change or removal of nonsurgical wound dressing: Secondary | ICD-10-CM | POA: Diagnosis not present

## 2018-09-27 DIAGNOSIS — L97421 Non-pressure chronic ulcer of left heel and midfoot limited to breakdown of skin: Secondary | ICD-10-CM | POA: Diagnosis not present

## 2018-09-27 DIAGNOSIS — I70203 Unspecified atherosclerosis of native arteries of extremities, bilateral legs: Secondary | ICD-10-CM | POA: Diagnosis not present

## 2018-10-01 DIAGNOSIS — F17210 Nicotine dependence, cigarettes, uncomplicated: Secondary | ICD-10-CM | POA: Diagnosis not present

## 2018-10-01 DIAGNOSIS — I5020 Unspecified systolic (congestive) heart failure: Secondary | ICD-10-CM | POA: Diagnosis not present

## 2018-10-01 DIAGNOSIS — E1122 Type 2 diabetes mellitus with diabetic chronic kidney disease: Secondary | ICD-10-CM | POA: Diagnosis not present

## 2018-10-01 DIAGNOSIS — Z7982 Long term (current) use of aspirin: Secondary | ICD-10-CM | POA: Diagnosis not present

## 2018-10-01 DIAGNOSIS — Z794 Long term (current) use of insulin: Secondary | ICD-10-CM | POA: Diagnosis not present

## 2018-10-01 DIAGNOSIS — I70203 Unspecified atherosclerosis of native arteries of extremities, bilateral legs: Secondary | ICD-10-CM | POA: Diagnosis not present

## 2018-10-01 DIAGNOSIS — I252 Old myocardial infarction: Secondary | ICD-10-CM | POA: Diagnosis not present

## 2018-10-01 DIAGNOSIS — Z4800 Encounter for change or removal of nonsurgical wound dressing: Secondary | ICD-10-CM | POA: Diagnosis not present

## 2018-10-01 DIAGNOSIS — N189 Chronic kidney disease, unspecified: Secondary | ICD-10-CM | POA: Diagnosis not present

## 2018-10-01 DIAGNOSIS — L97411 Non-pressure chronic ulcer of right heel and midfoot limited to breakdown of skin: Secondary | ICD-10-CM | POA: Diagnosis not present

## 2018-10-01 DIAGNOSIS — E1151 Type 2 diabetes mellitus with diabetic peripheral angiopathy without gangrene: Secondary | ICD-10-CM | POA: Diagnosis not present

## 2018-10-01 DIAGNOSIS — Z79891 Long term (current) use of opiate analgesic: Secondary | ICD-10-CM | POA: Diagnosis not present

## 2018-10-01 DIAGNOSIS — E11621 Type 2 diabetes mellitus with foot ulcer: Secondary | ICD-10-CM | POA: Diagnosis not present

## 2018-10-01 DIAGNOSIS — N319 Neuromuscular dysfunction of bladder, unspecified: Secondary | ICD-10-CM | POA: Diagnosis not present

## 2018-10-01 DIAGNOSIS — I129 Hypertensive chronic kidney disease with stage 1 through stage 4 chronic kidney disease, or unspecified chronic kidney disease: Secondary | ICD-10-CM | POA: Diagnosis not present

## 2018-10-01 DIAGNOSIS — L97421 Non-pressure chronic ulcer of left heel and midfoot limited to breakdown of skin: Secondary | ICD-10-CM | POA: Diagnosis not present

## 2018-10-01 DIAGNOSIS — I2510 Atherosclerotic heart disease of native coronary artery without angina pectoris: Secondary | ICD-10-CM | POA: Diagnosis not present

## 2018-10-04 ENCOUNTER — Other Ambulatory Visit: Payer: Self-pay | Admitting: Internal Medicine

## 2018-10-04 DIAGNOSIS — E1151 Type 2 diabetes mellitus with diabetic peripheral angiopathy without gangrene: Secondary | ICD-10-CM | POA: Diagnosis not present

## 2018-10-04 DIAGNOSIS — Z4800 Encounter for change or removal of nonsurgical wound dressing: Secondary | ICD-10-CM | POA: Diagnosis not present

## 2018-10-04 DIAGNOSIS — M5440 Lumbago with sciatica, unspecified side: Secondary | ICD-10-CM | POA: Diagnosis not present

## 2018-10-04 DIAGNOSIS — L97421 Non-pressure chronic ulcer of left heel and midfoot limited to breakdown of skin: Secondary | ICD-10-CM | POA: Diagnosis not present

## 2018-10-04 DIAGNOSIS — E11621 Type 2 diabetes mellitus with foot ulcer: Secondary | ICD-10-CM | POA: Diagnosis not present

## 2018-10-04 DIAGNOSIS — E119 Type 2 diabetes mellitus without complications: Secondary | ICD-10-CM | POA: Diagnosis not present

## 2018-10-04 DIAGNOSIS — I70203 Unspecified atherosclerosis of native arteries of extremities, bilateral legs: Secondary | ICD-10-CM | POA: Diagnosis not present

## 2018-10-04 DIAGNOSIS — L97411 Non-pressure chronic ulcer of right heel and midfoot limited to breakdown of skin: Secondary | ICD-10-CM | POA: Diagnosis not present

## 2018-10-04 NOTE — Progress Notes (Signed)
I am seeing pts at [facility] for Dr Lavera Guise, this pt needs norco refill.

## 2018-10-07 DIAGNOSIS — I70203 Unspecified atherosclerosis of native arteries of extremities, bilateral legs: Secondary | ICD-10-CM | POA: Diagnosis not present

## 2018-10-07 DIAGNOSIS — Z4800 Encounter for change or removal of nonsurgical wound dressing: Secondary | ICD-10-CM | POA: Diagnosis not present

## 2018-10-07 DIAGNOSIS — E11621 Type 2 diabetes mellitus with foot ulcer: Secondary | ICD-10-CM | POA: Diagnosis not present

## 2018-10-07 DIAGNOSIS — L97421 Non-pressure chronic ulcer of left heel and midfoot limited to breakdown of skin: Secondary | ICD-10-CM | POA: Diagnosis not present

## 2018-10-07 DIAGNOSIS — E1151 Type 2 diabetes mellitus with diabetic peripheral angiopathy without gangrene: Secondary | ICD-10-CM | POA: Diagnosis not present

## 2018-10-07 DIAGNOSIS — L97411 Non-pressure chronic ulcer of right heel and midfoot limited to breakdown of skin: Secondary | ICD-10-CM | POA: Diagnosis not present

## 2018-10-11 DIAGNOSIS — Z4800 Encounter for change or removal of nonsurgical wound dressing: Secondary | ICD-10-CM | POA: Diagnosis not present

## 2018-10-11 DIAGNOSIS — E1151 Type 2 diabetes mellitus with diabetic peripheral angiopathy without gangrene: Secondary | ICD-10-CM | POA: Diagnosis not present

## 2018-10-11 DIAGNOSIS — I70203 Unspecified atherosclerosis of native arteries of extremities, bilateral legs: Secondary | ICD-10-CM | POA: Diagnosis not present

## 2018-10-11 DIAGNOSIS — L97421 Non-pressure chronic ulcer of left heel and midfoot limited to breakdown of skin: Secondary | ICD-10-CM | POA: Diagnosis not present

## 2018-10-11 DIAGNOSIS — L97411 Non-pressure chronic ulcer of right heel and midfoot limited to breakdown of skin: Secondary | ICD-10-CM | POA: Diagnosis not present

## 2018-10-11 DIAGNOSIS — E11621 Type 2 diabetes mellitus with foot ulcer: Secondary | ICD-10-CM | POA: Diagnosis not present

## 2018-10-14 DIAGNOSIS — Z1159 Encounter for screening for other viral diseases: Secondary | ICD-10-CM | POA: Diagnosis not present

## 2018-10-18 DIAGNOSIS — Z4800 Encounter for change or removal of nonsurgical wound dressing: Secondary | ICD-10-CM | POA: Diagnosis not present

## 2018-10-18 DIAGNOSIS — L97411 Non-pressure chronic ulcer of right heel and midfoot limited to breakdown of skin: Secondary | ICD-10-CM | POA: Diagnosis not present

## 2018-10-18 DIAGNOSIS — E11621 Type 2 diabetes mellitus with foot ulcer: Secondary | ICD-10-CM | POA: Diagnosis not present

## 2018-10-18 DIAGNOSIS — I70203 Unspecified atherosclerosis of native arteries of extremities, bilateral legs: Secondary | ICD-10-CM | POA: Diagnosis not present

## 2018-10-18 DIAGNOSIS — E1151 Type 2 diabetes mellitus with diabetic peripheral angiopathy without gangrene: Secondary | ICD-10-CM | POA: Diagnosis not present

## 2018-10-18 DIAGNOSIS — L97421 Non-pressure chronic ulcer of left heel and midfoot limited to breakdown of skin: Secondary | ICD-10-CM | POA: Diagnosis not present

## 2018-10-23 DIAGNOSIS — E1122 Type 2 diabetes mellitus with diabetic chronic kidney disease: Secondary | ICD-10-CM | POA: Diagnosis not present

## 2018-10-23 DIAGNOSIS — Z794 Long term (current) use of insulin: Secondary | ICD-10-CM | POA: Diagnosis not present

## 2018-10-23 DIAGNOSIS — Z7982 Long term (current) use of aspirin: Secondary | ICD-10-CM | POA: Diagnosis not present

## 2018-10-23 DIAGNOSIS — Z9049 Acquired absence of other specified parts of digestive tract: Secondary | ICD-10-CM | POA: Diagnosis not present

## 2018-10-23 DIAGNOSIS — E1151 Type 2 diabetes mellitus with diabetic peripheral angiopathy without gangrene: Secondary | ICD-10-CM | POA: Diagnosis not present

## 2018-10-23 DIAGNOSIS — Z7902 Long term (current) use of antithrombotics/antiplatelets: Secondary | ICD-10-CM | POA: Diagnosis not present

## 2018-10-23 DIAGNOSIS — I10 Essential (primary) hypertension: Secondary | ICD-10-CM | POA: Diagnosis not present

## 2018-10-23 DIAGNOSIS — Z955 Presence of coronary angioplasty implant and graft: Secondary | ICD-10-CM | POA: Diagnosis not present

## 2018-10-23 DIAGNOSIS — Z1159 Encounter for screening for other viral diseases: Secondary | ICD-10-CM | POA: Diagnosis not present

## 2018-10-23 DIAGNOSIS — I252 Old myocardial infarction: Secondary | ICD-10-CM | POA: Diagnosis not present

## 2018-10-23 DIAGNOSIS — L97429 Non-pressure chronic ulcer of left heel and midfoot with unspecified severity: Secondary | ICD-10-CM | POA: Diagnosis not present

## 2018-10-23 DIAGNOSIS — E11621 Type 2 diabetes mellitus with foot ulcer: Secondary | ICD-10-CM | POA: Diagnosis not present

## 2018-10-23 DIAGNOSIS — I700 Atherosclerosis of aorta: Secondary | ICD-10-CM | POA: Diagnosis not present

## 2018-10-23 DIAGNOSIS — I5022 Chronic systolic (congestive) heart failure: Secondary | ICD-10-CM | POA: Diagnosis not present

## 2018-10-23 DIAGNOSIS — Z8673 Personal history of transient ischemic attack (TIA), and cerebral infarction without residual deficits: Secondary | ICD-10-CM | POA: Diagnosis not present

## 2018-10-23 DIAGNOSIS — I340 Nonrheumatic mitral (valve) insufficiency: Secondary | ICD-10-CM | POA: Diagnosis not present

## 2018-10-23 DIAGNOSIS — N189 Chronic kidney disease, unspecified: Secondary | ICD-10-CM | POA: Diagnosis not present

## 2018-10-23 DIAGNOSIS — I130 Hypertensive heart and chronic kidney disease with heart failure and stage 1 through stage 4 chronic kidney disease, or unspecified chronic kidney disease: Secondary | ICD-10-CM | POA: Diagnosis not present

## 2018-10-23 DIAGNOSIS — L97419 Non-pressure chronic ulcer of right heel and midfoot with unspecified severity: Secondary | ICD-10-CM | POA: Diagnosis not present

## 2018-10-23 DIAGNOSIS — M797 Fibromyalgia: Secondary | ICD-10-CM | POA: Diagnosis not present

## 2018-10-23 DIAGNOSIS — I2510 Atherosclerotic heart disease of native coronary artery without angina pectoris: Secondary | ICD-10-CM | POA: Diagnosis not present

## 2018-10-24 DIAGNOSIS — I5022 Chronic systolic (congestive) heart failure: Secondary | ICD-10-CM | POA: Diagnosis not present

## 2018-10-24 DIAGNOSIS — I2510 Atherosclerotic heart disease of native coronary artery without angina pectoris: Secondary | ICD-10-CM | POA: Diagnosis not present

## 2018-10-24 DIAGNOSIS — I10 Essential (primary) hypertension: Secondary | ICD-10-CM | POA: Diagnosis not present

## 2018-10-24 DIAGNOSIS — F17210 Nicotine dependence, cigarettes, uncomplicated: Secondary | ICD-10-CM | POA: Diagnosis not present

## 2018-10-24 DIAGNOSIS — L97419 Non-pressure chronic ulcer of right heel and midfoot with unspecified severity: Secondary | ICD-10-CM | POA: Diagnosis not present

## 2018-10-24 DIAGNOSIS — N189 Chronic kidney disease, unspecified: Secondary | ICD-10-CM | POA: Diagnosis not present

## 2018-10-24 DIAGNOSIS — E11621 Type 2 diabetes mellitus with foot ulcer: Secondary | ICD-10-CM | POA: Diagnosis not present

## 2018-10-24 DIAGNOSIS — I130 Hypertensive heart and chronic kidney disease with heart failure and stage 1 through stage 4 chronic kidney disease, or unspecified chronic kidney disease: Secondary | ICD-10-CM | POA: Diagnosis not present

## 2018-10-25 DIAGNOSIS — L97411 Non-pressure chronic ulcer of right heel and midfoot limited to breakdown of skin: Secondary | ICD-10-CM | POA: Diagnosis not present

## 2018-10-25 DIAGNOSIS — E11621 Type 2 diabetes mellitus with foot ulcer: Secondary | ICD-10-CM | POA: Diagnosis not present

## 2018-10-25 DIAGNOSIS — I70203 Unspecified atherosclerosis of native arteries of extremities, bilateral legs: Secondary | ICD-10-CM | POA: Diagnosis not present

## 2018-10-25 DIAGNOSIS — E1151 Type 2 diabetes mellitus with diabetic peripheral angiopathy without gangrene: Secondary | ICD-10-CM | POA: Diagnosis not present

## 2018-10-25 DIAGNOSIS — L97421 Non-pressure chronic ulcer of left heel and midfoot limited to breakdown of skin: Secondary | ICD-10-CM | POA: Diagnosis not present

## 2018-10-25 DIAGNOSIS — Z4800 Encounter for change or removal of nonsurgical wound dressing: Secondary | ICD-10-CM | POA: Diagnosis not present

## 2018-10-28 DIAGNOSIS — L97421 Non-pressure chronic ulcer of left heel and midfoot limited to breakdown of skin: Secondary | ICD-10-CM | POA: Diagnosis not present

## 2018-10-28 DIAGNOSIS — L97411 Non-pressure chronic ulcer of right heel and midfoot limited to breakdown of skin: Secondary | ICD-10-CM | POA: Diagnosis not present

## 2018-10-28 DIAGNOSIS — E1151 Type 2 diabetes mellitus with diabetic peripheral angiopathy without gangrene: Secondary | ICD-10-CM | POA: Diagnosis not present

## 2018-10-28 DIAGNOSIS — I70203 Unspecified atherosclerosis of native arteries of extremities, bilateral legs: Secondary | ICD-10-CM | POA: Diagnosis not present

## 2018-10-28 DIAGNOSIS — Z4800 Encounter for change or removal of nonsurgical wound dressing: Secondary | ICD-10-CM | POA: Diagnosis not present

## 2018-10-28 DIAGNOSIS — E11621 Type 2 diabetes mellitus with foot ulcer: Secondary | ICD-10-CM | POA: Diagnosis not present

## 2018-10-30 DIAGNOSIS — E1151 Type 2 diabetes mellitus with diabetic peripheral angiopathy without gangrene: Secondary | ICD-10-CM | POA: Diagnosis not present

## 2018-10-30 DIAGNOSIS — I5022 Chronic systolic (congestive) heart failure: Secondary | ICD-10-CM | POA: Diagnosis not present

## 2018-10-30 DIAGNOSIS — I10 Essential (primary) hypertension: Secondary | ICD-10-CM | POA: Diagnosis not present

## 2018-10-30 DIAGNOSIS — Z794 Long term (current) use of insulin: Secondary | ICD-10-CM | POA: Diagnosis not present

## 2018-10-30 DIAGNOSIS — I2510 Atherosclerotic heart disease of native coronary artery without angina pectoris: Secondary | ICD-10-CM | POA: Diagnosis not present

## 2018-10-31 DIAGNOSIS — I5020 Unspecified systolic (congestive) heart failure: Secondary | ICD-10-CM | POA: Diagnosis not present

## 2018-10-31 DIAGNOSIS — N319 Neuromuscular dysfunction of bladder, unspecified: Secondary | ICD-10-CM | POA: Diagnosis not present

## 2018-10-31 DIAGNOSIS — Z7982 Long term (current) use of aspirin: Secondary | ICD-10-CM | POA: Diagnosis not present

## 2018-10-31 DIAGNOSIS — L97428 Non-pressure chronic ulcer of left heel and midfoot with other specified severity: Secondary | ICD-10-CM | POA: Diagnosis not present

## 2018-10-31 DIAGNOSIS — I2510 Atherosclerotic heart disease of native coronary artery without angina pectoris: Secondary | ICD-10-CM | POA: Diagnosis not present

## 2018-10-31 DIAGNOSIS — L97418 Non-pressure chronic ulcer of right heel and midfoot with other specified severity: Secondary | ICD-10-CM | POA: Diagnosis not present

## 2018-10-31 DIAGNOSIS — F17210 Nicotine dependence, cigarettes, uncomplicated: Secondary | ICD-10-CM | POA: Diagnosis not present

## 2018-10-31 DIAGNOSIS — Z4800 Encounter for change or removal of nonsurgical wound dressing: Secondary | ICD-10-CM | POA: Diagnosis not present

## 2018-10-31 DIAGNOSIS — Z794 Long term (current) use of insulin: Secondary | ICD-10-CM | POA: Diagnosis not present

## 2018-10-31 DIAGNOSIS — E11621 Type 2 diabetes mellitus with foot ulcer: Secondary | ICD-10-CM | POA: Diagnosis not present

## 2018-10-31 DIAGNOSIS — Z955 Presence of coronary angioplasty implant and graft: Secondary | ICD-10-CM | POA: Diagnosis not present

## 2018-10-31 DIAGNOSIS — Z8673 Personal history of transient ischemic attack (TIA), and cerebral infarction without residual deficits: Secondary | ICD-10-CM | POA: Diagnosis not present

## 2018-10-31 DIAGNOSIS — E1151 Type 2 diabetes mellitus with diabetic peripheral angiopathy without gangrene: Secondary | ICD-10-CM | POA: Diagnosis not present

## 2018-10-31 DIAGNOSIS — N189 Chronic kidney disease, unspecified: Secondary | ICD-10-CM | POA: Diagnosis not present

## 2018-10-31 DIAGNOSIS — I70203 Unspecified atherosclerosis of native arteries of extremities, bilateral legs: Secondary | ICD-10-CM | POA: Diagnosis not present

## 2018-10-31 DIAGNOSIS — I129 Hypertensive chronic kidney disease with stage 1 through stage 4 chronic kidney disease, or unspecified chronic kidney disease: Secondary | ICD-10-CM | POA: Diagnosis not present

## 2018-10-31 DIAGNOSIS — K592 Neurogenic bowel, not elsewhere classified: Secondary | ICD-10-CM | POA: Diagnosis not present

## 2018-10-31 DIAGNOSIS — Z79891 Long term (current) use of opiate analgesic: Secondary | ICD-10-CM | POA: Diagnosis not present

## 2018-10-31 DIAGNOSIS — E1122 Type 2 diabetes mellitus with diabetic chronic kidney disease: Secondary | ICD-10-CM | POA: Diagnosis not present

## 2018-11-01 DIAGNOSIS — I129 Hypertensive chronic kidney disease with stage 1 through stage 4 chronic kidney disease, or unspecified chronic kidney disease: Secondary | ICD-10-CM | POA: Diagnosis not present

## 2018-11-01 DIAGNOSIS — L97428 Non-pressure chronic ulcer of left heel and midfoot with other specified severity: Secondary | ICD-10-CM | POA: Diagnosis not present

## 2018-11-01 DIAGNOSIS — I2510 Atherosclerotic heart disease of native coronary artery without angina pectoris: Secondary | ICD-10-CM | POA: Diagnosis not present

## 2018-11-01 DIAGNOSIS — L97418 Non-pressure chronic ulcer of right heel and midfoot with other specified severity: Secondary | ICD-10-CM | POA: Diagnosis not present

## 2018-11-01 DIAGNOSIS — I5020 Unspecified systolic (congestive) heart failure: Secondary | ICD-10-CM | POA: Diagnosis not present

## 2018-11-01 DIAGNOSIS — E11621 Type 2 diabetes mellitus with foot ulcer: Secondary | ICD-10-CM | POA: Diagnosis not present

## 2018-11-08 DIAGNOSIS — I5020 Unspecified systolic (congestive) heart failure: Secondary | ICD-10-CM | POA: Diagnosis not present

## 2018-11-08 DIAGNOSIS — L97418 Non-pressure chronic ulcer of right heel and midfoot with other specified severity: Secondary | ICD-10-CM | POA: Diagnosis not present

## 2018-11-08 DIAGNOSIS — I129 Hypertensive chronic kidney disease with stage 1 through stage 4 chronic kidney disease, or unspecified chronic kidney disease: Secondary | ICD-10-CM | POA: Diagnosis not present

## 2018-11-08 DIAGNOSIS — L97428 Non-pressure chronic ulcer of left heel and midfoot with other specified severity: Secondary | ICD-10-CM | POA: Diagnosis not present

## 2018-11-08 DIAGNOSIS — E11621 Type 2 diabetes mellitus with foot ulcer: Secondary | ICD-10-CM | POA: Diagnosis not present

## 2018-11-08 DIAGNOSIS — I2510 Atherosclerotic heart disease of native coronary artery without angina pectoris: Secondary | ICD-10-CM | POA: Diagnosis not present

## 2018-11-12 DIAGNOSIS — M797 Fibromyalgia: Secondary | ICD-10-CM | POA: Diagnosis not present

## 2018-11-12 DIAGNOSIS — E119 Type 2 diabetes mellitus without complications: Secondary | ICD-10-CM | POA: Diagnosis not present

## 2018-11-12 DIAGNOSIS — I10 Essential (primary) hypertension: Secondary | ICD-10-CM | POA: Diagnosis not present

## 2018-11-12 DIAGNOSIS — M5440 Lumbago with sciatica, unspecified side: Secondary | ICD-10-CM | POA: Diagnosis not present

## 2018-11-12 DIAGNOSIS — M961 Postlaminectomy syndrome, not elsewhere classified: Secondary | ICD-10-CM | POA: Diagnosis not present

## 2018-11-13 DIAGNOSIS — I10 Essential (primary) hypertension: Secondary | ICD-10-CM | POA: Diagnosis not present

## 2018-11-13 DIAGNOSIS — I5020 Unspecified systolic (congestive) heart failure: Secondary | ICD-10-CM | POA: Diagnosis not present

## 2018-11-13 DIAGNOSIS — I739 Peripheral vascular disease, unspecified: Secondary | ICD-10-CM | POA: Diagnosis not present

## 2018-11-13 DIAGNOSIS — E119 Type 2 diabetes mellitus without complications: Secondary | ICD-10-CM | POA: Diagnosis not present

## 2018-11-13 DIAGNOSIS — I2510 Atherosclerotic heart disease of native coronary artery without angina pectoris: Secondary | ICD-10-CM | POA: Diagnosis not present

## 2018-11-15 DIAGNOSIS — L97418 Non-pressure chronic ulcer of right heel and midfoot with other specified severity: Secondary | ICD-10-CM | POA: Diagnosis not present

## 2018-11-15 DIAGNOSIS — L97428 Non-pressure chronic ulcer of left heel and midfoot with other specified severity: Secondary | ICD-10-CM | POA: Diagnosis not present

## 2018-11-15 DIAGNOSIS — I5020 Unspecified systolic (congestive) heart failure: Secondary | ICD-10-CM | POA: Diagnosis not present

## 2018-11-15 DIAGNOSIS — I2510 Atherosclerotic heart disease of native coronary artery without angina pectoris: Secondary | ICD-10-CM | POA: Diagnosis not present

## 2018-11-15 DIAGNOSIS — I129 Hypertensive chronic kidney disease with stage 1 through stage 4 chronic kidney disease, or unspecified chronic kidney disease: Secondary | ICD-10-CM | POA: Diagnosis not present

## 2018-11-15 DIAGNOSIS — E11621 Type 2 diabetes mellitus with foot ulcer: Secondary | ICD-10-CM | POA: Diagnosis not present

## 2018-11-21 DIAGNOSIS — I5020 Unspecified systolic (congestive) heart failure: Secondary | ICD-10-CM | POA: Diagnosis not present

## 2018-11-21 DIAGNOSIS — L97418 Non-pressure chronic ulcer of right heel and midfoot with other specified severity: Secondary | ICD-10-CM | POA: Diagnosis not present

## 2018-11-21 DIAGNOSIS — L97428 Non-pressure chronic ulcer of left heel and midfoot with other specified severity: Secondary | ICD-10-CM | POA: Diagnosis not present

## 2018-11-21 DIAGNOSIS — E11621 Type 2 diabetes mellitus with foot ulcer: Secondary | ICD-10-CM | POA: Diagnosis not present

## 2018-11-21 DIAGNOSIS — I129 Hypertensive chronic kidney disease with stage 1 through stage 4 chronic kidney disease, or unspecified chronic kidney disease: Secondary | ICD-10-CM | POA: Diagnosis not present

## 2018-11-21 DIAGNOSIS — I5022 Chronic systolic (congestive) heart failure: Secondary | ICD-10-CM | POA: Diagnosis not present

## 2018-11-21 DIAGNOSIS — I2510 Atherosclerotic heart disease of native coronary artery without angina pectoris: Secondary | ICD-10-CM | POA: Diagnosis not present

## 2018-11-29 DIAGNOSIS — E11621 Type 2 diabetes mellitus with foot ulcer: Secondary | ICD-10-CM | POA: Diagnosis not present

## 2018-11-29 DIAGNOSIS — I2510 Atherosclerotic heart disease of native coronary artery without angina pectoris: Secondary | ICD-10-CM | POA: Diagnosis not present

## 2018-11-29 DIAGNOSIS — I5020 Unspecified systolic (congestive) heart failure: Secondary | ICD-10-CM | POA: Diagnosis not present

## 2018-11-29 DIAGNOSIS — I129 Hypertensive chronic kidney disease with stage 1 through stage 4 chronic kidney disease, or unspecified chronic kidney disease: Secondary | ICD-10-CM | POA: Diagnosis not present

## 2018-11-29 DIAGNOSIS — L97418 Non-pressure chronic ulcer of right heel and midfoot with other specified severity: Secondary | ICD-10-CM | POA: Diagnosis not present

## 2018-11-29 DIAGNOSIS — L97428 Non-pressure chronic ulcer of left heel and midfoot with other specified severity: Secondary | ICD-10-CM | POA: Diagnosis not present

## 2018-11-30 DIAGNOSIS — E1122 Type 2 diabetes mellitus with diabetic chronic kidney disease: Secondary | ICD-10-CM | POA: Diagnosis not present

## 2018-11-30 DIAGNOSIS — N319 Neuromuscular dysfunction of bladder, unspecified: Secondary | ICD-10-CM | POA: Diagnosis not present

## 2018-11-30 DIAGNOSIS — E1151 Type 2 diabetes mellitus with diabetic peripheral angiopathy without gangrene: Secondary | ICD-10-CM | POA: Diagnosis not present

## 2018-11-30 DIAGNOSIS — Z955 Presence of coronary angioplasty implant and graft: Secondary | ICD-10-CM | POA: Diagnosis not present

## 2018-11-30 DIAGNOSIS — L97418 Non-pressure chronic ulcer of right heel and midfoot with other specified severity: Secondary | ICD-10-CM | POA: Diagnosis not present

## 2018-11-30 DIAGNOSIS — L97428 Non-pressure chronic ulcer of left heel and midfoot with other specified severity: Secondary | ICD-10-CM | POA: Diagnosis not present

## 2018-11-30 DIAGNOSIS — Z8673 Personal history of transient ischemic attack (TIA), and cerebral infarction without residual deficits: Secondary | ICD-10-CM | POA: Diagnosis not present

## 2018-11-30 DIAGNOSIS — Z7982 Long term (current) use of aspirin: Secondary | ICD-10-CM | POA: Diagnosis not present

## 2018-11-30 DIAGNOSIS — Z79891 Long term (current) use of opiate analgesic: Secondary | ICD-10-CM | POA: Diagnosis not present

## 2018-11-30 DIAGNOSIS — I2510 Atherosclerotic heart disease of native coronary artery without angina pectoris: Secondary | ICD-10-CM | POA: Diagnosis not present

## 2018-11-30 DIAGNOSIS — Z794 Long term (current) use of insulin: Secondary | ICD-10-CM | POA: Diagnosis not present

## 2018-11-30 DIAGNOSIS — E11621 Type 2 diabetes mellitus with foot ulcer: Secondary | ICD-10-CM | POA: Diagnosis not present

## 2018-11-30 DIAGNOSIS — F17210 Nicotine dependence, cigarettes, uncomplicated: Secondary | ICD-10-CM | POA: Diagnosis not present

## 2018-11-30 DIAGNOSIS — K592 Neurogenic bowel, not elsewhere classified: Secondary | ICD-10-CM | POA: Diagnosis not present

## 2018-11-30 DIAGNOSIS — Z4800 Encounter for change or removal of nonsurgical wound dressing: Secondary | ICD-10-CM | POA: Diagnosis not present

## 2018-11-30 DIAGNOSIS — I70203 Unspecified atherosclerosis of native arteries of extremities, bilateral legs: Secondary | ICD-10-CM | POA: Diagnosis not present

## 2018-11-30 DIAGNOSIS — I5020 Unspecified systolic (congestive) heart failure: Secondary | ICD-10-CM | POA: Diagnosis not present

## 2018-11-30 DIAGNOSIS — I129 Hypertensive chronic kidney disease with stage 1 through stage 4 chronic kidney disease, or unspecified chronic kidney disease: Secondary | ICD-10-CM | POA: Diagnosis not present

## 2018-11-30 DIAGNOSIS — N189 Chronic kidney disease, unspecified: Secondary | ICD-10-CM | POA: Diagnosis not present

## 2018-12-03 DIAGNOSIS — M961 Postlaminectomy syndrome, not elsewhere classified: Secondary | ICD-10-CM | POA: Diagnosis not present

## 2018-12-03 DIAGNOSIS — M797 Fibromyalgia: Secondary | ICD-10-CM | POA: Diagnosis not present

## 2018-12-03 DIAGNOSIS — E119 Type 2 diabetes mellitus without complications: Secondary | ICD-10-CM | POA: Diagnosis not present

## 2018-12-03 DIAGNOSIS — F341 Dysthymic disorder: Secondary | ICD-10-CM | POA: Diagnosis not present

## 2018-12-06 DIAGNOSIS — L97428 Non-pressure chronic ulcer of left heel and midfoot with other specified severity: Secondary | ICD-10-CM | POA: Diagnosis not present

## 2018-12-06 DIAGNOSIS — I2510 Atherosclerotic heart disease of native coronary artery without angina pectoris: Secondary | ICD-10-CM | POA: Diagnosis not present

## 2018-12-06 DIAGNOSIS — I129 Hypertensive chronic kidney disease with stage 1 through stage 4 chronic kidney disease, or unspecified chronic kidney disease: Secondary | ICD-10-CM | POA: Diagnosis not present

## 2018-12-06 DIAGNOSIS — I5020 Unspecified systolic (congestive) heart failure: Secondary | ICD-10-CM | POA: Diagnosis not present

## 2018-12-06 DIAGNOSIS — E11621 Type 2 diabetes mellitus with foot ulcer: Secondary | ICD-10-CM | POA: Diagnosis not present

## 2018-12-06 DIAGNOSIS — L97418 Non-pressure chronic ulcer of right heel and midfoot with other specified severity: Secondary | ICD-10-CM | POA: Diagnosis not present

## 2018-12-11 DIAGNOSIS — L97418 Non-pressure chronic ulcer of right heel and midfoot with other specified severity: Secondary | ICD-10-CM | POA: Diagnosis not present

## 2018-12-11 DIAGNOSIS — I129 Hypertensive chronic kidney disease with stage 1 through stage 4 chronic kidney disease, or unspecified chronic kidney disease: Secondary | ICD-10-CM | POA: Diagnosis not present

## 2018-12-11 DIAGNOSIS — I2510 Atherosclerotic heart disease of native coronary artery without angina pectoris: Secondary | ICD-10-CM | POA: Diagnosis not present

## 2018-12-11 DIAGNOSIS — I5020 Unspecified systolic (congestive) heart failure: Secondary | ICD-10-CM | POA: Diagnosis not present

## 2018-12-11 DIAGNOSIS — E11621 Type 2 diabetes mellitus with foot ulcer: Secondary | ICD-10-CM | POA: Diagnosis not present

## 2018-12-11 DIAGNOSIS — L97428 Non-pressure chronic ulcer of left heel and midfoot with other specified severity: Secondary | ICD-10-CM | POA: Diagnosis not present

## 2018-12-12 DIAGNOSIS — S91301A Unspecified open wound, right foot, initial encounter: Secondary | ICD-10-CM | POA: Diagnosis not present

## 2018-12-12 DIAGNOSIS — I739 Peripheral vascular disease, unspecified: Secondary | ICD-10-CM | POA: Diagnosis not present

## 2018-12-12 DIAGNOSIS — S91302A Unspecified open wound, left foot, initial encounter: Secondary | ICD-10-CM | POA: Diagnosis not present

## 2018-12-13 DIAGNOSIS — E11621 Type 2 diabetes mellitus with foot ulcer: Secondary | ICD-10-CM | POA: Diagnosis not present

## 2018-12-13 DIAGNOSIS — I5022 Chronic systolic (congestive) heart failure: Secondary | ICD-10-CM | POA: Diagnosis not present

## 2018-12-13 DIAGNOSIS — I252 Old myocardial infarction: Secondary | ICD-10-CM | POA: Diagnosis not present

## 2018-12-13 DIAGNOSIS — I739 Peripheral vascular disease, unspecified: Secondary | ICD-10-CM | POA: Diagnosis not present

## 2018-12-13 DIAGNOSIS — I63332 Cerebral infarction due to thrombosis of left posterior cerebral artery: Secondary | ICD-10-CM | POA: Diagnosis not present

## 2018-12-13 DIAGNOSIS — M797 Fibromyalgia: Secondary | ICD-10-CM | POA: Diagnosis not present

## 2018-12-13 DIAGNOSIS — I2510 Atherosclerotic heart disease of native coronary artery without angina pectoris: Secondary | ICD-10-CM | POA: Diagnosis not present

## 2018-12-13 DIAGNOSIS — I10 Essential (primary) hypertension: Secondary | ICD-10-CM | POA: Diagnosis not present

## 2018-12-13 DIAGNOSIS — G629 Polyneuropathy, unspecified: Secondary | ICD-10-CM | POA: Diagnosis not present

## 2018-12-13 DIAGNOSIS — L97509 Non-pressure chronic ulcer of other part of unspecified foot with unspecified severity: Secondary | ICD-10-CM | POA: Diagnosis not present

## 2018-12-13 DIAGNOSIS — F17200 Nicotine dependence, unspecified, uncomplicated: Secondary | ICD-10-CM | POA: Diagnosis not present

## 2018-12-13 DIAGNOSIS — N319 Neuromuscular dysfunction of bladder, unspecified: Secondary | ICD-10-CM | POA: Diagnosis not present

## 2018-12-20 DIAGNOSIS — I2510 Atherosclerotic heart disease of native coronary artery without angina pectoris: Secondary | ICD-10-CM | POA: Diagnosis not present

## 2018-12-20 DIAGNOSIS — I5020 Unspecified systolic (congestive) heart failure: Secondary | ICD-10-CM | POA: Diagnosis not present

## 2018-12-20 DIAGNOSIS — E11621 Type 2 diabetes mellitus with foot ulcer: Secondary | ICD-10-CM | POA: Diagnosis not present

## 2018-12-20 DIAGNOSIS — I129 Hypertensive chronic kidney disease with stage 1 through stage 4 chronic kidney disease, or unspecified chronic kidney disease: Secondary | ICD-10-CM | POA: Diagnosis not present

## 2018-12-20 DIAGNOSIS — L97428 Non-pressure chronic ulcer of left heel and midfoot with other specified severity: Secondary | ICD-10-CM | POA: Diagnosis not present

## 2018-12-20 DIAGNOSIS — L97418 Non-pressure chronic ulcer of right heel and midfoot with other specified severity: Secondary | ICD-10-CM | POA: Diagnosis not present

## 2018-12-23 DIAGNOSIS — Z01812 Encounter for preprocedural laboratory examination: Secondary | ICD-10-CM | POA: Diagnosis not present

## 2018-12-23 DIAGNOSIS — Z20828 Contact with and (suspected) exposure to other viral communicable diseases: Secondary | ICD-10-CM | POA: Diagnosis not present

## 2018-12-25 DIAGNOSIS — I252 Old myocardial infarction: Secondary | ICD-10-CM | POA: Diagnosis not present

## 2018-12-25 DIAGNOSIS — Z8673 Personal history of transient ischemic attack (TIA), and cerebral infarction without residual deficits: Secondary | ICD-10-CM | POA: Diagnosis not present

## 2018-12-25 DIAGNOSIS — L97429 Non-pressure chronic ulcer of left heel and midfoot with unspecified severity: Secondary | ICD-10-CM | POA: Diagnosis not present

## 2018-12-25 DIAGNOSIS — E1140 Type 2 diabetes mellitus with diabetic neuropathy, unspecified: Secondary | ICD-10-CM | POA: Diagnosis not present

## 2018-12-25 DIAGNOSIS — Z794 Long term (current) use of insulin: Secondary | ICD-10-CM | POA: Diagnosis not present

## 2018-12-25 DIAGNOSIS — I2510 Atherosclerotic heart disease of native coronary artery without angina pectoris: Secondary | ICD-10-CM | POA: Diagnosis not present

## 2018-12-25 DIAGNOSIS — I771 Stricture of artery: Secondary | ICD-10-CM | POA: Diagnosis not present

## 2018-12-25 DIAGNOSIS — F17210 Nicotine dependence, cigarettes, uncomplicated: Secondary | ICD-10-CM | POA: Diagnosis not present

## 2018-12-25 DIAGNOSIS — I739 Peripheral vascular disease, unspecified: Secondary | ICD-10-CM | POA: Diagnosis not present

## 2018-12-25 DIAGNOSIS — L97419 Non-pressure chronic ulcer of right heel and midfoot with unspecified severity: Secondary | ICD-10-CM | POA: Diagnosis not present

## 2018-12-25 DIAGNOSIS — Z955 Presence of coronary angioplasty implant and graft: Secondary | ICD-10-CM | POA: Diagnosis not present

## 2018-12-25 DIAGNOSIS — Z95810 Presence of automatic (implantable) cardiac defibrillator: Secondary | ICD-10-CM | POA: Diagnosis not present

## 2018-12-25 DIAGNOSIS — I70244 Atherosclerosis of native arteries of left leg with ulceration of heel and midfoot: Secondary | ICD-10-CM | POA: Diagnosis not present

## 2018-12-25 DIAGNOSIS — I110 Hypertensive heart disease with heart failure: Secondary | ICD-10-CM | POA: Diagnosis not present

## 2018-12-25 DIAGNOSIS — E1151 Type 2 diabetes mellitus with diabetic peripheral angiopathy without gangrene: Secondary | ICD-10-CM | POA: Diagnosis not present

## 2018-12-25 DIAGNOSIS — I70234 Atherosclerosis of native arteries of right leg with ulceration of heel and midfoot: Secondary | ICD-10-CM | POA: Diagnosis not present

## 2018-12-25 DIAGNOSIS — I5022 Chronic systolic (congestive) heart failure: Secondary | ICD-10-CM | POA: Diagnosis not present

## 2018-12-25 DIAGNOSIS — I998 Other disorder of circulatory system: Secondary | ICD-10-CM | POA: Diagnosis not present

## 2018-12-27 DIAGNOSIS — I5020 Unspecified systolic (congestive) heart failure: Secondary | ICD-10-CM | POA: Diagnosis not present

## 2018-12-27 DIAGNOSIS — L97428 Non-pressure chronic ulcer of left heel and midfoot with other specified severity: Secondary | ICD-10-CM | POA: Diagnosis not present

## 2018-12-27 DIAGNOSIS — I129 Hypertensive chronic kidney disease with stage 1 through stage 4 chronic kidney disease, or unspecified chronic kidney disease: Secondary | ICD-10-CM | POA: Diagnosis not present

## 2018-12-27 DIAGNOSIS — L97418 Non-pressure chronic ulcer of right heel and midfoot with other specified severity: Secondary | ICD-10-CM | POA: Diagnosis not present

## 2018-12-27 DIAGNOSIS — E11621 Type 2 diabetes mellitus with foot ulcer: Secondary | ICD-10-CM | POA: Diagnosis not present

## 2018-12-27 DIAGNOSIS — I2510 Atherosclerotic heart disease of native coronary artery without angina pectoris: Secondary | ICD-10-CM | POA: Diagnosis not present

## 2019-01-07 IMAGING — RF DG ESOPHAGUS
12 of 15 series · 14 of 24 positions shown · non-contrast
Comparison: None.

CLINICAL DATA: Dysphagia

EXAM:
ESOPHOGRAM / BARIUM SWALLOW / BARIUM TABLET STUDY
TECHNIQUE: Combined double contrast and single contrast examination performed
using effervescent crystals, thick barium liquid, and thin barium
liquid. The patient was observed with fluoroscopy swallowing a 13 mm
barium sulphate tablet.
FLUOROSCOPY TIME:  Fluoroscopy Time:  1.1 minute
Radiation Exposure Index (if provided by the fluoroscopic device):
8.7 mGy
Number of Acquired Spot Images: 0

[Series 1: cp_standard · 0.25mm/px · 2 acquisitions, 2 frames shown (1 of 12)]
[im 1/2]
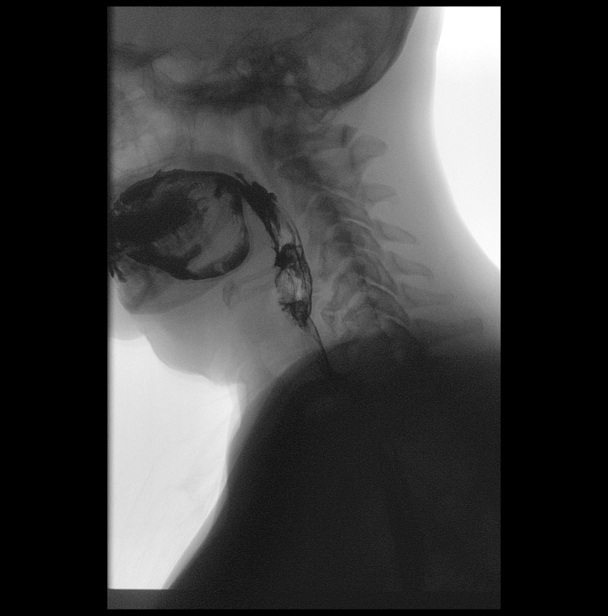
[im 2/2]
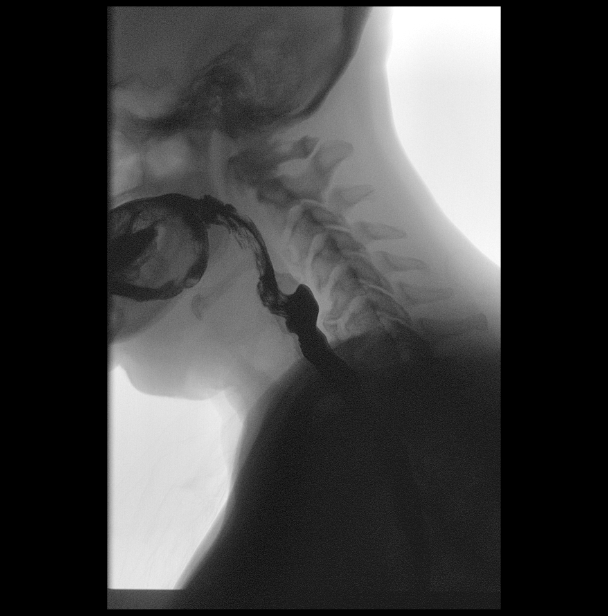

[Series 3: cp_standard · 0.25mm/px · 1 of 57 frames shown (2 of 12)]
[frame 29/57]
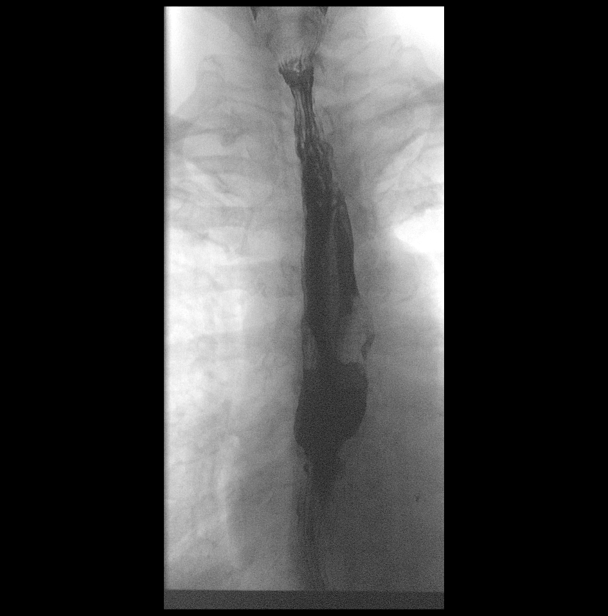

[Series 4: cp_standard · 0.25mm/px · 1 of 6 frames shown (3 of 12)]
[frame 6/6]
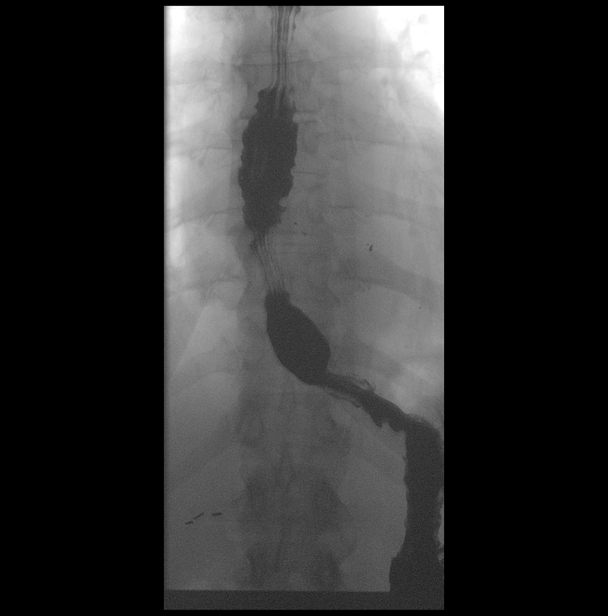

[Series 5: cp_standard · 0.25mm/px · 1 of 1 slices shown (4 of 12)]
[im 1/1]
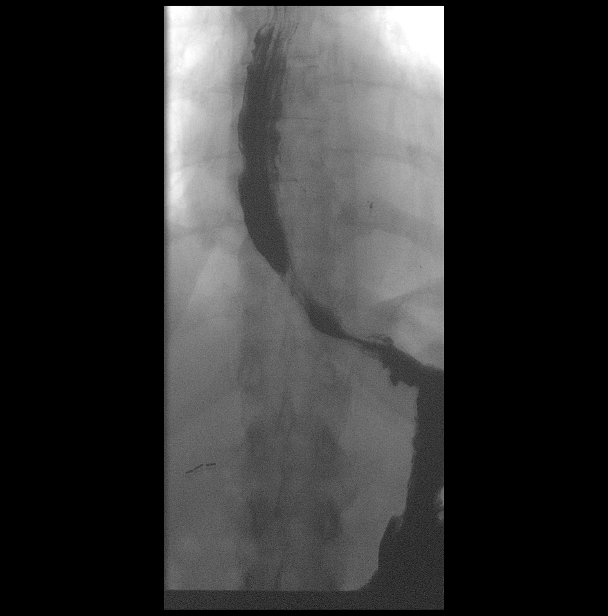

[Series 7: cp_standard · 0.27mm/px · 1 of 46 frames shown (5 of 12)]
[frame 27/46]
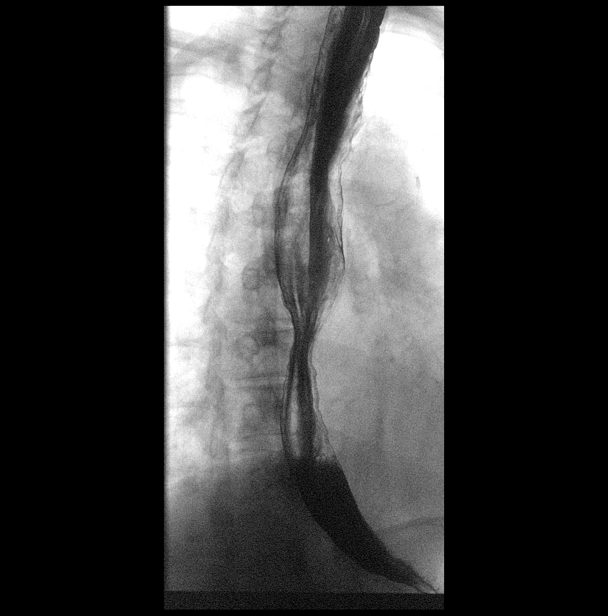

[Series 9: cp_standard · 0.26mm/px · 2 of 17 frames shown (6 of 12)]
[frame 9/17]
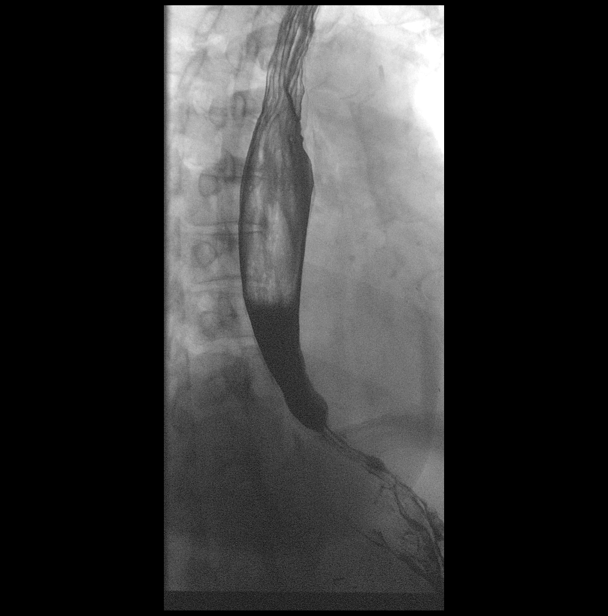
[frame 15/17]
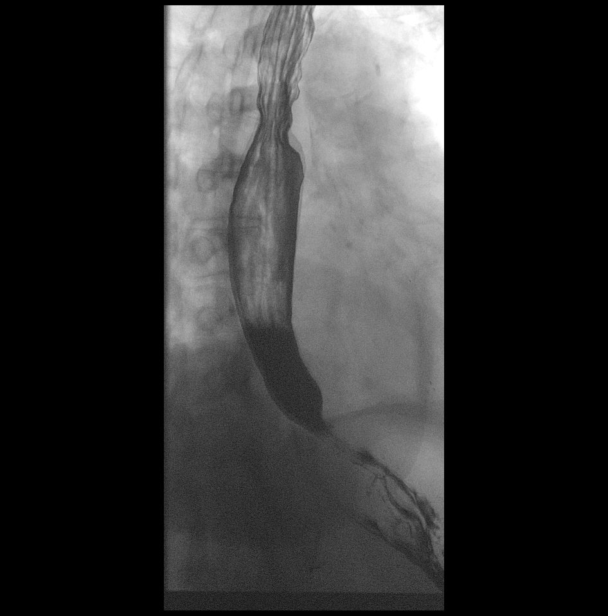

[Series 10: cp_standard · 0.26mm/px · 1 of 18 frames shown (7 of 12)]
[frame 16/18]
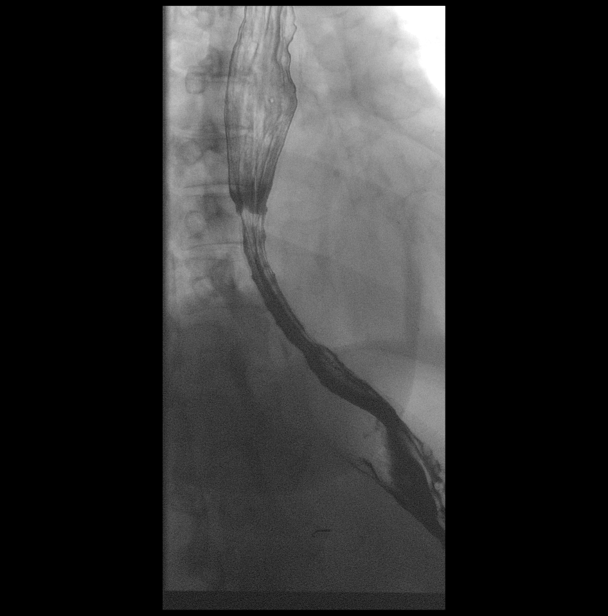

[Series 11: cp_standard · 0.26mm/px · 1 of 25 frames shown (8 of 12)]
[frame 22/25]
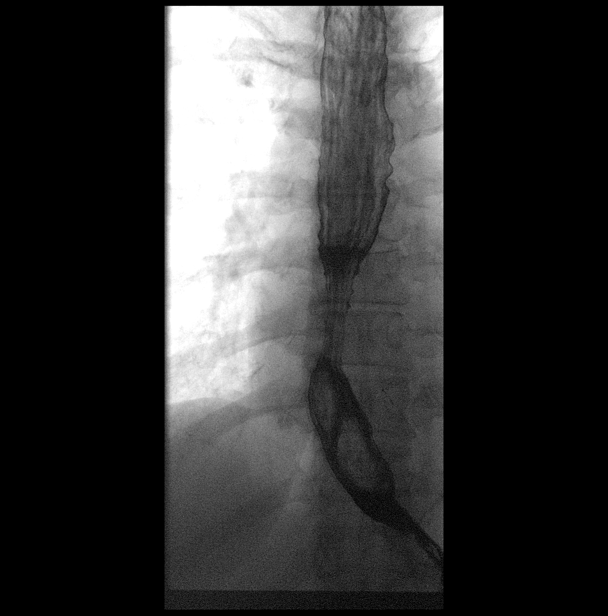

[Series 12: cp_standard · 0.26mm/px · 1 of 39 frames shown (9 of 12)]
[frame 20/39]
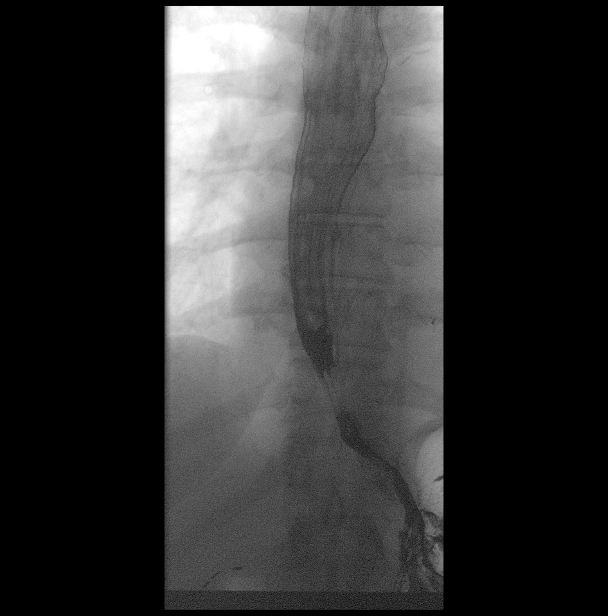

[Series 13: cp_standard · 0.29mm/px · 1 of 1 slices shown (10 of 12)]
[im 1/1]
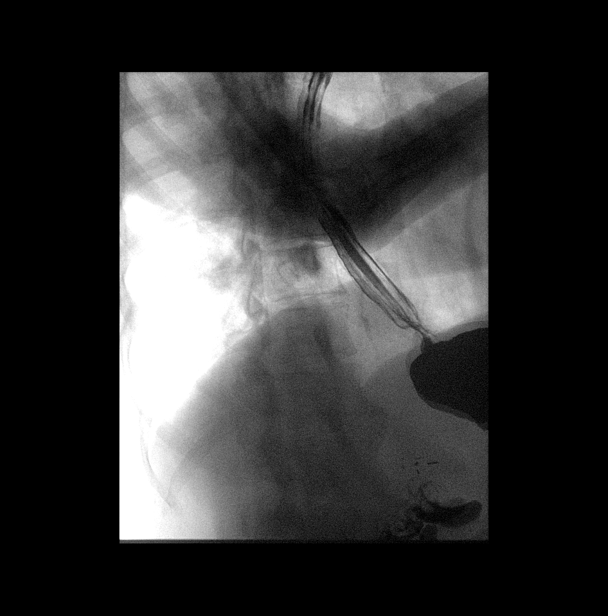

[Series 16: cp_standard · 0.30mm/px · 1 of 1 slices shown (11 of 12)]
[im 1/1]
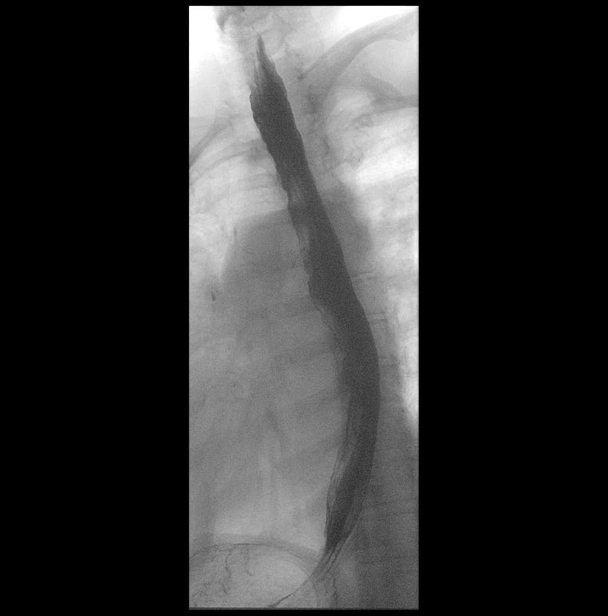

[Series 20: cp_standard · 0.26mm/px · 1 of 1 slices shown (12 of 12)]
[im 1/1]
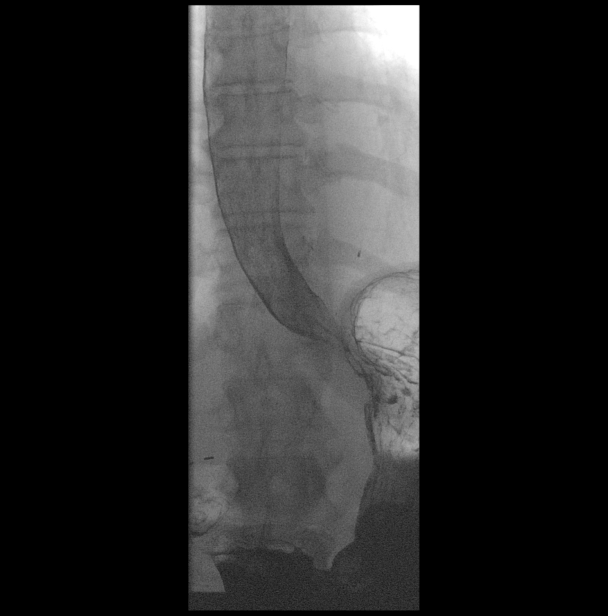

[14 of 24 positions shown; findings below may reference images not displayed]

FINDINGS: There was normal pharyngeal anatomy and motility. Contrast flowed
freely through the esophagus without evidence of stricture or mass.
There was normal esophageal mucosa without evidence of irregularity
or ulceration. Tertiary contractions of the mid and distal esophagus
throughout the examination. Mild gastroesophageal reflux. No
definite hiatal hernia was demonstrated.

At the end of the examination a 13 mm barium tablet was administered
which transited through the esophagus and esophagogastric junction
without delay.
IMPRESSION: 1. No recurrent esophageal stricture.
2. Tertiary contractions of the mid and distal esophagus throughout
the examination as can be seen with esophageal spasm.
3. Mild gastroesophageal reflux.

## 2019-01-21 DIAGNOSIS — M5440 Lumbago with sciatica, unspecified side: Secondary | ICD-10-CM | POA: Diagnosis not present

## 2019-01-21 DIAGNOSIS — E1143 Type 2 diabetes mellitus with diabetic autonomic (poly)neuropathy: Secondary | ICD-10-CM | POA: Diagnosis not present

## 2019-01-21 DIAGNOSIS — I059 Rheumatic mitral valve disease, unspecified: Secondary | ICD-10-CM | POA: Diagnosis not present

## 2019-01-21 DIAGNOSIS — I2510 Atherosclerotic heart disease of native coronary artery without angina pectoris: Secondary | ICD-10-CM | POA: Diagnosis not present

## 2019-02-02 IMAGING — CR DG ABDOMEN 1V
2 series · 2 of 2 positions shown · non-contrast
Comparison: Prior CT from 11/09/2010

CLINICAL DATA: Initial evaluation for acute abdominal distension.

EXAM:
ABDOMEN - 1 VIEW

[abdomen kub (1 of 2)]
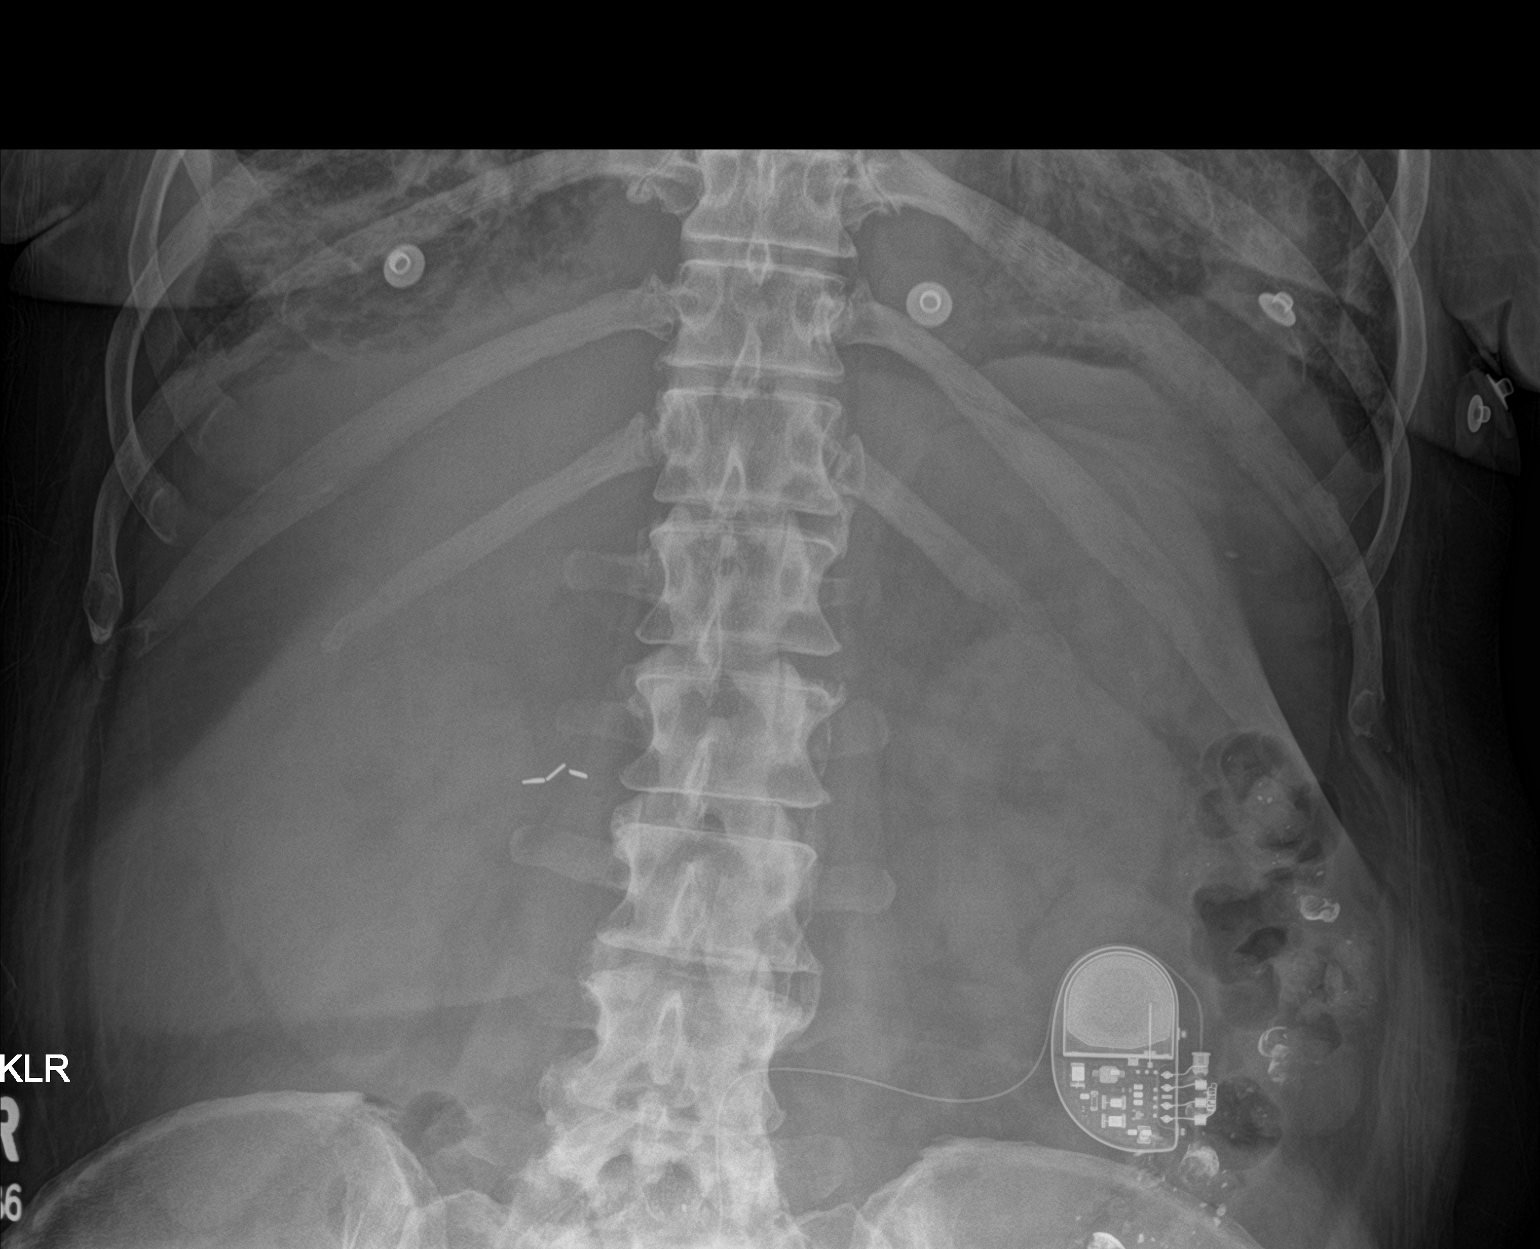

[abdomen kub (2 of 2)]
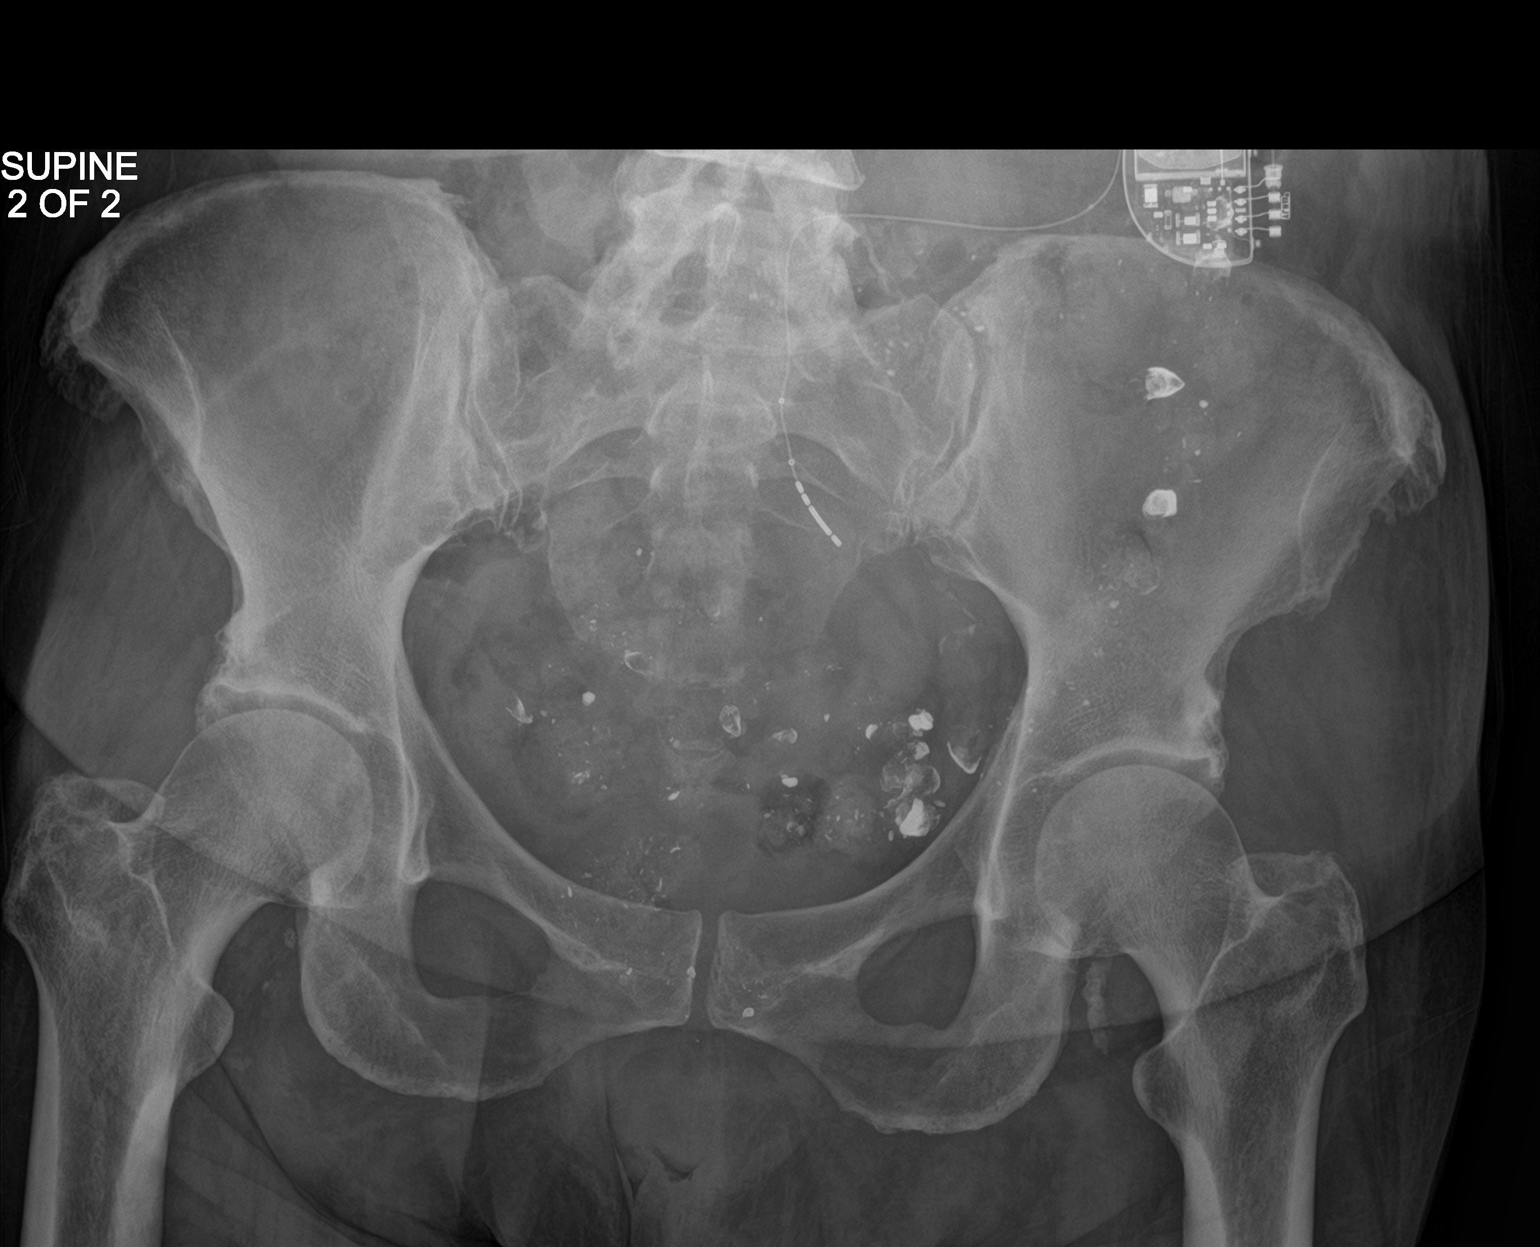

[2 of 2 positions shown; findings below may reference images not displayed]

FINDINGS: Bowel gas pattern within normal limits without obstruction or ileus.
Scattered retain enteric contrast material within the distal colon
and rectum. Overall stool burden is mild. No appreciable abnormal
bowel wall thickening. No free air. No soft tissue mass or abnormal
calcification.

Generator for sacral stimulator overlies the left lower quadrant. No
acute osseous abnormality. Osteoarthritic changes noted about the
hips.
IMPRESSION: Nonobstructive bowel gas pattern with no radiographic evidence for
acute intra-abdominal process.

## 2019-02-06 DIAGNOSIS — E11622 Type 2 diabetes mellitus with other skin ulcer: Secondary | ICD-10-CM | POA: Diagnosis not present

## 2019-02-06 DIAGNOSIS — E1151 Type 2 diabetes mellitus with diabetic peripheral angiopathy without gangrene: Secondary | ICD-10-CM | POA: Diagnosis not present

## 2019-02-06 DIAGNOSIS — I130 Hypertensive heart and chronic kidney disease with heart failure and stage 1 through stage 4 chronic kidney disease, or unspecified chronic kidney disease: Secondary | ICD-10-CM | POA: Diagnosis not present

## 2019-02-06 DIAGNOSIS — I959 Hypotension, unspecified: Secondary | ICD-10-CM | POA: Diagnosis not present

## 2019-02-06 DIAGNOSIS — Z955 Presence of coronary angioplasty implant and graft: Secondary | ICD-10-CM | POA: Diagnosis not present

## 2019-02-06 DIAGNOSIS — Z8673 Personal history of transient ischemic attack (TIA), and cerebral infarction without residual deficits: Secondary | ICD-10-CM | POA: Diagnosis not present

## 2019-02-06 DIAGNOSIS — I252 Old myocardial infarction: Secondary | ICD-10-CM | POA: Diagnosis not present

## 2019-02-06 DIAGNOSIS — L97909 Non-pressure chronic ulcer of unspecified part of unspecified lower leg with unspecified severity: Secondary | ICD-10-CM | POA: Diagnosis not present

## 2019-02-06 DIAGNOSIS — Z7902 Long term (current) use of antithrombotics/antiplatelets: Secondary | ICD-10-CM | POA: Diagnosis not present

## 2019-02-06 DIAGNOSIS — Z9049 Acquired absence of other specified parts of digestive tract: Secondary | ICD-10-CM | POA: Diagnosis not present

## 2019-02-06 DIAGNOSIS — N189 Chronic kidney disease, unspecified: Secondary | ICD-10-CM | POA: Diagnosis not present

## 2019-02-06 DIAGNOSIS — Z7982 Long term (current) use of aspirin: Secondary | ICD-10-CM | POA: Diagnosis not present

## 2019-02-06 DIAGNOSIS — Z7984 Long term (current) use of oral hypoglycemic drugs: Secondary | ICD-10-CM | POA: Diagnosis not present

## 2019-02-06 DIAGNOSIS — E1122 Type 2 diabetes mellitus with diabetic chronic kidney disease: Secondary | ICD-10-CM | POA: Diagnosis not present

## 2019-02-06 DIAGNOSIS — F17210 Nicotine dependence, cigarettes, uncomplicated: Secondary | ICD-10-CM | POA: Diagnosis not present

## 2019-02-06 DIAGNOSIS — M797 Fibromyalgia: Secondary | ICD-10-CM | POA: Diagnosis not present

## 2019-02-06 DIAGNOSIS — I2510 Atherosclerotic heart disease of native coronary artery without angina pectoris: Secondary | ICD-10-CM | POA: Diagnosis not present

## 2019-02-06 DIAGNOSIS — I5022 Chronic systolic (congestive) heart failure: Secondary | ICD-10-CM | POA: Diagnosis not present

## 2019-02-11 DIAGNOSIS — I110 Hypertensive heart disease with heart failure: Secondary | ICD-10-CM | POA: Diagnosis not present

## 2019-02-11 DIAGNOSIS — Z9049 Acquired absence of other specified parts of digestive tract: Secondary | ICD-10-CM | POA: Diagnosis not present

## 2019-02-11 DIAGNOSIS — Z955 Presence of coronary angioplasty implant and graft: Secondary | ICD-10-CM | POA: Diagnosis not present

## 2019-02-11 DIAGNOSIS — E1152 Type 2 diabetes mellitus with diabetic peripheral angiopathy with gangrene: Secondary | ICD-10-CM | POA: Diagnosis not present

## 2019-02-11 DIAGNOSIS — I2510 Atherosclerotic heart disease of native coronary artery without angina pectoris: Secondary | ICD-10-CM | POA: Diagnosis not present

## 2019-02-11 DIAGNOSIS — Z8673 Personal history of transient ischemic attack (TIA), and cerebral infarction without residual deficits: Secondary | ICD-10-CM | POA: Diagnosis not present

## 2019-02-11 DIAGNOSIS — I509 Heart failure, unspecified: Secondary | ICD-10-CM | POA: Diagnosis not present

## 2019-02-11 DIAGNOSIS — Z882 Allergy status to sulfonamides status: Secondary | ICD-10-CM | POA: Diagnosis not present

## 2019-02-11 DIAGNOSIS — M797 Fibromyalgia: Secondary | ICD-10-CM | POA: Diagnosis not present

## 2019-02-11 DIAGNOSIS — Z888 Allergy status to other drugs, medicaments and biological substances status: Secondary | ICD-10-CM | POA: Diagnosis not present

## 2019-03-04 DIAGNOSIS — L8990 Pressure ulcer of unspecified site, unspecified stage: Secondary | ICD-10-CM | POA: Diagnosis not present

## 2019-03-06 DIAGNOSIS — I2510 Atherosclerotic heart disease of native coronary artery without angina pectoris: Secondary | ICD-10-CM | POA: Diagnosis not present

## 2019-03-06 DIAGNOSIS — I110 Hypertensive heart disease with heart failure: Secondary | ICD-10-CM | POA: Diagnosis not present

## 2019-03-06 DIAGNOSIS — F17200 Nicotine dependence, unspecified, uncomplicated: Secondary | ICD-10-CM | POA: Diagnosis not present

## 2019-03-06 DIAGNOSIS — S91302A Unspecified open wound, left foot, initial encounter: Secondary | ICD-10-CM | POA: Diagnosis not present

## 2019-03-06 DIAGNOSIS — I739 Peripheral vascular disease, unspecified: Secondary | ICD-10-CM | POA: Diagnosis not present

## 2019-03-06 DIAGNOSIS — E11621 Type 2 diabetes mellitus with foot ulcer: Secondary | ICD-10-CM | POA: Diagnosis not present

## 2019-03-06 DIAGNOSIS — Z8673 Personal history of transient ischemic attack (TIA), and cerebral infarction without residual deficits: Secondary | ICD-10-CM | POA: Diagnosis not present

## 2019-03-06 DIAGNOSIS — L97419 Non-pressure chronic ulcer of right heel and midfoot with unspecified severity: Secondary | ICD-10-CM | POA: Diagnosis not present

## 2019-03-06 DIAGNOSIS — L97429 Non-pressure chronic ulcer of left heel and midfoot with unspecified severity: Secondary | ICD-10-CM | POA: Diagnosis not present

## 2019-03-06 DIAGNOSIS — Z95820 Peripheral vascular angioplasty status with implants and grafts: Secondary | ICD-10-CM | POA: Diagnosis not present

## 2019-03-06 DIAGNOSIS — I5020 Unspecified systolic (congestive) heart failure: Secondary | ICD-10-CM | POA: Diagnosis not present

## 2019-03-06 DIAGNOSIS — I252 Old myocardial infarction: Secondary | ICD-10-CM | POA: Diagnosis not present

## 2019-03-06 DIAGNOSIS — Z794 Long term (current) use of insulin: Secondary | ICD-10-CM | POA: Diagnosis not present

## 2019-03-26 DIAGNOSIS — D649 Anemia, unspecified: Secondary | ICD-10-CM | POA: Diagnosis not present

## 2019-03-26 DIAGNOSIS — L97429 Non-pressure chronic ulcer of left heel and midfoot with unspecified severity: Secondary | ICD-10-CM | POA: Diagnosis not present

## 2019-03-26 DIAGNOSIS — R2242 Localized swelling, mass and lump, left lower limb: Secondary | ICD-10-CM | POA: Diagnosis not present

## 2019-03-26 DIAGNOSIS — S91302A Unspecified open wound, left foot, initial encounter: Secondary | ICD-10-CM | POA: Diagnosis not present

## 2019-03-26 DIAGNOSIS — I509 Heart failure, unspecified: Secondary | ICD-10-CM | POA: Diagnosis not present

## 2019-03-26 DIAGNOSIS — K5909 Other constipation: Secondary | ICD-10-CM | POA: Diagnosis not present

## 2019-03-26 DIAGNOSIS — I771 Stricture of artery: Secondary | ICD-10-CM | POA: Diagnosis not present

## 2019-03-26 DIAGNOSIS — E11621 Type 2 diabetes mellitus with foot ulcer: Secondary | ICD-10-CM | POA: Diagnosis present

## 2019-03-26 DIAGNOSIS — F17210 Nicotine dependence, cigarettes, uncomplicated: Secondary | ICD-10-CM | POA: Diagnosis not present

## 2019-03-26 DIAGNOSIS — E1165 Type 2 diabetes mellitus with hyperglycemia: Secondary | ICD-10-CM | POA: Diagnosis not present

## 2019-03-26 DIAGNOSIS — Z452 Encounter for adjustment and management of vascular access device: Secondary | ICD-10-CM | POA: Diagnosis not present

## 2019-03-26 DIAGNOSIS — I10 Essential (primary) hypertension: Secondary | ICD-10-CM | POA: Diagnosis not present

## 2019-03-26 DIAGNOSIS — I5020 Unspecified systolic (congestive) heart failure: Secondary | ICD-10-CM | POA: Diagnosis not present

## 2019-03-26 DIAGNOSIS — J9692 Respiratory failure, unspecified with hypercapnia: Secondary | ICD-10-CM | POA: Diagnosis not present

## 2019-03-26 DIAGNOSIS — E1142 Type 2 diabetes mellitus with diabetic polyneuropathy: Secondary | ICD-10-CM | POA: Diagnosis present

## 2019-03-26 DIAGNOSIS — S91301A Unspecified open wound, right foot, initial encounter: Secondary | ICD-10-CM | POA: Diagnosis not present

## 2019-03-26 DIAGNOSIS — G9341 Metabolic encephalopathy: Secondary | ICD-10-CM | POA: Diagnosis not present

## 2019-03-26 DIAGNOSIS — Z89422 Acquired absence of other left toe(s): Secondary | ICD-10-CM | POA: Diagnosis not present

## 2019-03-26 DIAGNOSIS — J9601 Acute respiratory failure with hypoxia: Secondary | ICD-10-CM | POA: Diagnosis not present

## 2019-03-26 DIAGNOSIS — I130 Hypertensive heart and chronic kidney disease with heart failure and stage 1 through stage 4 chronic kidney disease, or unspecified chronic kidney disease: Secondary | ICD-10-CM | POA: Diagnosis present

## 2019-03-26 DIAGNOSIS — J9602 Acute respiratory failure with hypercapnia: Secondary | ICD-10-CM | POA: Diagnosis not present

## 2019-03-26 DIAGNOSIS — I313 Pericardial effusion (noninflammatory): Secondary | ICD-10-CM | POA: Diagnosis not present

## 2019-03-26 DIAGNOSIS — I96 Gangrene, not elsewhere classified: Secondary | ICD-10-CM | POA: Diagnosis not present

## 2019-03-26 DIAGNOSIS — L97419 Non-pressure chronic ulcer of right heel and midfoot with unspecified severity: Secondary | ICD-10-CM | POA: Diagnosis present

## 2019-03-26 DIAGNOSIS — Z95820 Peripheral vascular angioplasty status with implants and grafts: Secondary | ICD-10-CM | POA: Diagnosis not present

## 2019-03-26 DIAGNOSIS — Z09 Encounter for follow-up examination after completed treatment for conditions other than malignant neoplasm: Secondary | ICD-10-CM | POA: Diagnosis not present

## 2019-03-26 DIAGNOSIS — E1122 Type 2 diabetes mellitus with diabetic chronic kidney disease: Secondary | ICD-10-CM | POA: Diagnosis present

## 2019-03-26 DIAGNOSIS — R21 Rash and other nonspecific skin eruption: Secondary | ICD-10-CM | POA: Diagnosis not present

## 2019-03-26 DIAGNOSIS — J811 Chronic pulmonary edema: Secondary | ICD-10-CM | POA: Diagnosis not present

## 2019-03-26 DIAGNOSIS — Z79891 Long term (current) use of opiate analgesic: Secondary | ICD-10-CM | POA: Diagnosis not present

## 2019-03-26 DIAGNOSIS — Z720 Tobacco use: Secondary | ICD-10-CM | POA: Diagnosis not present

## 2019-03-26 DIAGNOSIS — N179 Acute kidney failure, unspecified: Secondary | ICD-10-CM | POA: Diagnosis not present

## 2019-03-26 DIAGNOSIS — Z8679 Personal history of other diseases of the circulatory system: Secondary | ICD-10-CM | POA: Diagnosis not present

## 2019-03-26 DIAGNOSIS — Z79899 Other long term (current) drug therapy: Secondary | ICD-10-CM | POA: Diagnosis not present

## 2019-03-26 DIAGNOSIS — I517 Cardiomegaly: Secondary | ICD-10-CM | POA: Diagnosis not present

## 2019-03-26 DIAGNOSIS — J9600 Acute respiratory failure, unspecified whether with hypoxia or hypercapnia: Secondary | ICD-10-CM | POA: Diagnosis not present

## 2019-03-26 DIAGNOSIS — M86172 Other acute osteomyelitis, left ankle and foot: Secondary | ICD-10-CM | POA: Diagnosis not present

## 2019-03-26 DIAGNOSIS — L97519 Non-pressure chronic ulcer of other part of right foot with unspecified severity: Secondary | ICD-10-CM | POA: Diagnosis not present

## 2019-03-26 DIAGNOSIS — I739 Peripheral vascular disease, unspecified: Secondary | ICD-10-CM | POA: Diagnosis not present

## 2019-03-26 DIAGNOSIS — I4949 Other premature depolarization: Secondary | ICD-10-CM | POA: Diagnosis not present

## 2019-03-26 DIAGNOSIS — Z01810 Encounter for preprocedural cardiovascular examination: Secondary | ICD-10-CM | POA: Diagnosis not present

## 2019-03-26 DIAGNOSIS — G253 Myoclonus: Secondary | ICD-10-CM | POA: Diagnosis not present

## 2019-03-26 DIAGNOSIS — I2510 Atherosclerotic heart disease of native coronary artery without angina pectoris: Secondary | ICD-10-CM | POA: Diagnosis not present

## 2019-03-26 DIAGNOSIS — I701 Atherosclerosis of renal artery: Secondary | ICD-10-CM | POA: Diagnosis not present

## 2019-03-26 DIAGNOSIS — B965 Pseudomonas (aeruginosa) (mallei) (pseudomallei) as the cause of diseases classified elsewhere: Secondary | ICD-10-CM | POA: Diagnosis present

## 2019-03-26 DIAGNOSIS — E8770 Fluid overload, unspecified: Secondary | ICD-10-CM | POA: Diagnosis not present

## 2019-03-26 DIAGNOSIS — R600 Localized edema: Secondary | ICD-10-CM | POA: Diagnosis not present

## 2019-03-26 DIAGNOSIS — G8918 Other acute postprocedural pain: Secondary | ICD-10-CM | POA: Diagnosis not present

## 2019-03-26 DIAGNOSIS — Z95828 Presence of other vascular implants and grafts: Secondary | ICD-10-CM | POA: Diagnosis not present

## 2019-03-26 DIAGNOSIS — Z794 Long term (current) use of insulin: Secondary | ICD-10-CM | POA: Diagnosis not present

## 2019-03-26 DIAGNOSIS — D62 Acute posthemorrhagic anemia: Secondary | ICD-10-CM | POA: Diagnosis not present

## 2019-03-26 DIAGNOSIS — E278 Other specified disorders of adrenal gland: Secondary | ICD-10-CM | POA: Diagnosis not present

## 2019-03-26 DIAGNOSIS — I724 Aneurysm of artery of lower extremity: Secondary | ICD-10-CM | POA: Diagnosis not present

## 2019-03-26 DIAGNOSIS — E11319 Type 2 diabetes mellitus with unspecified diabetic retinopathy without macular edema: Secondary | ICD-10-CM | POA: Diagnosis present

## 2019-03-26 DIAGNOSIS — M86171 Other acute osteomyelitis, right ankle and foot: Secondary | ICD-10-CM | POA: Diagnosis not present

## 2019-03-26 DIAGNOSIS — Z01818 Encounter for other preprocedural examination: Secondary | ICD-10-CM | POA: Diagnosis not present

## 2019-03-26 DIAGNOSIS — R339 Retention of urine, unspecified: Secondary | ICD-10-CM | POA: Diagnosis not present

## 2019-03-26 DIAGNOSIS — I70269 Atherosclerosis of native arteries of extremities with gangrene, unspecified extremity: Secondary | ICD-10-CM | POA: Diagnosis not present

## 2019-03-26 DIAGNOSIS — E1152 Type 2 diabetes mellitus with diabetic peripheral angiopathy with gangrene: Secondary | ICD-10-CM | POA: Diagnosis not present

## 2019-03-26 DIAGNOSIS — E1169 Type 2 diabetes mellitus with other specified complication: Secondary | ICD-10-CM | POA: Diagnosis present

## 2019-03-26 DIAGNOSIS — E119 Type 2 diabetes mellitus without complications: Secondary | ICD-10-CM | POA: Diagnosis not present

## 2019-03-26 DIAGNOSIS — K5900 Constipation, unspecified: Secondary | ICD-10-CM | POA: Diagnosis not present

## 2019-03-26 DIAGNOSIS — R0902 Hypoxemia: Secondary | ICD-10-CM | POA: Diagnosis not present

## 2019-03-26 DIAGNOSIS — N183 Chronic kidney disease, stage 3 unspecified: Secondary | ICD-10-CM | POA: Diagnosis present

## 2019-03-26 DIAGNOSIS — Z20822 Contact with and (suspected) exposure to covid-19: Secondary | ICD-10-CM | POA: Diagnosis present

## 2019-03-26 DIAGNOSIS — E1151 Type 2 diabetes mellitus with diabetic peripheral angiopathy without gangrene: Secondary | ICD-10-CM | POA: Diagnosis not present

## 2019-03-27 DIAGNOSIS — G8918 Other acute postprocedural pain: Secondary | ICD-10-CM | POA: Diagnosis not present

## 2019-03-27 DIAGNOSIS — E278 Other specified disorders of adrenal gland: Secondary | ICD-10-CM | POA: Diagnosis not present

## 2019-03-27 DIAGNOSIS — L97429 Non-pressure chronic ulcer of left heel and midfoot with unspecified severity: Secondary | ICD-10-CM | POA: Diagnosis not present

## 2019-03-27 DIAGNOSIS — I739 Peripheral vascular disease, unspecified: Secondary | ICD-10-CM | POA: Diagnosis not present

## 2019-03-27 DIAGNOSIS — E8770 Fluid overload, unspecified: Secondary | ICD-10-CM | POA: Diagnosis not present

## 2019-03-27 DIAGNOSIS — R0902 Hypoxemia: Secondary | ICD-10-CM | POA: Diagnosis not present

## 2019-03-27 DIAGNOSIS — I313 Pericardial effusion (noninflammatory): Secondary | ICD-10-CM | POA: Diagnosis not present

## 2019-03-27 DIAGNOSIS — L97519 Non-pressure chronic ulcer of other part of right foot with unspecified severity: Secondary | ICD-10-CM | POA: Diagnosis not present

## 2019-03-27 DIAGNOSIS — J9602 Acute respiratory failure with hypercapnia: Secondary | ICD-10-CM | POA: Diagnosis not present

## 2019-03-27 DIAGNOSIS — I701 Atherosclerosis of renal artery: Secondary | ICD-10-CM | POA: Diagnosis not present

## 2019-03-27 DIAGNOSIS — J9692 Respiratory failure, unspecified with hypercapnia: Secondary | ICD-10-CM | POA: Diagnosis not present

## 2019-03-27 DIAGNOSIS — Z720 Tobacco use: Secondary | ICD-10-CM | POA: Diagnosis not present

## 2019-03-27 DIAGNOSIS — I10 Essential (primary) hypertension: Secondary | ICD-10-CM | POA: Diagnosis not present

## 2019-03-27 DIAGNOSIS — I509 Heart failure, unspecified: Secondary | ICD-10-CM | POA: Diagnosis not present

## 2019-03-28 DIAGNOSIS — J9692 Respiratory failure, unspecified with hypercapnia: Secondary | ICD-10-CM | POA: Diagnosis not present

## 2019-03-28 DIAGNOSIS — F17210 Nicotine dependence, cigarettes, uncomplicated: Secondary | ICD-10-CM | POA: Diagnosis not present

## 2019-03-28 DIAGNOSIS — E119 Type 2 diabetes mellitus without complications: Secondary | ICD-10-CM | POA: Diagnosis not present

## 2019-03-28 DIAGNOSIS — I10 Essential (primary) hypertension: Secondary | ICD-10-CM | POA: Diagnosis not present

## 2019-03-28 DIAGNOSIS — J811 Chronic pulmonary edema: Secondary | ICD-10-CM | POA: Diagnosis not present

## 2019-03-28 DIAGNOSIS — E8770 Fluid overload, unspecified: Secondary | ICD-10-CM | POA: Diagnosis not present

## 2019-03-28 DIAGNOSIS — I96 Gangrene, not elsewhere classified: Secondary | ICD-10-CM | POA: Diagnosis not present

## 2019-03-28 DIAGNOSIS — I5020 Unspecified systolic (congestive) heart failure: Secondary | ICD-10-CM | POA: Diagnosis not present

## 2019-03-28 DIAGNOSIS — G8918 Other acute postprocedural pain: Secondary | ICD-10-CM | POA: Diagnosis not present

## 2019-03-29 DIAGNOSIS — D62 Acute posthemorrhagic anemia: Secondary | ICD-10-CM | POA: Diagnosis not present

## 2019-03-29 DIAGNOSIS — G8918 Other acute postprocedural pain: Secondary | ICD-10-CM | POA: Diagnosis not present

## 2019-03-29 DIAGNOSIS — I724 Aneurysm of artery of lower extremity: Secondary | ICD-10-CM | POA: Diagnosis not present

## 2019-03-29 DIAGNOSIS — I5020 Unspecified systolic (congestive) heart failure: Secondary | ICD-10-CM | POA: Diagnosis not present

## 2019-03-29 DIAGNOSIS — E8770 Fluid overload, unspecified: Secondary | ICD-10-CM | POA: Diagnosis not present

## 2019-03-29 DIAGNOSIS — Z01810 Encounter for preprocedural cardiovascular examination: Secondary | ICD-10-CM | POA: Diagnosis not present

## 2019-03-29 DIAGNOSIS — J811 Chronic pulmonary edema: Secondary | ICD-10-CM | POA: Diagnosis not present

## 2019-03-29 DIAGNOSIS — I739 Peripheral vascular disease, unspecified: Secondary | ICD-10-CM | POA: Diagnosis not present

## 2019-03-29 DIAGNOSIS — I10 Essential (primary) hypertension: Secondary | ICD-10-CM | POA: Diagnosis not present

## 2019-03-29 DIAGNOSIS — E1165 Type 2 diabetes mellitus with hyperglycemia: Secondary | ICD-10-CM | POA: Diagnosis not present

## 2019-03-29 DIAGNOSIS — J9601 Acute respiratory failure with hypoxia: Secondary | ICD-10-CM | POA: Diagnosis not present

## 2019-03-29 DIAGNOSIS — J9692 Respiratory failure, unspecified with hypercapnia: Secondary | ICD-10-CM | POA: Diagnosis not present

## 2019-03-29 DIAGNOSIS — Z794 Long term (current) use of insulin: Secondary | ICD-10-CM | POA: Diagnosis not present

## 2019-03-30 DIAGNOSIS — R600 Localized edema: Secondary | ICD-10-CM | POA: Diagnosis not present

## 2019-03-30 DIAGNOSIS — I517 Cardiomegaly: Secondary | ICD-10-CM | POA: Diagnosis not present

## 2019-03-30 DIAGNOSIS — Z794 Long term (current) use of insulin: Secondary | ICD-10-CM | POA: Diagnosis not present

## 2019-03-30 DIAGNOSIS — I10 Essential (primary) hypertension: Secondary | ICD-10-CM | POA: Diagnosis not present

## 2019-03-30 DIAGNOSIS — N179 Acute kidney failure, unspecified: Secondary | ICD-10-CM | POA: Diagnosis not present

## 2019-03-30 DIAGNOSIS — R0902 Hypoxemia: Secondary | ICD-10-CM | POA: Diagnosis not present

## 2019-03-30 DIAGNOSIS — I2510 Atherosclerotic heart disease of native coronary artery without angina pectoris: Secondary | ICD-10-CM | POA: Diagnosis not present

## 2019-03-30 DIAGNOSIS — G253 Myoclonus: Secondary | ICD-10-CM | POA: Diagnosis not present

## 2019-03-30 DIAGNOSIS — Z01818 Encounter for other preprocedural examination: Secondary | ICD-10-CM | POA: Diagnosis not present

## 2019-03-30 DIAGNOSIS — I739 Peripheral vascular disease, unspecified: Secondary | ICD-10-CM | POA: Diagnosis not present

## 2019-03-30 DIAGNOSIS — I724 Aneurysm of artery of lower extremity: Secondary | ICD-10-CM | POA: Diagnosis not present

## 2019-03-30 DIAGNOSIS — I5020 Unspecified systolic (congestive) heart failure: Secondary | ICD-10-CM | POA: Diagnosis not present

## 2019-03-30 DIAGNOSIS — E119 Type 2 diabetes mellitus without complications: Secondary | ICD-10-CM | POA: Diagnosis not present

## 2019-03-31 DIAGNOSIS — I96 Gangrene, not elsewhere classified: Secondary | ICD-10-CM | POA: Diagnosis not present

## 2019-03-31 DIAGNOSIS — S91301A Unspecified open wound, right foot, initial encounter: Secondary | ICD-10-CM | POA: Diagnosis not present

## 2019-03-31 DIAGNOSIS — S91302A Unspecified open wound, left foot, initial encounter: Secondary | ICD-10-CM | POA: Diagnosis not present

## 2019-03-31 DIAGNOSIS — I70269 Atherosclerosis of native arteries of extremities with gangrene, unspecified extremity: Secondary | ICD-10-CM | POA: Diagnosis not present

## 2019-03-31 DIAGNOSIS — Z720 Tobacco use: Secondary | ICD-10-CM | POA: Diagnosis not present

## 2019-03-31 DIAGNOSIS — Z89422 Acquired absence of other left toe(s): Secondary | ICD-10-CM | POA: Diagnosis not present

## 2019-03-31 DIAGNOSIS — E1151 Type 2 diabetes mellitus with diabetic peripheral angiopathy without gangrene: Secondary | ICD-10-CM | POA: Diagnosis not present

## 2019-03-31 DIAGNOSIS — E119 Type 2 diabetes mellitus without complications: Secondary | ICD-10-CM | POA: Diagnosis not present

## 2019-03-31 DIAGNOSIS — Z794 Long term (current) use of insulin: Secondary | ICD-10-CM | POA: Diagnosis not present

## 2019-03-31 DIAGNOSIS — I4949 Other premature depolarization: Secondary | ICD-10-CM | POA: Diagnosis not present

## 2019-03-31 DIAGNOSIS — E1152 Type 2 diabetes mellitus with diabetic peripheral angiopathy with gangrene: Secondary | ICD-10-CM | POA: Diagnosis not present

## 2019-04-01 DIAGNOSIS — M86171 Other acute osteomyelitis, right ankle and foot: Secondary | ICD-10-CM | POA: Diagnosis not present

## 2019-04-01 DIAGNOSIS — R600 Localized edema: Secondary | ICD-10-CM | POA: Diagnosis not present

## 2019-04-01 DIAGNOSIS — Z794 Long term (current) use of insulin: Secondary | ICD-10-CM | POA: Diagnosis not present

## 2019-04-01 DIAGNOSIS — I517 Cardiomegaly: Secondary | ICD-10-CM | POA: Diagnosis not present

## 2019-04-01 DIAGNOSIS — N179 Acute kidney failure, unspecified: Secondary | ICD-10-CM | POA: Diagnosis not present

## 2019-04-01 DIAGNOSIS — M86172 Other acute osteomyelitis, left ankle and foot: Secondary | ICD-10-CM | POA: Diagnosis not present

## 2019-04-01 DIAGNOSIS — E119 Type 2 diabetes mellitus without complications: Secondary | ICD-10-CM | POA: Diagnosis not present

## 2019-04-01 DIAGNOSIS — I739 Peripheral vascular disease, unspecified: Secondary | ICD-10-CM | POA: Diagnosis not present

## 2019-04-02 DIAGNOSIS — E119 Type 2 diabetes mellitus without complications: Secondary | ICD-10-CM | POA: Diagnosis not present

## 2019-04-02 DIAGNOSIS — Z794 Long term (current) use of insulin: Secondary | ICD-10-CM | POA: Diagnosis not present

## 2019-04-05 DIAGNOSIS — I5020 Unspecified systolic (congestive) heart failure: Secondary | ICD-10-CM | POA: Diagnosis not present

## 2019-04-05 DIAGNOSIS — D649 Anemia, unspecified: Secondary | ICD-10-CM | POA: Diagnosis not present

## 2019-04-05 DIAGNOSIS — G253 Myoclonus: Secondary | ICD-10-CM | POA: Diagnosis not present

## 2019-04-05 DIAGNOSIS — I739 Peripheral vascular disease, unspecified: Secondary | ICD-10-CM | POA: Diagnosis not present

## 2019-04-05 DIAGNOSIS — K5900 Constipation, unspecified: Secondary | ICD-10-CM | POA: Diagnosis not present

## 2019-04-05 DIAGNOSIS — R21 Rash and other nonspecific skin eruption: Secondary | ICD-10-CM | POA: Diagnosis not present

## 2019-04-05 DIAGNOSIS — N179 Acute kidney failure, unspecified: Secondary | ICD-10-CM | POA: Diagnosis not present

## 2019-04-05 DIAGNOSIS — I2510 Atherosclerotic heart disease of native coronary artery without angina pectoris: Secondary | ICD-10-CM | POA: Diagnosis not present

## 2019-04-06 DIAGNOSIS — Z452 Encounter for adjustment and management of vascular access device: Secondary | ICD-10-CM | POA: Diagnosis not present

## 2019-04-06 DIAGNOSIS — I739 Peripheral vascular disease, unspecified: Secondary | ICD-10-CM | POA: Diagnosis not present

## 2019-04-06 DIAGNOSIS — I5020 Unspecified systolic (congestive) heart failure: Secondary | ICD-10-CM | POA: Diagnosis not present

## 2019-04-06 DIAGNOSIS — D649 Anemia, unspecified: Secondary | ICD-10-CM | POA: Diagnosis not present

## 2019-04-06 DIAGNOSIS — K5900 Constipation, unspecified: Secondary | ICD-10-CM | POA: Diagnosis not present

## 2019-04-06 DIAGNOSIS — I2510 Atherosclerotic heart disease of native coronary artery without angina pectoris: Secondary | ICD-10-CM | POA: Diagnosis not present

## 2019-04-06 DIAGNOSIS — N179 Acute kidney failure, unspecified: Secondary | ICD-10-CM | POA: Diagnosis not present

## 2019-04-09 DIAGNOSIS — Z5181 Encounter for therapeutic drug level monitoring: Secondary | ICD-10-CM | POA: Diagnosis not present

## 2019-04-09 DIAGNOSIS — I5020 Unspecified systolic (congestive) heart failure: Secondary | ICD-10-CM | POA: Diagnosis not present

## 2019-04-09 DIAGNOSIS — Z89432 Acquired absence of left foot: Secondary | ICD-10-CM | POA: Diagnosis not present

## 2019-04-09 DIAGNOSIS — E1169 Type 2 diabetes mellitus with other specified complication: Secondary | ICD-10-CM | POA: Diagnosis not present

## 2019-04-09 DIAGNOSIS — K5900 Constipation, unspecified: Secondary | ICD-10-CM | POA: Diagnosis not present

## 2019-04-09 DIAGNOSIS — Z89422 Acquired absence of other left toe(s): Secondary | ICD-10-CM | POA: Diagnosis not present

## 2019-04-09 DIAGNOSIS — D649 Anemia, unspecified: Secondary | ICD-10-CM | POA: Diagnosis not present

## 2019-04-09 DIAGNOSIS — N179 Acute kidney failure, unspecified: Secondary | ICD-10-CM | POA: Diagnosis not present

## 2019-04-09 DIAGNOSIS — M86172 Other acute osteomyelitis, left ankle and foot: Secondary | ICD-10-CM | POA: Diagnosis not present

## 2019-04-09 DIAGNOSIS — I110 Hypertensive heart disease with heart failure: Secondary | ICD-10-CM | POA: Diagnosis not present

## 2019-04-09 DIAGNOSIS — B965 Pseudomonas (aeruginosa) (mallei) (pseudomallei) as the cause of diseases classified elsewhere: Secondary | ICD-10-CM | POA: Diagnosis not present

## 2019-04-09 DIAGNOSIS — E1152 Type 2 diabetes mellitus with diabetic peripheral angiopathy with gangrene: Secondary | ICD-10-CM | POA: Diagnosis not present

## 2019-04-09 DIAGNOSIS — L97416 Non-pressure chronic ulcer of right heel and midfoot with bone involvement without evidence of necrosis: Secondary | ICD-10-CM | POA: Diagnosis not present

## 2019-04-09 DIAGNOSIS — M797 Fibromyalgia: Secondary | ICD-10-CM | POA: Diagnosis not present

## 2019-04-09 DIAGNOSIS — M86171 Other acute osteomyelitis, right ankle and foot: Secondary | ICD-10-CM | POA: Diagnosis not present

## 2019-04-09 DIAGNOSIS — B9562 Methicillin resistant Staphylococcus aureus infection as the cause of diseases classified elsewhere: Secondary | ICD-10-CM | POA: Diagnosis not present

## 2019-04-09 DIAGNOSIS — R3914 Feeling of incomplete bladder emptying: Secondary | ICD-10-CM | POA: Diagnosis not present

## 2019-04-09 DIAGNOSIS — Z452 Encounter for adjustment and management of vascular access device: Secondary | ICD-10-CM | POA: Diagnosis not present

## 2019-04-09 DIAGNOSIS — G4700 Insomnia, unspecified: Secondary | ICD-10-CM | POA: Diagnosis not present

## 2019-04-09 DIAGNOSIS — R32 Unspecified urinary incontinence: Secondary | ICD-10-CM | POA: Diagnosis not present

## 2019-04-09 DIAGNOSIS — I252 Old myocardial infarction: Secondary | ICD-10-CM | POA: Diagnosis not present

## 2019-04-09 DIAGNOSIS — F17210 Nicotine dependence, cigarettes, uncomplicated: Secondary | ICD-10-CM | POA: Diagnosis not present

## 2019-04-09 DIAGNOSIS — N319 Neuromuscular dysfunction of bladder, unspecified: Secondary | ICD-10-CM | POA: Diagnosis not present

## 2019-04-09 DIAGNOSIS — E11621 Type 2 diabetes mellitus with foot ulcer: Secondary | ICD-10-CM | POA: Diagnosis not present

## 2019-04-09 DIAGNOSIS — I2510 Atherosclerotic heart disease of native coronary artery without angina pectoris: Secondary | ICD-10-CM | POA: Diagnosis not present

## 2019-04-10 DIAGNOSIS — B9562 Methicillin resistant Staphylococcus aureus infection as the cause of diseases classified elsewhere: Secondary | ICD-10-CM | POA: Diagnosis not present

## 2019-04-10 DIAGNOSIS — M86171 Other acute osteomyelitis, right ankle and foot: Secondary | ICD-10-CM | POA: Diagnosis not present

## 2019-04-10 DIAGNOSIS — M86172 Other acute osteomyelitis, left ankle and foot: Secondary | ICD-10-CM | POA: Diagnosis not present

## 2019-04-10 DIAGNOSIS — E1169 Type 2 diabetes mellitus with other specified complication: Secondary | ICD-10-CM | POA: Diagnosis not present

## 2019-04-10 DIAGNOSIS — M869 Osteomyelitis, unspecified: Secondary | ICD-10-CM | POA: Diagnosis not present

## 2019-04-10 DIAGNOSIS — B965 Pseudomonas (aeruginosa) (mallei) (pseudomallei) as the cause of diseases classified elsewhere: Secondary | ICD-10-CM | POA: Diagnosis not present

## 2019-04-10 DIAGNOSIS — E11621 Type 2 diabetes mellitus with foot ulcer: Secondary | ICD-10-CM | POA: Diagnosis not present

## 2019-04-11 DIAGNOSIS — M86171 Other acute osteomyelitis, right ankle and foot: Secondary | ICD-10-CM | POA: Diagnosis not present

## 2019-04-11 DIAGNOSIS — M86172 Other acute osteomyelitis, left ankle and foot: Secondary | ICD-10-CM | POA: Diagnosis not present

## 2019-04-11 DIAGNOSIS — E11621 Type 2 diabetes mellitus with foot ulcer: Secondary | ICD-10-CM | POA: Diagnosis not present

## 2019-04-11 DIAGNOSIS — B9562 Methicillin resistant Staphylococcus aureus infection as the cause of diseases classified elsewhere: Secondary | ICD-10-CM | POA: Diagnosis not present

## 2019-04-11 DIAGNOSIS — B965 Pseudomonas (aeruginosa) (mallei) (pseudomallei) as the cause of diseases classified elsewhere: Secondary | ICD-10-CM | POA: Diagnosis not present

## 2019-04-11 DIAGNOSIS — E1169 Type 2 diabetes mellitus with other specified complication: Secondary | ICD-10-CM | POA: Diagnosis not present

## 2019-04-14 DIAGNOSIS — B9562 Methicillin resistant Staphylococcus aureus infection as the cause of diseases classified elsewhere: Secondary | ICD-10-CM | POA: Diagnosis not present

## 2019-04-14 DIAGNOSIS — E11621 Type 2 diabetes mellitus with foot ulcer: Secondary | ICD-10-CM | POA: Diagnosis not present

## 2019-04-14 DIAGNOSIS — M86171 Other acute osteomyelitis, right ankle and foot: Secondary | ICD-10-CM | POA: Diagnosis not present

## 2019-04-14 DIAGNOSIS — E1169 Type 2 diabetes mellitus with other specified complication: Secondary | ICD-10-CM | POA: Diagnosis not present

## 2019-04-14 DIAGNOSIS — B965 Pseudomonas (aeruginosa) (mallei) (pseudomallei) as the cause of diseases classified elsewhere: Secondary | ICD-10-CM | POA: Diagnosis not present

## 2019-04-14 DIAGNOSIS — I96 Gangrene, not elsewhere classified: Secondary | ICD-10-CM | POA: Diagnosis not present

## 2019-04-14 DIAGNOSIS — M86172 Other acute osteomyelitis, left ankle and foot: Secondary | ICD-10-CM | POA: Diagnosis not present

## 2019-04-15 DIAGNOSIS — B9562 Methicillin resistant Staphylococcus aureus infection as the cause of diseases classified elsewhere: Secondary | ICD-10-CM | POA: Diagnosis not present

## 2019-04-15 DIAGNOSIS — M86171 Other acute osteomyelitis, right ankle and foot: Secondary | ICD-10-CM | POA: Diagnosis not present

## 2019-04-15 DIAGNOSIS — M86172 Other acute osteomyelitis, left ankle and foot: Secondary | ICD-10-CM | POA: Diagnosis not present

## 2019-04-15 DIAGNOSIS — I739 Peripheral vascular disease, unspecified: Secondary | ICD-10-CM | POA: Diagnosis not present

## 2019-04-15 DIAGNOSIS — E119 Type 2 diabetes mellitus without complications: Secondary | ICD-10-CM | POA: Diagnosis not present

## 2019-04-16 DIAGNOSIS — E11621 Type 2 diabetes mellitus with foot ulcer: Secondary | ICD-10-CM | POA: Diagnosis not present

## 2019-04-16 DIAGNOSIS — I6522 Occlusion and stenosis of left carotid artery: Secondary | ICD-10-CM | POA: Diagnosis not present

## 2019-04-16 DIAGNOSIS — B965 Pseudomonas (aeruginosa) (mallei) (pseudomallei) as the cause of diseases classified elsewhere: Secondary | ICD-10-CM | POA: Diagnosis not present

## 2019-04-16 DIAGNOSIS — E1169 Type 2 diabetes mellitus with other specified complication: Secondary | ICD-10-CM | POA: Diagnosis not present

## 2019-04-16 DIAGNOSIS — M86171 Other acute osteomyelitis, right ankle and foot: Secondary | ICD-10-CM | POA: Diagnosis not present

## 2019-04-16 DIAGNOSIS — M961 Postlaminectomy syndrome, not elsewhere classified: Secondary | ICD-10-CM | POA: Diagnosis not present

## 2019-04-16 DIAGNOSIS — I739 Peripheral vascular disease, unspecified: Secondary | ICD-10-CM | POA: Diagnosis not present

## 2019-04-16 DIAGNOSIS — B9562 Methicillin resistant Staphylococcus aureus infection as the cause of diseases classified elsewhere: Secondary | ICD-10-CM | POA: Diagnosis not present

## 2019-04-16 DIAGNOSIS — M86172 Other acute osteomyelitis, left ankle and foot: Secondary | ICD-10-CM | POA: Diagnosis not present

## 2019-04-16 DIAGNOSIS — G894 Chronic pain syndrome: Secondary | ICD-10-CM | POA: Diagnosis not present

## 2019-04-18 DIAGNOSIS — M86112 Other acute osteomyelitis, left shoulder: Secondary | ICD-10-CM | POA: Diagnosis not present

## 2019-04-18 DIAGNOSIS — I96 Gangrene, not elsewhere classified: Secondary | ICD-10-CM | POA: Diagnosis not present

## 2019-04-18 DIAGNOSIS — M86171 Other acute osteomyelitis, right ankle and foot: Secondary | ICD-10-CM | POA: Diagnosis not present

## 2019-04-18 DIAGNOSIS — E1169 Type 2 diabetes mellitus with other specified complication: Secondary | ICD-10-CM | POA: Diagnosis not present

## 2019-04-18 DIAGNOSIS — B9562 Methicillin resistant Staphylococcus aureus infection as the cause of diseases classified elsewhere: Secondary | ICD-10-CM | POA: Diagnosis not present

## 2019-04-18 DIAGNOSIS — B965 Pseudomonas (aeruginosa) (mallei) (pseudomallei) as the cause of diseases classified elsewhere: Secondary | ICD-10-CM | POA: Diagnosis not present

## 2019-04-18 DIAGNOSIS — E11621 Type 2 diabetes mellitus with foot ulcer: Secondary | ICD-10-CM | POA: Diagnosis not present

## 2019-04-18 DIAGNOSIS — M86172 Other acute osteomyelitis, left ankle and foot: Secondary | ICD-10-CM | POA: Diagnosis not present

## 2019-04-21 DIAGNOSIS — I96 Gangrene, not elsewhere classified: Secondary | ICD-10-CM | POA: Diagnosis not present

## 2019-04-21 DIAGNOSIS — E11621 Type 2 diabetes mellitus with foot ulcer: Secondary | ICD-10-CM | POA: Diagnosis not present

## 2019-04-21 DIAGNOSIS — M86171 Other acute osteomyelitis, right ankle and foot: Secondary | ICD-10-CM | POA: Diagnosis not present

## 2019-04-21 DIAGNOSIS — B9562 Methicillin resistant Staphylococcus aureus infection as the cause of diseases classified elsewhere: Secondary | ICD-10-CM | POA: Diagnosis not present

## 2019-04-21 DIAGNOSIS — B965 Pseudomonas (aeruginosa) (mallei) (pseudomallei) as the cause of diseases classified elsewhere: Secondary | ICD-10-CM | POA: Diagnosis not present

## 2019-04-21 DIAGNOSIS — M86172 Other acute osteomyelitis, left ankle and foot: Secondary | ICD-10-CM | POA: Diagnosis not present

## 2019-04-21 DIAGNOSIS — E1169 Type 2 diabetes mellitus with other specified complication: Secondary | ICD-10-CM | POA: Diagnosis not present

## 2019-04-22 DIAGNOSIS — E11621 Type 2 diabetes mellitus with foot ulcer: Secondary | ICD-10-CM | POA: Diagnosis not present

## 2019-04-22 DIAGNOSIS — M797 Fibromyalgia: Secondary | ICD-10-CM | POA: Diagnosis not present

## 2019-04-22 DIAGNOSIS — I2510 Atherosclerotic heart disease of native coronary artery without angina pectoris: Secondary | ICD-10-CM | POA: Diagnosis not present

## 2019-04-22 DIAGNOSIS — K521 Toxic gastroenteritis and colitis: Secondary | ICD-10-CM | POA: Diagnosis not present

## 2019-04-22 DIAGNOSIS — Z8619 Personal history of other infectious and parasitic diseases: Secondary | ICD-10-CM | POA: Diagnosis not present

## 2019-04-22 DIAGNOSIS — E119 Type 2 diabetes mellitus without complications: Secondary | ICD-10-CM | POA: Diagnosis not present

## 2019-04-22 DIAGNOSIS — I509 Heart failure, unspecified: Secondary | ICD-10-CM | POA: Diagnosis not present

## 2019-04-22 DIAGNOSIS — Z955 Presence of coronary angioplasty implant and graft: Secondary | ICD-10-CM | POA: Diagnosis not present

## 2019-04-22 DIAGNOSIS — Z8673 Personal history of transient ischemic attack (TIA), and cerebral infarction without residual deficits: Secondary | ICD-10-CM | POA: Diagnosis not present

## 2019-04-22 DIAGNOSIS — Z720 Tobacco use: Secondary | ICD-10-CM | POA: Diagnosis not present

## 2019-04-22 DIAGNOSIS — L8990 Pressure ulcer of unspecified site, unspecified stage: Secondary | ICD-10-CM | POA: Diagnosis not present

## 2019-04-22 DIAGNOSIS — Z794 Long term (current) use of insulin: Secondary | ICD-10-CM | POA: Diagnosis not present

## 2019-04-22 DIAGNOSIS — I110 Hypertensive heart disease with heart failure: Secondary | ICD-10-CM | POA: Diagnosis not present

## 2019-04-22 DIAGNOSIS — E1151 Type 2 diabetes mellitus with diabetic peripheral angiopathy without gangrene: Secondary | ICD-10-CM | POA: Diagnosis not present

## 2019-04-22 DIAGNOSIS — L89610 Pressure ulcer of right heel, unstageable: Secondary | ICD-10-CM | POA: Diagnosis not present

## 2019-04-22 DIAGNOSIS — B351 Tinea unguium: Secondary | ICD-10-CM | POA: Diagnosis not present

## 2019-04-22 DIAGNOSIS — Z89422 Acquired absence of other left toe(s): Secondary | ICD-10-CM | POA: Diagnosis not present

## 2019-04-22 DIAGNOSIS — Z9049 Acquired absence of other specified parts of digestive tract: Secondary | ICD-10-CM | POA: Diagnosis not present

## 2019-04-22 DIAGNOSIS — T3695XA Adverse effect of unspecified systemic antibiotic, initial encounter: Secondary | ICD-10-CM | POA: Diagnosis not present

## 2019-04-23 DIAGNOSIS — E1169 Type 2 diabetes mellitus with other specified complication: Secondary | ICD-10-CM | POA: Diagnosis not present

## 2019-04-23 DIAGNOSIS — M86171 Other acute osteomyelitis, right ankle and foot: Secondary | ICD-10-CM | POA: Diagnosis not present

## 2019-04-23 DIAGNOSIS — B9562 Methicillin resistant Staphylococcus aureus infection as the cause of diseases classified elsewhere: Secondary | ICD-10-CM | POA: Diagnosis not present

## 2019-04-23 DIAGNOSIS — B965 Pseudomonas (aeruginosa) (mallei) (pseudomallei) as the cause of diseases classified elsewhere: Secondary | ICD-10-CM | POA: Diagnosis not present

## 2019-04-23 DIAGNOSIS — M86172 Other acute osteomyelitis, left ankle and foot: Secondary | ICD-10-CM | POA: Diagnosis not present

## 2019-04-23 DIAGNOSIS — E11621 Type 2 diabetes mellitus with foot ulcer: Secondary | ICD-10-CM | POA: Diagnosis not present

## 2019-04-24 DIAGNOSIS — M86171 Other acute osteomyelitis, right ankle and foot: Secondary | ICD-10-CM | POA: Diagnosis not present

## 2019-04-24 DIAGNOSIS — B9562 Methicillin resistant Staphylococcus aureus infection as the cause of diseases classified elsewhere: Secondary | ICD-10-CM | POA: Diagnosis not present

## 2019-04-24 DIAGNOSIS — M86172 Other acute osteomyelitis, left ankle and foot: Secondary | ICD-10-CM | POA: Diagnosis not present

## 2019-04-24 DIAGNOSIS — B965 Pseudomonas (aeruginosa) (mallei) (pseudomallei) as the cause of diseases classified elsewhere: Secondary | ICD-10-CM | POA: Diagnosis not present

## 2019-04-24 DIAGNOSIS — E11621 Type 2 diabetes mellitus with foot ulcer: Secondary | ICD-10-CM | POA: Diagnosis not present

## 2019-04-24 DIAGNOSIS — E1169 Type 2 diabetes mellitus with other specified complication: Secondary | ICD-10-CM | POA: Diagnosis not present

## 2019-04-24 DIAGNOSIS — I96 Gangrene, not elsewhere classified: Secondary | ICD-10-CM | POA: Diagnosis not present

## 2019-04-25 DIAGNOSIS — B9562 Methicillin resistant Staphylococcus aureus infection as the cause of diseases classified elsewhere: Secondary | ICD-10-CM | POA: Diagnosis not present

## 2019-04-25 DIAGNOSIS — M86171 Other acute osteomyelitis, right ankle and foot: Secondary | ICD-10-CM | POA: Diagnosis not present

## 2019-04-25 DIAGNOSIS — E11621 Type 2 diabetes mellitus with foot ulcer: Secondary | ICD-10-CM | POA: Diagnosis not present

## 2019-04-25 DIAGNOSIS — M86172 Other acute osteomyelitis, left ankle and foot: Secondary | ICD-10-CM | POA: Diagnosis not present

## 2019-04-25 DIAGNOSIS — E1169 Type 2 diabetes mellitus with other specified complication: Secondary | ICD-10-CM | POA: Diagnosis not present

## 2019-04-25 DIAGNOSIS — B965 Pseudomonas (aeruginosa) (mallei) (pseudomallei) as the cause of diseases classified elsewhere: Secondary | ICD-10-CM | POA: Diagnosis not present

## 2019-04-28 DIAGNOSIS — M86171 Other acute osteomyelitis, right ankle and foot: Secondary | ICD-10-CM | POA: Diagnosis not present

## 2019-04-28 DIAGNOSIS — B965 Pseudomonas (aeruginosa) (mallei) (pseudomallei) as the cause of diseases classified elsewhere: Secondary | ICD-10-CM | POA: Diagnosis not present

## 2019-04-28 DIAGNOSIS — E1169 Type 2 diabetes mellitus with other specified complication: Secondary | ICD-10-CM | POA: Diagnosis not present

## 2019-04-28 DIAGNOSIS — B9562 Methicillin resistant Staphylococcus aureus infection as the cause of diseases classified elsewhere: Secondary | ICD-10-CM | POA: Diagnosis not present

## 2019-04-28 DIAGNOSIS — I96 Gangrene, not elsewhere classified: Secondary | ICD-10-CM | POA: Diagnosis not present

## 2019-04-28 DIAGNOSIS — M86172 Other acute osteomyelitis, left ankle and foot: Secondary | ICD-10-CM | POA: Diagnosis not present

## 2019-04-28 DIAGNOSIS — E11621 Type 2 diabetes mellitus with foot ulcer: Secondary | ICD-10-CM | POA: Diagnosis not present

## 2019-04-29 DIAGNOSIS — B965 Pseudomonas (aeruginosa) (mallei) (pseudomallei) as the cause of diseases classified elsewhere: Secondary | ICD-10-CM | POA: Diagnosis not present

## 2019-04-29 DIAGNOSIS — B9562 Methicillin resistant Staphylococcus aureus infection as the cause of diseases classified elsewhere: Secondary | ICD-10-CM | POA: Diagnosis not present

## 2019-04-29 DIAGNOSIS — M86172 Other acute osteomyelitis, left ankle and foot: Secondary | ICD-10-CM | POA: Diagnosis not present

## 2019-04-29 DIAGNOSIS — M86171 Other acute osteomyelitis, right ankle and foot: Secondary | ICD-10-CM | POA: Diagnosis not present

## 2019-04-29 DIAGNOSIS — E1169 Type 2 diabetes mellitus with other specified complication: Secondary | ICD-10-CM | POA: Diagnosis not present

## 2019-04-29 DIAGNOSIS — E11621 Type 2 diabetes mellitus with foot ulcer: Secondary | ICD-10-CM | POA: Diagnosis not present

## 2019-04-30 DIAGNOSIS — M86172 Other acute osteomyelitis, left ankle and foot: Secondary | ICD-10-CM | POA: Diagnosis not present

## 2019-04-30 DIAGNOSIS — E1169 Type 2 diabetes mellitus with other specified complication: Secondary | ICD-10-CM | POA: Diagnosis not present

## 2019-04-30 DIAGNOSIS — M86171 Other acute osteomyelitis, right ankle and foot: Secondary | ICD-10-CM | POA: Diagnosis not present

## 2019-04-30 DIAGNOSIS — E11621 Type 2 diabetes mellitus with foot ulcer: Secondary | ICD-10-CM | POA: Diagnosis not present

## 2019-04-30 DIAGNOSIS — B965 Pseudomonas (aeruginosa) (mallei) (pseudomallei) as the cause of diseases classified elsewhere: Secondary | ICD-10-CM | POA: Diagnosis not present

## 2019-04-30 DIAGNOSIS — B9562 Methicillin resistant Staphylococcus aureus infection as the cause of diseases classified elsewhere: Secondary | ICD-10-CM | POA: Diagnosis not present

## 2019-05-01 DIAGNOSIS — M86171 Other acute osteomyelitis, right ankle and foot: Secondary | ICD-10-CM | POA: Diagnosis not present

## 2019-05-01 DIAGNOSIS — M86172 Other acute osteomyelitis, left ankle and foot: Secondary | ICD-10-CM | POA: Diagnosis not present

## 2019-05-01 DIAGNOSIS — E11621 Type 2 diabetes mellitus with foot ulcer: Secondary | ICD-10-CM | POA: Diagnosis not present

## 2019-05-01 DIAGNOSIS — B965 Pseudomonas (aeruginosa) (mallei) (pseudomallei) as the cause of diseases classified elsewhere: Secondary | ICD-10-CM | POA: Diagnosis not present

## 2019-05-01 DIAGNOSIS — E1169 Type 2 diabetes mellitus with other specified complication: Secondary | ICD-10-CM | POA: Diagnosis not present

## 2019-05-01 DIAGNOSIS — B9562 Methicillin resistant Staphylococcus aureus infection as the cause of diseases classified elsewhere: Secondary | ICD-10-CM | POA: Diagnosis not present

## 2019-05-02 DIAGNOSIS — M797 Fibromyalgia: Secondary | ICD-10-CM | POA: Diagnosis not present

## 2019-05-02 DIAGNOSIS — I059 Rheumatic mitral valve disease, unspecified: Secondary | ICD-10-CM | POA: Diagnosis not present

## 2019-05-02 DIAGNOSIS — B965 Pseudomonas (aeruginosa) (mallei) (pseudomallei) as the cause of diseases classified elsewhere: Secondary | ICD-10-CM | POA: Diagnosis not present

## 2019-05-02 DIAGNOSIS — M961 Postlaminectomy syndrome, not elsewhere classified: Secondary | ICD-10-CM | POA: Diagnosis not present

## 2019-05-02 DIAGNOSIS — E1169 Type 2 diabetes mellitus with other specified complication: Secondary | ICD-10-CM | POA: Diagnosis not present

## 2019-05-02 DIAGNOSIS — M86172 Other acute osteomyelitis, left ankle and foot: Secondary | ICD-10-CM | POA: Diagnosis not present

## 2019-05-02 DIAGNOSIS — M86171 Other acute osteomyelitis, right ankle and foot: Secondary | ICD-10-CM | POA: Diagnosis not present

## 2019-05-02 DIAGNOSIS — B9562 Methicillin resistant Staphylococcus aureus infection as the cause of diseases classified elsewhere: Secondary | ICD-10-CM | POA: Diagnosis not present

## 2019-05-02 DIAGNOSIS — G894 Chronic pain syndrome: Secondary | ICD-10-CM | POA: Diagnosis not present

## 2019-05-02 DIAGNOSIS — E11621 Type 2 diabetes mellitus with foot ulcer: Secondary | ICD-10-CM | POA: Diagnosis not present

## 2019-05-05 DIAGNOSIS — B965 Pseudomonas (aeruginosa) (mallei) (pseudomallei) as the cause of diseases classified elsewhere: Secondary | ICD-10-CM | POA: Diagnosis not present

## 2019-05-05 DIAGNOSIS — E1169 Type 2 diabetes mellitus with other specified complication: Secondary | ICD-10-CM | POA: Diagnosis not present

## 2019-05-05 DIAGNOSIS — M86171 Other acute osteomyelitis, right ankle and foot: Secondary | ICD-10-CM | POA: Diagnosis not present

## 2019-05-05 DIAGNOSIS — E11621 Type 2 diabetes mellitus with foot ulcer: Secondary | ICD-10-CM | POA: Diagnosis not present

## 2019-05-05 DIAGNOSIS — I96 Gangrene, not elsewhere classified: Secondary | ICD-10-CM | POA: Diagnosis not present

## 2019-05-05 DIAGNOSIS — B9562 Methicillin resistant Staphylococcus aureus infection as the cause of diseases classified elsewhere: Secondary | ICD-10-CM | POA: Diagnosis not present

## 2019-05-05 DIAGNOSIS — M86172 Other acute osteomyelitis, left ankle and foot: Secondary | ICD-10-CM | POA: Diagnosis not present

## 2019-05-06 DIAGNOSIS — E1169 Type 2 diabetes mellitus with other specified complication: Secondary | ICD-10-CM | POA: Diagnosis not present

## 2019-05-06 DIAGNOSIS — E119 Type 2 diabetes mellitus without complications: Secondary | ICD-10-CM | POA: Diagnosis not present

## 2019-05-06 DIAGNOSIS — I509 Heart failure, unspecified: Secondary | ICD-10-CM | POA: Diagnosis not present

## 2019-05-06 DIAGNOSIS — D649 Anemia, unspecified: Secondary | ICD-10-CM | POA: Diagnosis not present

## 2019-05-06 DIAGNOSIS — Z4801 Encounter for change or removal of surgical wound dressing: Secondary | ICD-10-CM | POA: Diagnosis not present

## 2019-05-06 DIAGNOSIS — S98132D Complete traumatic amputation of one left lesser toe, subsequent encounter: Secondary | ICD-10-CM | POA: Diagnosis not present

## 2019-05-06 DIAGNOSIS — Z9049 Acquired absence of other specified parts of digestive tract: Secondary | ICD-10-CM | POA: Diagnosis not present

## 2019-05-06 DIAGNOSIS — Z8249 Family history of ischemic heart disease and other diseases of the circulatory system: Secondary | ICD-10-CM | POA: Diagnosis not present

## 2019-05-06 DIAGNOSIS — Z89422 Acquired absence of other left toe(s): Secondary | ICD-10-CM | POA: Diagnosis not present

## 2019-05-06 DIAGNOSIS — Z4800 Encounter for change or removal of nonsurgical wound dressing: Secondary | ICD-10-CM | POA: Diagnosis not present

## 2019-05-06 DIAGNOSIS — I10 Essential (primary) hypertension: Secondary | ICD-10-CM | POA: Diagnosis not present

## 2019-05-06 DIAGNOSIS — F17210 Nicotine dependence, cigarettes, uncomplicated: Secondary | ICD-10-CM | POA: Diagnosis not present

## 2019-05-06 DIAGNOSIS — E1151 Type 2 diabetes mellitus with diabetic peripheral angiopathy without gangrene: Secondary | ICD-10-CM | POA: Diagnosis not present

## 2019-05-06 DIAGNOSIS — I2510 Atherosclerotic heart disease of native coronary artery without angina pectoris: Secondary | ICD-10-CM | POA: Diagnosis not present

## 2019-05-06 DIAGNOSIS — I110 Hypertensive heart disease with heart failure: Secondary | ICD-10-CM | POA: Diagnosis not present

## 2019-05-06 DIAGNOSIS — Z8673 Personal history of transient ischemic attack (TIA), and cerebral infarction without residual deficits: Secondary | ICD-10-CM | POA: Diagnosis not present

## 2019-05-06 DIAGNOSIS — Z955 Presence of coronary angioplasty implant and graft: Secondary | ICD-10-CM | POA: Diagnosis not present

## 2019-05-06 DIAGNOSIS — M86279 Subacute osteomyelitis, unspecified ankle and foot: Secondary | ICD-10-CM | POA: Diagnosis not present

## 2019-05-06 DIAGNOSIS — E1140 Type 2 diabetes mellitus with diabetic neuropathy, unspecified: Secondary | ICD-10-CM | POA: Diagnosis not present

## 2019-05-07 DIAGNOSIS — E1169 Type 2 diabetes mellitus with other specified complication: Secondary | ICD-10-CM | POA: Diagnosis not present

## 2019-05-07 DIAGNOSIS — B965 Pseudomonas (aeruginosa) (mallei) (pseudomallei) as the cause of diseases classified elsewhere: Secondary | ICD-10-CM | POA: Diagnosis not present

## 2019-05-07 DIAGNOSIS — E11621 Type 2 diabetes mellitus with foot ulcer: Secondary | ICD-10-CM | POA: Diagnosis not present

## 2019-05-07 DIAGNOSIS — B9562 Methicillin resistant Staphylococcus aureus infection as the cause of diseases classified elsewhere: Secondary | ICD-10-CM | POA: Diagnosis not present

## 2019-05-07 DIAGNOSIS — M86172 Other acute osteomyelitis, left ankle and foot: Secondary | ICD-10-CM | POA: Diagnosis not present

## 2019-05-07 DIAGNOSIS — M86171 Other acute osteomyelitis, right ankle and foot: Secondary | ICD-10-CM | POA: Diagnosis not present

## 2019-05-08 DIAGNOSIS — M86172 Other acute osteomyelitis, left ankle and foot: Secondary | ICD-10-CM | POA: Diagnosis not present

## 2019-05-08 DIAGNOSIS — B9562 Methicillin resistant Staphylococcus aureus infection as the cause of diseases classified elsewhere: Secondary | ICD-10-CM | POA: Diagnosis not present

## 2019-05-08 DIAGNOSIS — B965 Pseudomonas (aeruginosa) (mallei) (pseudomallei) as the cause of diseases classified elsewhere: Secondary | ICD-10-CM | POA: Diagnosis not present

## 2019-05-08 DIAGNOSIS — M86171 Other acute osteomyelitis, right ankle and foot: Secondary | ICD-10-CM | POA: Diagnosis not present

## 2019-05-08 DIAGNOSIS — E1169 Type 2 diabetes mellitus with other specified complication: Secondary | ICD-10-CM | POA: Diagnosis not present

## 2019-05-08 DIAGNOSIS — E11621 Type 2 diabetes mellitus with foot ulcer: Secondary | ICD-10-CM | POA: Diagnosis not present

## 2019-05-09 DIAGNOSIS — I2510 Atherosclerotic heart disease of native coronary artery without angina pectoris: Secondary | ICD-10-CM | POA: Diagnosis not present

## 2019-05-09 DIAGNOSIS — I5020 Unspecified systolic (congestive) heart failure: Secondary | ICD-10-CM | POA: Diagnosis not present

## 2019-05-09 DIAGNOSIS — B965 Pseudomonas (aeruginosa) (mallei) (pseudomallei) as the cause of diseases classified elsewhere: Secondary | ICD-10-CM | POA: Diagnosis not present

## 2019-05-09 DIAGNOSIS — M86172 Other acute osteomyelitis, left ankle and foot: Secondary | ICD-10-CM | POA: Diagnosis not present

## 2019-05-09 DIAGNOSIS — Z452 Encounter for adjustment and management of vascular access device: Secondary | ICD-10-CM | POA: Diagnosis not present

## 2019-05-09 DIAGNOSIS — B9562 Methicillin resistant Staphylococcus aureus infection as the cause of diseases classified elsewhere: Secondary | ICD-10-CM | POA: Diagnosis not present

## 2019-05-09 DIAGNOSIS — R3914 Feeling of incomplete bladder emptying: Secondary | ICD-10-CM | POA: Diagnosis not present

## 2019-05-09 DIAGNOSIS — R32 Unspecified urinary incontinence: Secondary | ICD-10-CM | POA: Diagnosis not present

## 2019-05-09 DIAGNOSIS — Z89432 Acquired absence of left foot: Secondary | ICD-10-CM | POA: Diagnosis not present

## 2019-05-09 DIAGNOSIS — I110 Hypertensive heart disease with heart failure: Secondary | ICD-10-CM | POA: Diagnosis not present

## 2019-05-09 DIAGNOSIS — L97416 Non-pressure chronic ulcer of right heel and midfoot with bone involvement without evidence of necrosis: Secondary | ICD-10-CM | POA: Diagnosis not present

## 2019-05-09 DIAGNOSIS — Z89422 Acquired absence of other left toe(s): Secondary | ICD-10-CM | POA: Diagnosis not present

## 2019-05-09 DIAGNOSIS — E1152 Type 2 diabetes mellitus with diabetic peripheral angiopathy with gangrene: Secondary | ICD-10-CM | POA: Diagnosis not present

## 2019-05-09 DIAGNOSIS — G4700 Insomnia, unspecified: Secondary | ICD-10-CM | POA: Diagnosis not present

## 2019-05-09 DIAGNOSIS — I252 Old myocardial infarction: Secondary | ICD-10-CM | POA: Diagnosis not present

## 2019-05-09 DIAGNOSIS — Z5181 Encounter for therapeutic drug level monitoring: Secondary | ICD-10-CM | POA: Diagnosis not present

## 2019-05-09 DIAGNOSIS — E1169 Type 2 diabetes mellitus with other specified complication: Secondary | ICD-10-CM | POA: Diagnosis not present

## 2019-05-09 DIAGNOSIS — D649 Anemia, unspecified: Secondary | ICD-10-CM | POA: Diagnosis not present

## 2019-05-09 DIAGNOSIS — E11621 Type 2 diabetes mellitus with foot ulcer: Secondary | ICD-10-CM | POA: Diagnosis not present

## 2019-05-09 DIAGNOSIS — M86171 Other acute osteomyelitis, right ankle and foot: Secondary | ICD-10-CM | POA: Diagnosis not present

## 2019-05-09 DIAGNOSIS — N179 Acute kidney failure, unspecified: Secondary | ICD-10-CM | POA: Diagnosis not present

## 2019-05-09 DIAGNOSIS — N319 Neuromuscular dysfunction of bladder, unspecified: Secondary | ICD-10-CM | POA: Diagnosis not present

## 2019-05-09 DIAGNOSIS — F17210 Nicotine dependence, cigarettes, uncomplicated: Secondary | ICD-10-CM | POA: Diagnosis not present

## 2019-05-09 DIAGNOSIS — K5900 Constipation, unspecified: Secondary | ICD-10-CM | POA: Diagnosis not present

## 2019-05-09 DIAGNOSIS — M797 Fibromyalgia: Secondary | ICD-10-CM | POA: Diagnosis not present

## 2019-05-12 DIAGNOSIS — B9562 Methicillin resistant Staphylococcus aureus infection as the cause of diseases classified elsewhere: Secondary | ICD-10-CM | POA: Diagnosis not present

## 2019-05-12 DIAGNOSIS — I96 Gangrene, not elsewhere classified: Secondary | ICD-10-CM | POA: Diagnosis not present

## 2019-05-12 DIAGNOSIS — M86171 Other acute osteomyelitis, right ankle and foot: Secondary | ICD-10-CM | POA: Diagnosis not present

## 2019-05-12 DIAGNOSIS — E11621 Type 2 diabetes mellitus with foot ulcer: Secondary | ICD-10-CM | POA: Diagnosis not present

## 2019-05-12 DIAGNOSIS — B965 Pseudomonas (aeruginosa) (mallei) (pseudomallei) as the cause of diseases classified elsewhere: Secondary | ICD-10-CM | POA: Diagnosis not present

## 2019-05-12 DIAGNOSIS — M86172 Other acute osteomyelitis, left ankle and foot: Secondary | ICD-10-CM | POA: Diagnosis not present

## 2019-05-12 DIAGNOSIS — E1169 Type 2 diabetes mellitus with other specified complication: Secondary | ICD-10-CM | POA: Diagnosis not present

## 2019-05-14 DIAGNOSIS — E1169 Type 2 diabetes mellitus with other specified complication: Secondary | ICD-10-CM | POA: Diagnosis not present

## 2019-05-14 DIAGNOSIS — E889 Metabolic disorder, unspecified: Secondary | ICD-10-CM | POA: Diagnosis not present

## 2019-05-14 DIAGNOSIS — L03119 Cellulitis of unspecified part of limb: Secondary | ICD-10-CM | POA: Diagnosis not present

## 2019-05-14 DIAGNOSIS — M961 Postlaminectomy syndrome, not elsewhere classified: Secondary | ICD-10-CM | POA: Diagnosis not present

## 2019-05-15 DIAGNOSIS — E11621 Type 2 diabetes mellitus with foot ulcer: Secondary | ICD-10-CM | POA: Diagnosis not present

## 2019-05-15 DIAGNOSIS — E1169 Type 2 diabetes mellitus with other specified complication: Secondary | ICD-10-CM | POA: Diagnosis not present

## 2019-05-15 DIAGNOSIS — B9562 Methicillin resistant Staphylococcus aureus infection as the cause of diseases classified elsewhere: Secondary | ICD-10-CM | POA: Diagnosis not present

## 2019-05-15 DIAGNOSIS — Z4789 Encounter for other orthopedic aftercare: Secondary | ICD-10-CM | POA: Diagnosis not present

## 2019-05-15 DIAGNOSIS — M86172 Other acute osteomyelitis, left ankle and foot: Secondary | ICD-10-CM | POA: Diagnosis not present

## 2019-05-15 DIAGNOSIS — M00862 Arthritis due to other bacteria, left knee: Secondary | ICD-10-CM | POA: Diagnosis not present

## 2019-05-15 DIAGNOSIS — M86171 Other acute osteomyelitis, right ankle and foot: Secondary | ICD-10-CM | POA: Diagnosis not present

## 2019-05-15 DIAGNOSIS — B965 Pseudomonas (aeruginosa) (mallei) (pseudomallei) as the cause of diseases classified elsewhere: Secondary | ICD-10-CM | POA: Diagnosis not present

## 2019-05-18 DIAGNOSIS — M86172 Other acute osteomyelitis, left ankle and foot: Secondary | ICD-10-CM | POA: Diagnosis not present

## 2019-05-18 DIAGNOSIS — B9562 Methicillin resistant Staphylococcus aureus infection as the cause of diseases classified elsewhere: Secondary | ICD-10-CM | POA: Diagnosis not present

## 2019-05-18 DIAGNOSIS — E1169 Type 2 diabetes mellitus with other specified complication: Secondary | ICD-10-CM | POA: Diagnosis not present

## 2019-05-18 DIAGNOSIS — B965 Pseudomonas (aeruginosa) (mallei) (pseudomallei) as the cause of diseases classified elsewhere: Secondary | ICD-10-CM | POA: Diagnosis not present

## 2019-05-18 DIAGNOSIS — M86171 Other acute osteomyelitis, right ankle and foot: Secondary | ICD-10-CM | POA: Diagnosis not present

## 2019-05-18 DIAGNOSIS — E11621 Type 2 diabetes mellitus with foot ulcer: Secondary | ICD-10-CM | POA: Diagnosis not present

## 2019-05-19 DIAGNOSIS — E1169 Type 2 diabetes mellitus with other specified complication: Secondary | ICD-10-CM | POA: Diagnosis not present

## 2019-05-19 DIAGNOSIS — B965 Pseudomonas (aeruginosa) (mallei) (pseudomallei) as the cause of diseases classified elsewhere: Secondary | ICD-10-CM | POA: Diagnosis not present

## 2019-05-19 DIAGNOSIS — M86172 Other acute osteomyelitis, left ankle and foot: Secondary | ICD-10-CM | POA: Diagnosis not present

## 2019-05-19 DIAGNOSIS — B9562 Methicillin resistant Staphylococcus aureus infection as the cause of diseases classified elsewhere: Secondary | ICD-10-CM | POA: Diagnosis not present

## 2019-05-19 DIAGNOSIS — M86171 Other acute osteomyelitis, right ankle and foot: Secondary | ICD-10-CM | POA: Diagnosis not present

## 2019-05-19 DIAGNOSIS — E11621 Type 2 diabetes mellitus with foot ulcer: Secondary | ICD-10-CM | POA: Diagnosis not present

## 2019-05-21 DIAGNOSIS — F17200 Nicotine dependence, unspecified, uncomplicated: Secondary | ICD-10-CM | POA: Diagnosis not present

## 2019-05-21 DIAGNOSIS — Z09 Encounter for follow-up examination after completed treatment for conditions other than malignant neoplasm: Secondary | ICD-10-CM | POA: Diagnosis not present

## 2019-05-21 DIAGNOSIS — I110 Hypertensive heart disease with heart failure: Secondary | ICD-10-CM | POA: Diagnosis not present

## 2019-05-21 DIAGNOSIS — B9562 Methicillin resistant Staphylococcus aureus infection as the cause of diseases classified elsewhere: Secondary | ICD-10-CM | POA: Diagnosis not present

## 2019-05-21 DIAGNOSIS — B965 Pseudomonas (aeruginosa) (mallei) (pseudomallei) as the cause of diseases classified elsewhere: Secondary | ICD-10-CM | POA: Diagnosis not present

## 2019-05-21 DIAGNOSIS — J449 Chronic obstructive pulmonary disease, unspecified: Secondary | ICD-10-CM | POA: Diagnosis not present

## 2019-05-21 DIAGNOSIS — Z95820 Peripheral vascular angioplasty status with implants and grafts: Secondary | ICD-10-CM | POA: Diagnosis not present

## 2019-05-21 DIAGNOSIS — I5020 Unspecified systolic (congestive) heart failure: Secondary | ICD-10-CM | POA: Diagnosis not present

## 2019-05-21 DIAGNOSIS — I739 Peripheral vascular disease, unspecified: Secondary | ICD-10-CM | POA: Diagnosis not present

## 2019-05-21 DIAGNOSIS — E1151 Type 2 diabetes mellitus with diabetic peripheral angiopathy without gangrene: Secondary | ICD-10-CM | POA: Diagnosis not present

## 2019-05-21 DIAGNOSIS — E1169 Type 2 diabetes mellitus with other specified complication: Secondary | ICD-10-CM | POA: Diagnosis not present

## 2019-05-21 DIAGNOSIS — M86172 Other acute osteomyelitis, left ankle and foot: Secondary | ICD-10-CM | POA: Diagnosis not present

## 2019-05-21 DIAGNOSIS — Z794 Long term (current) use of insulin: Secondary | ICD-10-CM | POA: Diagnosis not present

## 2019-05-21 DIAGNOSIS — M86171 Other acute osteomyelitis, right ankle and foot: Secondary | ICD-10-CM | POA: Diagnosis not present

## 2019-05-21 DIAGNOSIS — I252 Old myocardial infarction: Secondary | ICD-10-CM | POA: Diagnosis not present

## 2019-05-21 DIAGNOSIS — E11621 Type 2 diabetes mellitus with foot ulcer: Secondary | ICD-10-CM | POA: Diagnosis not present

## 2019-05-23 DIAGNOSIS — E11621 Type 2 diabetes mellitus with foot ulcer: Secondary | ICD-10-CM | POA: Diagnosis not present

## 2019-05-23 DIAGNOSIS — M86171 Other acute osteomyelitis, right ankle and foot: Secondary | ICD-10-CM | POA: Diagnosis not present

## 2019-05-23 DIAGNOSIS — B965 Pseudomonas (aeruginosa) (mallei) (pseudomallei) as the cause of diseases classified elsewhere: Secondary | ICD-10-CM | POA: Diagnosis not present

## 2019-05-23 DIAGNOSIS — E1169 Type 2 diabetes mellitus with other specified complication: Secondary | ICD-10-CM | POA: Diagnosis not present

## 2019-05-23 DIAGNOSIS — M86172 Other acute osteomyelitis, left ankle and foot: Secondary | ICD-10-CM | POA: Diagnosis not present

## 2019-05-23 DIAGNOSIS — B9562 Methicillin resistant Staphylococcus aureus infection as the cause of diseases classified elsewhere: Secondary | ICD-10-CM | POA: Diagnosis not present

## 2019-05-25 DIAGNOSIS — M86171 Other acute osteomyelitis, right ankle and foot: Secondary | ICD-10-CM | POA: Diagnosis not present

## 2019-05-25 DIAGNOSIS — B965 Pseudomonas (aeruginosa) (mallei) (pseudomallei) as the cause of diseases classified elsewhere: Secondary | ICD-10-CM | POA: Diagnosis not present

## 2019-05-25 DIAGNOSIS — Z792 Long term (current) use of antibiotics: Secondary | ICD-10-CM | POA: Diagnosis not present

## 2019-05-25 DIAGNOSIS — E11621 Type 2 diabetes mellitus with foot ulcer: Secondary | ICD-10-CM | POA: Diagnosis not present

## 2019-05-25 DIAGNOSIS — M86172 Other acute osteomyelitis, left ankle and foot: Secondary | ICD-10-CM | POA: Diagnosis not present

## 2019-05-25 DIAGNOSIS — B9562 Methicillin resistant Staphylococcus aureus infection as the cause of diseases classified elsewhere: Secondary | ICD-10-CM | POA: Diagnosis not present

## 2019-05-25 DIAGNOSIS — E1169 Type 2 diabetes mellitus with other specified complication: Secondary | ICD-10-CM | POA: Diagnosis not present

## 2019-05-27 DIAGNOSIS — L8990 Pressure ulcer of unspecified site, unspecified stage: Secondary | ICD-10-CM | POA: Diagnosis not present

## 2019-05-27 DIAGNOSIS — M86279 Subacute osteomyelitis, unspecified ankle and foot: Secondary | ICD-10-CM | POA: Diagnosis not present

## 2019-05-27 DIAGNOSIS — I739 Peripheral vascular disease, unspecified: Secondary | ICD-10-CM | POA: Diagnosis not present

## 2019-05-27 DIAGNOSIS — D649 Anemia, unspecified: Secondary | ICD-10-CM | POA: Diagnosis not present

## 2019-05-28 DIAGNOSIS — B965 Pseudomonas (aeruginosa) (mallei) (pseudomallei) as the cause of diseases classified elsewhere: Secondary | ICD-10-CM | POA: Diagnosis not present

## 2019-05-28 DIAGNOSIS — M86171 Other acute osteomyelitis, right ankle and foot: Secondary | ICD-10-CM | POA: Diagnosis not present

## 2019-05-28 DIAGNOSIS — E11621 Type 2 diabetes mellitus with foot ulcer: Secondary | ICD-10-CM | POA: Diagnosis not present

## 2019-05-28 DIAGNOSIS — E1169 Type 2 diabetes mellitus with other specified complication: Secondary | ICD-10-CM | POA: Diagnosis not present

## 2019-05-28 DIAGNOSIS — M86172 Other acute osteomyelitis, left ankle and foot: Secondary | ICD-10-CM | POA: Diagnosis not present

## 2019-05-28 DIAGNOSIS — B9562 Methicillin resistant Staphylococcus aureus infection as the cause of diseases classified elsewhere: Secondary | ICD-10-CM | POA: Diagnosis not present

## 2019-05-30 DIAGNOSIS — M86171 Other acute osteomyelitis, right ankle and foot: Secondary | ICD-10-CM | POA: Diagnosis not present

## 2019-05-30 DIAGNOSIS — E1169 Type 2 diabetes mellitus with other specified complication: Secondary | ICD-10-CM | POA: Diagnosis not present

## 2019-05-30 DIAGNOSIS — E11621 Type 2 diabetes mellitus with foot ulcer: Secondary | ICD-10-CM | POA: Diagnosis not present

## 2019-05-30 DIAGNOSIS — M86172 Other acute osteomyelitis, left ankle and foot: Secondary | ICD-10-CM | POA: Diagnosis not present

## 2019-05-30 DIAGNOSIS — B9562 Methicillin resistant Staphylococcus aureus infection as the cause of diseases classified elsewhere: Secondary | ICD-10-CM | POA: Diagnosis not present

## 2019-05-30 DIAGNOSIS — B965 Pseudomonas (aeruginosa) (mallei) (pseudomallei) as the cause of diseases classified elsewhere: Secondary | ICD-10-CM | POA: Diagnosis not present

## 2019-06-02 DIAGNOSIS — M86172 Other acute osteomyelitis, left ankle and foot: Secondary | ICD-10-CM | POA: Diagnosis not present

## 2019-06-02 DIAGNOSIS — E1169 Type 2 diabetes mellitus with other specified complication: Secondary | ICD-10-CM | POA: Diagnosis not present

## 2019-06-02 DIAGNOSIS — Z8739 Personal history of other diseases of the musculoskeletal system and connective tissue: Secondary | ICD-10-CM | POA: Diagnosis not present

## 2019-06-02 DIAGNOSIS — E11621 Type 2 diabetes mellitus with foot ulcer: Secondary | ICD-10-CM | POA: Diagnosis not present

## 2019-06-02 DIAGNOSIS — B965 Pseudomonas (aeruginosa) (mallei) (pseudomallei) as the cause of diseases classified elsewhere: Secondary | ICD-10-CM | POA: Diagnosis not present

## 2019-06-02 DIAGNOSIS — M869 Osteomyelitis, unspecified: Secondary | ICD-10-CM | POA: Diagnosis not present

## 2019-06-02 DIAGNOSIS — M86171 Other acute osteomyelitis, right ankle and foot: Secondary | ICD-10-CM | POA: Diagnosis not present

## 2019-06-02 DIAGNOSIS — B9562 Methicillin resistant Staphylococcus aureus infection as the cause of diseases classified elsewhere: Secondary | ICD-10-CM | POA: Diagnosis not present

## 2019-06-02 DIAGNOSIS — Z452 Encounter for adjustment and management of vascular access device: Secondary | ICD-10-CM | POA: Diagnosis not present

## 2019-06-04 DIAGNOSIS — E11621 Type 2 diabetes mellitus with foot ulcer: Secondary | ICD-10-CM | POA: Diagnosis not present

## 2019-06-04 DIAGNOSIS — B965 Pseudomonas (aeruginosa) (mallei) (pseudomallei) as the cause of diseases classified elsewhere: Secondary | ICD-10-CM | POA: Diagnosis not present

## 2019-06-04 DIAGNOSIS — E1169 Type 2 diabetes mellitus with other specified complication: Secondary | ICD-10-CM | POA: Diagnosis not present

## 2019-06-04 DIAGNOSIS — M86172 Other acute osteomyelitis, left ankle and foot: Secondary | ICD-10-CM | POA: Diagnosis not present

## 2019-06-04 DIAGNOSIS — B9562 Methicillin resistant Staphylococcus aureus infection as the cause of diseases classified elsewhere: Secondary | ICD-10-CM | POA: Diagnosis not present

## 2019-06-04 DIAGNOSIS — M86171 Other acute osteomyelitis, right ankle and foot: Secondary | ICD-10-CM | POA: Diagnosis not present

## 2019-06-05 DIAGNOSIS — E1169 Type 2 diabetes mellitus with other specified complication: Secondary | ICD-10-CM | POA: Diagnosis not present

## 2019-06-05 DIAGNOSIS — M86171 Other acute osteomyelitis, right ankle and foot: Secondary | ICD-10-CM | POA: Diagnosis not present

## 2019-06-05 DIAGNOSIS — E11621 Type 2 diabetes mellitus with foot ulcer: Secondary | ICD-10-CM | POA: Diagnosis not present

## 2019-06-05 DIAGNOSIS — B9562 Methicillin resistant Staphylococcus aureus infection as the cause of diseases classified elsewhere: Secondary | ICD-10-CM | POA: Diagnosis not present

## 2019-06-05 DIAGNOSIS — M86172 Other acute osteomyelitis, left ankle and foot: Secondary | ICD-10-CM | POA: Diagnosis not present

## 2019-06-05 DIAGNOSIS — B965 Pseudomonas (aeruginosa) (mallei) (pseudomallei) as the cause of diseases classified elsewhere: Secondary | ICD-10-CM | POA: Diagnosis not present

## 2019-06-06 DIAGNOSIS — B9562 Methicillin resistant Staphylococcus aureus infection as the cause of diseases classified elsewhere: Secondary | ICD-10-CM | POA: Diagnosis not present

## 2019-06-06 DIAGNOSIS — M86172 Other acute osteomyelitis, left ankle and foot: Secondary | ICD-10-CM | POA: Diagnosis not present

## 2019-06-06 DIAGNOSIS — B965 Pseudomonas (aeruginosa) (mallei) (pseudomallei) as the cause of diseases classified elsewhere: Secondary | ICD-10-CM | POA: Diagnosis not present

## 2019-06-06 DIAGNOSIS — E1169 Type 2 diabetes mellitus with other specified complication: Secondary | ICD-10-CM | POA: Diagnosis not present

## 2019-06-06 DIAGNOSIS — E11621 Type 2 diabetes mellitus with foot ulcer: Secondary | ICD-10-CM | POA: Diagnosis not present

## 2019-06-06 DIAGNOSIS — M86171 Other acute osteomyelitis, right ankle and foot: Secondary | ICD-10-CM | POA: Diagnosis not present

## 2019-06-08 DIAGNOSIS — F17210 Nicotine dependence, cigarettes, uncomplicated: Secondary | ICD-10-CM | POA: Diagnosis not present

## 2019-06-08 DIAGNOSIS — E1152 Type 2 diabetes mellitus with diabetic peripheral angiopathy with gangrene: Secondary | ICD-10-CM | POA: Diagnosis not present

## 2019-06-08 DIAGNOSIS — E11621 Type 2 diabetes mellitus with foot ulcer: Secondary | ICD-10-CM | POA: Diagnosis not present

## 2019-06-08 DIAGNOSIS — I252 Old myocardial infarction: Secondary | ICD-10-CM | POA: Diagnosis not present

## 2019-06-08 DIAGNOSIS — R32 Unspecified urinary incontinence: Secondary | ICD-10-CM | POA: Diagnosis not present

## 2019-06-08 DIAGNOSIS — L97416 Non-pressure chronic ulcer of right heel and midfoot with bone involvement without evidence of necrosis: Secondary | ICD-10-CM | POA: Diagnosis not present

## 2019-06-08 DIAGNOSIS — G4700 Insomnia, unspecified: Secondary | ICD-10-CM | POA: Diagnosis not present

## 2019-06-08 DIAGNOSIS — B9562 Methicillin resistant Staphylococcus aureus infection as the cause of diseases classified elsewhere: Secondary | ICD-10-CM | POA: Diagnosis not present

## 2019-06-08 DIAGNOSIS — M86172 Other acute osteomyelitis, left ankle and foot: Secondary | ICD-10-CM | POA: Diagnosis not present

## 2019-06-08 DIAGNOSIS — B965 Pseudomonas (aeruginosa) (mallei) (pseudomallei) as the cause of diseases classified elsewhere: Secondary | ICD-10-CM | POA: Diagnosis not present

## 2019-06-08 DIAGNOSIS — M797 Fibromyalgia: Secondary | ICD-10-CM | POA: Diagnosis not present

## 2019-06-08 DIAGNOSIS — I5020 Unspecified systolic (congestive) heart failure: Secondary | ICD-10-CM | POA: Diagnosis not present

## 2019-06-08 DIAGNOSIS — D649 Anemia, unspecified: Secondary | ICD-10-CM | POA: Diagnosis not present

## 2019-06-08 DIAGNOSIS — L97426 Non-pressure chronic ulcer of left heel and midfoot with bone involvement without evidence of necrosis: Secondary | ICD-10-CM | POA: Diagnosis not present

## 2019-06-08 DIAGNOSIS — I110 Hypertensive heart disease with heart failure: Secondary | ICD-10-CM | POA: Diagnosis not present

## 2019-06-08 DIAGNOSIS — E1169 Type 2 diabetes mellitus with other specified complication: Secondary | ICD-10-CM | POA: Diagnosis not present

## 2019-06-08 DIAGNOSIS — Z4781 Encounter for orthopedic aftercare following surgical amputation: Secondary | ICD-10-CM | POA: Diagnosis not present

## 2019-06-08 DIAGNOSIS — I2510 Atherosclerotic heart disease of native coronary artery without angina pectoris: Secondary | ICD-10-CM | POA: Diagnosis not present

## 2019-06-08 DIAGNOSIS — N319 Neuromuscular dysfunction of bladder, unspecified: Secondary | ICD-10-CM | POA: Diagnosis not present

## 2019-06-08 DIAGNOSIS — K5900 Constipation, unspecified: Secondary | ICD-10-CM | POA: Diagnosis not present

## 2019-06-08 DIAGNOSIS — Z4801 Encounter for change or removal of surgical wound dressing: Secondary | ICD-10-CM | POA: Diagnosis not present

## 2019-06-08 DIAGNOSIS — R3914 Feeling of incomplete bladder emptying: Secondary | ICD-10-CM | POA: Diagnosis not present

## 2019-06-08 DIAGNOSIS — M86171 Other acute osteomyelitis, right ankle and foot: Secondary | ICD-10-CM | POA: Diagnosis not present

## 2019-06-08 DIAGNOSIS — N179 Acute kidney failure, unspecified: Secondary | ICD-10-CM | POA: Diagnosis not present

## 2019-06-08 DIAGNOSIS — M545 Low back pain: Secondary | ICD-10-CM | POA: Diagnosis not present

## 2019-06-09 DIAGNOSIS — L97416 Non-pressure chronic ulcer of right heel and midfoot with bone involvement without evidence of necrosis: Secondary | ICD-10-CM | POA: Diagnosis not present

## 2019-06-09 DIAGNOSIS — E1169 Type 2 diabetes mellitus with other specified complication: Secondary | ICD-10-CM | POA: Diagnosis not present

## 2019-06-09 DIAGNOSIS — E11621 Type 2 diabetes mellitus with foot ulcer: Secondary | ICD-10-CM | POA: Diagnosis not present

## 2019-06-09 DIAGNOSIS — M86171 Other acute osteomyelitis, right ankle and foot: Secondary | ICD-10-CM | POA: Diagnosis not present

## 2019-06-09 DIAGNOSIS — L97426 Non-pressure chronic ulcer of left heel and midfoot with bone involvement without evidence of necrosis: Secondary | ICD-10-CM | POA: Diagnosis not present

## 2019-06-09 DIAGNOSIS — E1152 Type 2 diabetes mellitus with diabetic peripheral angiopathy with gangrene: Secondary | ICD-10-CM | POA: Diagnosis not present

## 2019-06-11 DIAGNOSIS — E11621 Type 2 diabetes mellitus with foot ulcer: Secondary | ICD-10-CM | POA: Diagnosis not present

## 2019-06-11 DIAGNOSIS — L97416 Non-pressure chronic ulcer of right heel and midfoot with bone involvement without evidence of necrosis: Secondary | ICD-10-CM | POA: Diagnosis not present

## 2019-06-11 DIAGNOSIS — E1169 Type 2 diabetes mellitus with other specified complication: Secondary | ICD-10-CM | POA: Diagnosis not present

## 2019-06-11 DIAGNOSIS — M86171 Other acute osteomyelitis, right ankle and foot: Secondary | ICD-10-CM | POA: Diagnosis not present

## 2019-06-11 DIAGNOSIS — L97426 Non-pressure chronic ulcer of left heel and midfoot with bone involvement without evidence of necrosis: Secondary | ICD-10-CM | POA: Diagnosis not present

## 2019-06-11 DIAGNOSIS — E1152 Type 2 diabetes mellitus with diabetic peripheral angiopathy with gangrene: Secondary | ICD-10-CM | POA: Diagnosis not present

## 2019-06-13 DIAGNOSIS — E11621 Type 2 diabetes mellitus with foot ulcer: Secondary | ICD-10-CM | POA: Diagnosis not present

## 2019-06-13 DIAGNOSIS — E1152 Type 2 diabetes mellitus with diabetic peripheral angiopathy with gangrene: Secondary | ICD-10-CM | POA: Diagnosis not present

## 2019-06-13 DIAGNOSIS — E1169 Type 2 diabetes mellitus with other specified complication: Secondary | ICD-10-CM | POA: Diagnosis not present

## 2019-06-13 DIAGNOSIS — L97426 Non-pressure chronic ulcer of left heel and midfoot with bone involvement without evidence of necrosis: Secondary | ICD-10-CM | POA: Diagnosis not present

## 2019-06-13 DIAGNOSIS — M86171 Other acute osteomyelitis, right ankle and foot: Secondary | ICD-10-CM | POA: Diagnosis not present

## 2019-06-13 DIAGNOSIS — L97416 Non-pressure chronic ulcer of right heel and midfoot with bone involvement without evidence of necrosis: Secondary | ICD-10-CM | POA: Diagnosis not present

## 2019-06-16 DIAGNOSIS — L97416 Non-pressure chronic ulcer of right heel and midfoot with bone involvement without evidence of necrosis: Secondary | ICD-10-CM | POA: Diagnosis not present

## 2019-06-16 DIAGNOSIS — M86171 Other acute osteomyelitis, right ankle and foot: Secondary | ICD-10-CM | POA: Diagnosis not present

## 2019-06-16 DIAGNOSIS — E11621 Type 2 diabetes mellitus with foot ulcer: Secondary | ICD-10-CM | POA: Diagnosis not present

## 2019-06-16 DIAGNOSIS — E1152 Type 2 diabetes mellitus with diabetic peripheral angiopathy with gangrene: Secondary | ICD-10-CM | POA: Diagnosis not present

## 2019-06-16 DIAGNOSIS — E1169 Type 2 diabetes mellitus with other specified complication: Secondary | ICD-10-CM | POA: Diagnosis not present

## 2019-06-16 DIAGNOSIS — L97426 Non-pressure chronic ulcer of left heel and midfoot with bone involvement without evidence of necrosis: Secondary | ICD-10-CM | POA: Diagnosis not present

## 2019-06-17 DIAGNOSIS — M86279 Subacute osteomyelitis, unspecified ankle and foot: Secondary | ICD-10-CM | POA: Diagnosis not present

## 2019-06-17 DIAGNOSIS — L89619 Pressure ulcer of right heel, unspecified stage: Secondary | ICD-10-CM | POA: Diagnosis not present

## 2019-06-17 DIAGNOSIS — Z09 Encounter for follow-up examination after completed treatment for conditions other than malignant neoplasm: Secondary | ICD-10-CM | POA: Diagnosis not present

## 2019-06-18 DIAGNOSIS — L97426 Non-pressure chronic ulcer of left heel and midfoot with bone involvement without evidence of necrosis: Secondary | ICD-10-CM | POA: Diagnosis not present

## 2019-06-18 DIAGNOSIS — E1152 Type 2 diabetes mellitus with diabetic peripheral angiopathy with gangrene: Secondary | ICD-10-CM | POA: Diagnosis not present

## 2019-06-18 DIAGNOSIS — L97416 Non-pressure chronic ulcer of right heel and midfoot with bone involvement without evidence of necrosis: Secondary | ICD-10-CM | POA: Diagnosis not present

## 2019-06-18 DIAGNOSIS — E1169 Type 2 diabetes mellitus with other specified complication: Secondary | ICD-10-CM | POA: Diagnosis not present

## 2019-06-18 DIAGNOSIS — M86171 Other acute osteomyelitis, right ankle and foot: Secondary | ICD-10-CM | POA: Diagnosis not present

## 2019-06-18 DIAGNOSIS — E11621 Type 2 diabetes mellitus with foot ulcer: Secondary | ICD-10-CM | POA: Diagnosis not present

## 2019-06-20 DIAGNOSIS — E1169 Type 2 diabetes mellitus with other specified complication: Secondary | ICD-10-CM | POA: Diagnosis not present

## 2019-06-20 DIAGNOSIS — E1152 Type 2 diabetes mellitus with diabetic peripheral angiopathy with gangrene: Secondary | ICD-10-CM | POA: Diagnosis not present

## 2019-06-20 DIAGNOSIS — L97416 Non-pressure chronic ulcer of right heel and midfoot with bone involvement without evidence of necrosis: Secondary | ICD-10-CM | POA: Diagnosis not present

## 2019-06-20 DIAGNOSIS — M86171 Other acute osteomyelitis, right ankle and foot: Secondary | ICD-10-CM | POA: Diagnosis not present

## 2019-06-20 DIAGNOSIS — E11621 Type 2 diabetes mellitus with foot ulcer: Secondary | ICD-10-CM | POA: Diagnosis not present

## 2019-06-20 DIAGNOSIS — L97426 Non-pressure chronic ulcer of left heel and midfoot with bone involvement without evidence of necrosis: Secondary | ICD-10-CM | POA: Diagnosis not present

## 2019-06-23 DIAGNOSIS — E889 Metabolic disorder, unspecified: Secondary | ICD-10-CM | POA: Diagnosis not present

## 2019-06-23 DIAGNOSIS — E1169 Type 2 diabetes mellitus with other specified complication: Secondary | ICD-10-CM | POA: Diagnosis not present

## 2019-06-23 DIAGNOSIS — K5900 Constipation, unspecified: Secondary | ICD-10-CM | POA: Diagnosis not present

## 2019-06-23 DIAGNOSIS — L97416 Non-pressure chronic ulcer of right heel and midfoot with bone involvement without evidence of necrosis: Secondary | ICD-10-CM | POA: Diagnosis not present

## 2019-06-23 DIAGNOSIS — E11621 Type 2 diabetes mellitus with foot ulcer: Secondary | ICD-10-CM | POA: Diagnosis not present

## 2019-06-23 DIAGNOSIS — M86171 Other acute osteomyelitis, right ankle and foot: Secondary | ICD-10-CM | POA: Diagnosis not present

## 2019-06-23 DIAGNOSIS — E1152 Type 2 diabetes mellitus with diabetic peripheral angiopathy with gangrene: Secondary | ICD-10-CM | POA: Diagnosis not present

## 2019-06-23 DIAGNOSIS — M797 Fibromyalgia: Secondary | ICD-10-CM | POA: Diagnosis not present

## 2019-06-23 DIAGNOSIS — L97426 Non-pressure chronic ulcer of left heel and midfoot with bone involvement without evidence of necrosis: Secondary | ICD-10-CM | POA: Diagnosis not present

## 2019-06-24 DIAGNOSIS — I739 Peripheral vascular disease, unspecified: Secondary | ICD-10-CM | POA: Diagnosis not present

## 2019-06-24 DIAGNOSIS — D649 Anemia, unspecified: Secondary | ICD-10-CM | POA: Diagnosis not present

## 2019-06-24 DIAGNOSIS — L8990 Pressure ulcer of unspecified site, unspecified stage: Secondary | ICD-10-CM | POA: Diagnosis not present

## 2019-06-24 DIAGNOSIS — M86279 Subacute osteomyelitis, unspecified ankle and foot: Secondary | ICD-10-CM | POA: Diagnosis not present

## 2019-06-25 DIAGNOSIS — E1169 Type 2 diabetes mellitus with other specified complication: Secondary | ICD-10-CM | POA: Diagnosis not present

## 2019-06-25 DIAGNOSIS — L97426 Non-pressure chronic ulcer of left heel and midfoot with bone involvement without evidence of necrosis: Secondary | ICD-10-CM | POA: Diagnosis not present

## 2019-06-25 DIAGNOSIS — M86171 Other acute osteomyelitis, right ankle and foot: Secondary | ICD-10-CM | POA: Diagnosis not present

## 2019-06-25 DIAGNOSIS — L97416 Non-pressure chronic ulcer of right heel and midfoot with bone involvement without evidence of necrosis: Secondary | ICD-10-CM | POA: Diagnosis not present

## 2019-06-25 DIAGNOSIS — E11621 Type 2 diabetes mellitus with foot ulcer: Secondary | ICD-10-CM | POA: Diagnosis not present

## 2019-06-25 DIAGNOSIS — E1152 Type 2 diabetes mellitus with diabetic peripheral angiopathy with gangrene: Secondary | ICD-10-CM | POA: Diagnosis not present

## 2019-06-27 DIAGNOSIS — M86171 Other acute osteomyelitis, right ankle and foot: Secondary | ICD-10-CM | POA: Diagnosis not present

## 2019-06-27 DIAGNOSIS — E1152 Type 2 diabetes mellitus with diabetic peripheral angiopathy with gangrene: Secondary | ICD-10-CM | POA: Diagnosis not present

## 2019-06-27 DIAGNOSIS — L97426 Non-pressure chronic ulcer of left heel and midfoot with bone involvement without evidence of necrosis: Secondary | ICD-10-CM | POA: Diagnosis not present

## 2019-06-27 DIAGNOSIS — E1169 Type 2 diabetes mellitus with other specified complication: Secondary | ICD-10-CM | POA: Diagnosis not present

## 2019-06-27 DIAGNOSIS — L97416 Non-pressure chronic ulcer of right heel and midfoot with bone involvement without evidence of necrosis: Secondary | ICD-10-CM | POA: Diagnosis not present

## 2019-06-27 DIAGNOSIS — E11621 Type 2 diabetes mellitus with foot ulcer: Secondary | ICD-10-CM | POA: Diagnosis not present

## 2019-06-30 DIAGNOSIS — E1152 Type 2 diabetes mellitus with diabetic peripheral angiopathy with gangrene: Secondary | ICD-10-CM | POA: Diagnosis not present

## 2019-06-30 DIAGNOSIS — L97426 Non-pressure chronic ulcer of left heel and midfoot with bone involvement without evidence of necrosis: Secondary | ICD-10-CM | POA: Diagnosis not present

## 2019-06-30 DIAGNOSIS — M86171 Other acute osteomyelitis, right ankle and foot: Secondary | ICD-10-CM | POA: Diagnosis not present

## 2019-06-30 DIAGNOSIS — L97416 Non-pressure chronic ulcer of right heel and midfoot with bone involvement without evidence of necrosis: Secondary | ICD-10-CM | POA: Diagnosis not present

## 2019-06-30 DIAGNOSIS — E1169 Type 2 diabetes mellitus with other specified complication: Secondary | ICD-10-CM | POA: Diagnosis not present

## 2019-06-30 DIAGNOSIS — E11621 Type 2 diabetes mellitus with foot ulcer: Secondary | ICD-10-CM | POA: Diagnosis not present

## 2019-07-02 DIAGNOSIS — L89619 Pressure ulcer of right heel, unspecified stage: Secondary | ICD-10-CM | POA: Diagnosis not present

## 2019-07-04 DIAGNOSIS — E11621 Type 2 diabetes mellitus with foot ulcer: Secondary | ICD-10-CM | POA: Diagnosis not present

## 2019-07-04 DIAGNOSIS — L97416 Non-pressure chronic ulcer of right heel and midfoot with bone involvement without evidence of necrosis: Secondary | ICD-10-CM | POA: Diagnosis not present

## 2019-07-04 DIAGNOSIS — L97426 Non-pressure chronic ulcer of left heel and midfoot with bone involvement without evidence of necrosis: Secondary | ICD-10-CM | POA: Diagnosis not present

## 2019-07-04 DIAGNOSIS — E1152 Type 2 diabetes mellitus with diabetic peripheral angiopathy with gangrene: Secondary | ICD-10-CM | POA: Diagnosis not present

## 2019-07-04 DIAGNOSIS — M86171 Other acute osteomyelitis, right ankle and foot: Secondary | ICD-10-CM | POA: Diagnosis not present

## 2019-07-04 DIAGNOSIS — E1169 Type 2 diabetes mellitus with other specified complication: Secondary | ICD-10-CM | POA: Diagnosis not present

## 2019-07-07 DIAGNOSIS — M86171 Other acute osteomyelitis, right ankle and foot: Secondary | ICD-10-CM | POA: Diagnosis not present

## 2019-07-07 DIAGNOSIS — E1152 Type 2 diabetes mellitus with diabetic peripheral angiopathy with gangrene: Secondary | ICD-10-CM | POA: Diagnosis not present

## 2019-07-07 DIAGNOSIS — L97426 Non-pressure chronic ulcer of left heel and midfoot with bone involvement without evidence of necrosis: Secondary | ICD-10-CM | POA: Diagnosis not present

## 2019-07-07 DIAGNOSIS — L97416 Non-pressure chronic ulcer of right heel and midfoot with bone involvement without evidence of necrosis: Secondary | ICD-10-CM | POA: Diagnosis not present

## 2019-07-07 DIAGNOSIS — E1169 Type 2 diabetes mellitus with other specified complication: Secondary | ICD-10-CM | POA: Diagnosis not present

## 2019-07-07 DIAGNOSIS — E11621 Type 2 diabetes mellitus with foot ulcer: Secondary | ICD-10-CM | POA: Diagnosis not present

## 2019-07-08 DIAGNOSIS — B9562 Methicillin resistant Staphylococcus aureus infection as the cause of diseases classified elsewhere: Secondary | ICD-10-CM | POA: Diagnosis not present

## 2019-07-08 DIAGNOSIS — B965 Pseudomonas (aeruginosa) (mallei) (pseudomallei) as the cause of diseases classified elsewhere: Secondary | ICD-10-CM | POA: Diagnosis not present

## 2019-07-08 DIAGNOSIS — F17210 Nicotine dependence, cigarettes, uncomplicated: Secondary | ICD-10-CM | POA: Diagnosis not present

## 2019-07-08 DIAGNOSIS — L97416 Non-pressure chronic ulcer of right heel and midfoot with bone involvement without evidence of necrosis: Secondary | ICD-10-CM | POA: Diagnosis not present

## 2019-07-08 DIAGNOSIS — E1152 Type 2 diabetes mellitus with diabetic peripheral angiopathy with gangrene: Secondary | ICD-10-CM | POA: Diagnosis not present

## 2019-07-08 DIAGNOSIS — I2510 Atherosclerotic heart disease of native coronary artery without angina pectoris: Secondary | ICD-10-CM | POA: Diagnosis not present

## 2019-07-08 DIAGNOSIS — N319 Neuromuscular dysfunction of bladder, unspecified: Secondary | ICD-10-CM | POA: Diagnosis not present

## 2019-07-08 DIAGNOSIS — E1169 Type 2 diabetes mellitus with other specified complication: Secondary | ICD-10-CM | POA: Diagnosis not present

## 2019-07-08 DIAGNOSIS — M797 Fibromyalgia: Secondary | ICD-10-CM | POA: Diagnosis not present

## 2019-07-08 DIAGNOSIS — K5900 Constipation, unspecified: Secondary | ICD-10-CM | POA: Diagnosis not present

## 2019-07-08 DIAGNOSIS — L97426 Non-pressure chronic ulcer of left heel and midfoot with bone involvement without evidence of necrosis: Secondary | ICD-10-CM | POA: Diagnosis not present

## 2019-07-08 DIAGNOSIS — Z4801 Encounter for change or removal of surgical wound dressing: Secondary | ICD-10-CM | POA: Diagnosis not present

## 2019-07-08 DIAGNOSIS — E11621 Type 2 diabetes mellitus with foot ulcer: Secondary | ICD-10-CM | POA: Diagnosis not present

## 2019-07-08 DIAGNOSIS — M86172 Other acute osteomyelitis, left ankle and foot: Secondary | ICD-10-CM | POA: Diagnosis not present

## 2019-07-08 DIAGNOSIS — G4700 Insomnia, unspecified: Secondary | ICD-10-CM | POA: Diagnosis not present

## 2019-07-08 DIAGNOSIS — M545 Low back pain: Secondary | ICD-10-CM | POA: Diagnosis not present

## 2019-07-08 DIAGNOSIS — I252 Old myocardial infarction: Secondary | ICD-10-CM | POA: Diagnosis not present

## 2019-07-08 DIAGNOSIS — N179 Acute kidney failure, unspecified: Secondary | ICD-10-CM | POA: Diagnosis not present

## 2019-07-08 DIAGNOSIS — R32 Unspecified urinary incontinence: Secondary | ICD-10-CM | POA: Diagnosis not present

## 2019-07-08 DIAGNOSIS — M86171 Other acute osteomyelitis, right ankle and foot: Secondary | ICD-10-CM | POA: Diagnosis not present

## 2019-07-08 DIAGNOSIS — D649 Anemia, unspecified: Secondary | ICD-10-CM | POA: Diagnosis not present

## 2019-07-08 DIAGNOSIS — Z4781 Encounter for orthopedic aftercare following surgical amputation: Secondary | ICD-10-CM | POA: Diagnosis not present

## 2019-07-08 DIAGNOSIS — R3914 Feeling of incomplete bladder emptying: Secondary | ICD-10-CM | POA: Diagnosis not present

## 2019-07-08 DIAGNOSIS — I5020 Unspecified systolic (congestive) heart failure: Secondary | ICD-10-CM | POA: Diagnosis not present

## 2019-07-08 DIAGNOSIS — I110 Hypertensive heart disease with heart failure: Secondary | ICD-10-CM | POA: Diagnosis not present

## 2019-07-09 DIAGNOSIS — E1152 Type 2 diabetes mellitus with diabetic peripheral angiopathy with gangrene: Secondary | ICD-10-CM | POA: Diagnosis not present

## 2019-07-09 DIAGNOSIS — L97416 Non-pressure chronic ulcer of right heel and midfoot with bone involvement without evidence of necrosis: Secondary | ICD-10-CM | POA: Diagnosis not present

## 2019-07-09 DIAGNOSIS — L97426 Non-pressure chronic ulcer of left heel and midfoot with bone involvement without evidence of necrosis: Secondary | ICD-10-CM | POA: Diagnosis not present

## 2019-07-09 DIAGNOSIS — E1169 Type 2 diabetes mellitus with other specified complication: Secondary | ICD-10-CM | POA: Diagnosis not present

## 2019-07-09 DIAGNOSIS — M86171 Other acute osteomyelitis, right ankle and foot: Secondary | ICD-10-CM | POA: Diagnosis not present

## 2019-07-09 DIAGNOSIS — E11621 Type 2 diabetes mellitus with foot ulcer: Secondary | ICD-10-CM | POA: Diagnosis not present

## 2019-07-11 DIAGNOSIS — E1152 Type 2 diabetes mellitus with diabetic peripheral angiopathy with gangrene: Secondary | ICD-10-CM | POA: Diagnosis not present

## 2019-07-11 DIAGNOSIS — M86171 Other acute osteomyelitis, right ankle and foot: Secondary | ICD-10-CM | POA: Diagnosis not present

## 2019-07-11 DIAGNOSIS — E1169 Type 2 diabetes mellitus with other specified complication: Secondary | ICD-10-CM | POA: Diagnosis not present

## 2019-07-11 DIAGNOSIS — L97426 Non-pressure chronic ulcer of left heel and midfoot with bone involvement without evidence of necrosis: Secondary | ICD-10-CM | POA: Diagnosis not present

## 2019-07-11 DIAGNOSIS — E11621 Type 2 diabetes mellitus with foot ulcer: Secondary | ICD-10-CM | POA: Diagnosis not present

## 2019-07-11 DIAGNOSIS — L97416 Non-pressure chronic ulcer of right heel and midfoot with bone involvement without evidence of necrosis: Secondary | ICD-10-CM | POA: Diagnosis not present

## 2019-07-14 DIAGNOSIS — M86171 Other acute osteomyelitis, right ankle and foot: Secondary | ICD-10-CM | POA: Diagnosis not present

## 2019-07-14 DIAGNOSIS — L97416 Non-pressure chronic ulcer of right heel and midfoot with bone involvement without evidence of necrosis: Secondary | ICD-10-CM | POA: Diagnosis not present

## 2019-07-14 DIAGNOSIS — E1152 Type 2 diabetes mellitus with diabetic peripheral angiopathy with gangrene: Secondary | ICD-10-CM | POA: Diagnosis not present

## 2019-07-14 DIAGNOSIS — L97426 Non-pressure chronic ulcer of left heel and midfoot with bone involvement without evidence of necrosis: Secondary | ICD-10-CM | POA: Diagnosis not present

## 2019-07-14 DIAGNOSIS — E1169 Type 2 diabetes mellitus with other specified complication: Secondary | ICD-10-CM | POA: Diagnosis not present

## 2019-07-14 DIAGNOSIS — E11621 Type 2 diabetes mellitus with foot ulcer: Secondary | ICD-10-CM | POA: Diagnosis not present

## 2019-07-16 DIAGNOSIS — M86171 Other acute osteomyelitis, right ankle and foot: Secondary | ICD-10-CM | POA: Diagnosis not present

## 2019-07-16 DIAGNOSIS — E1152 Type 2 diabetes mellitus with diabetic peripheral angiopathy with gangrene: Secondary | ICD-10-CM | POA: Diagnosis not present

## 2019-07-16 DIAGNOSIS — L97426 Non-pressure chronic ulcer of left heel and midfoot with bone involvement without evidence of necrosis: Secondary | ICD-10-CM | POA: Diagnosis not present

## 2019-07-16 DIAGNOSIS — E11621 Type 2 diabetes mellitus with foot ulcer: Secondary | ICD-10-CM | POA: Diagnosis not present

## 2019-07-16 DIAGNOSIS — L97416 Non-pressure chronic ulcer of right heel and midfoot with bone involvement without evidence of necrosis: Secondary | ICD-10-CM | POA: Diagnosis not present

## 2019-07-16 DIAGNOSIS — E1169 Type 2 diabetes mellitus with other specified complication: Secondary | ICD-10-CM | POA: Diagnosis not present

## 2019-07-18 DIAGNOSIS — E11621 Type 2 diabetes mellitus with foot ulcer: Secondary | ICD-10-CM | POA: Diagnosis not present

## 2019-07-18 DIAGNOSIS — M86171 Other acute osteomyelitis, right ankle and foot: Secondary | ICD-10-CM | POA: Diagnosis not present

## 2019-07-18 DIAGNOSIS — E1169 Type 2 diabetes mellitus with other specified complication: Secondary | ICD-10-CM | POA: Diagnosis not present

## 2019-07-18 DIAGNOSIS — L97416 Non-pressure chronic ulcer of right heel and midfoot with bone involvement without evidence of necrosis: Secondary | ICD-10-CM | POA: Diagnosis not present

## 2019-07-18 DIAGNOSIS — E1152 Type 2 diabetes mellitus with diabetic peripheral angiopathy with gangrene: Secondary | ICD-10-CM | POA: Diagnosis not present

## 2019-07-18 DIAGNOSIS — L97426 Non-pressure chronic ulcer of left heel and midfoot with bone involvement without evidence of necrosis: Secondary | ICD-10-CM | POA: Diagnosis not present

## 2019-07-21 DIAGNOSIS — L97426 Non-pressure chronic ulcer of left heel and midfoot with bone involvement without evidence of necrosis: Secondary | ICD-10-CM | POA: Diagnosis not present

## 2019-07-21 DIAGNOSIS — E1152 Type 2 diabetes mellitus with diabetic peripheral angiopathy with gangrene: Secondary | ICD-10-CM | POA: Diagnosis not present

## 2019-07-21 DIAGNOSIS — E11621 Type 2 diabetes mellitus with foot ulcer: Secondary | ICD-10-CM | POA: Diagnosis not present

## 2019-07-21 DIAGNOSIS — L97416 Non-pressure chronic ulcer of right heel and midfoot with bone involvement without evidence of necrosis: Secondary | ICD-10-CM | POA: Diagnosis not present

## 2019-07-21 DIAGNOSIS — M86171 Other acute osteomyelitis, right ankle and foot: Secondary | ICD-10-CM | POA: Diagnosis not present

## 2019-07-21 DIAGNOSIS — E1169 Type 2 diabetes mellitus with other specified complication: Secondary | ICD-10-CM | POA: Diagnosis not present

## 2019-07-23 DIAGNOSIS — E1169 Type 2 diabetes mellitus with other specified complication: Secondary | ICD-10-CM | POA: Diagnosis not present

## 2019-07-23 DIAGNOSIS — E11621 Type 2 diabetes mellitus with foot ulcer: Secondary | ICD-10-CM | POA: Diagnosis not present

## 2019-07-23 DIAGNOSIS — E1152 Type 2 diabetes mellitus with diabetic peripheral angiopathy with gangrene: Secondary | ICD-10-CM | POA: Diagnosis not present

## 2019-07-23 DIAGNOSIS — L97426 Non-pressure chronic ulcer of left heel and midfoot with bone involvement without evidence of necrosis: Secondary | ICD-10-CM | POA: Diagnosis not present

## 2019-07-23 DIAGNOSIS — M86171 Other acute osteomyelitis, right ankle and foot: Secondary | ICD-10-CM | POA: Diagnosis not present

## 2019-07-23 DIAGNOSIS — L97416 Non-pressure chronic ulcer of right heel and midfoot with bone involvement without evidence of necrosis: Secondary | ICD-10-CM | POA: Diagnosis not present

## 2019-07-25 DIAGNOSIS — L97416 Non-pressure chronic ulcer of right heel and midfoot with bone involvement without evidence of necrosis: Secondary | ICD-10-CM | POA: Diagnosis not present

## 2019-07-25 DIAGNOSIS — E1169 Type 2 diabetes mellitus with other specified complication: Secondary | ICD-10-CM | POA: Diagnosis not present

## 2019-07-25 DIAGNOSIS — E1152 Type 2 diabetes mellitus with diabetic peripheral angiopathy with gangrene: Secondary | ICD-10-CM | POA: Diagnosis not present

## 2019-07-25 DIAGNOSIS — E11621 Type 2 diabetes mellitus with foot ulcer: Secondary | ICD-10-CM | POA: Diagnosis not present

## 2019-07-25 DIAGNOSIS — M86171 Other acute osteomyelitis, right ankle and foot: Secondary | ICD-10-CM | POA: Diagnosis not present

## 2019-07-25 DIAGNOSIS — L97426 Non-pressure chronic ulcer of left heel and midfoot with bone involvement without evidence of necrosis: Secondary | ICD-10-CM | POA: Diagnosis not present

## 2019-07-29 DIAGNOSIS — E1152 Type 2 diabetes mellitus with diabetic peripheral angiopathy with gangrene: Secondary | ICD-10-CM | POA: Diagnosis not present

## 2019-07-29 DIAGNOSIS — E11621 Type 2 diabetes mellitus with foot ulcer: Secondary | ICD-10-CM | POA: Diagnosis not present

## 2019-07-29 DIAGNOSIS — E1169 Type 2 diabetes mellitus with other specified complication: Secondary | ICD-10-CM | POA: Diagnosis not present

## 2019-07-29 DIAGNOSIS — L97416 Non-pressure chronic ulcer of right heel and midfoot with bone involvement without evidence of necrosis: Secondary | ICD-10-CM | POA: Diagnosis not present

## 2019-07-29 DIAGNOSIS — L97426 Non-pressure chronic ulcer of left heel and midfoot with bone involvement without evidence of necrosis: Secondary | ICD-10-CM | POA: Diagnosis not present

## 2019-07-29 DIAGNOSIS — M86171 Other acute osteomyelitis, right ankle and foot: Secondary | ICD-10-CM | POA: Diagnosis not present

## 2019-07-31 DIAGNOSIS — I5022 Chronic systolic (congestive) heart failure: Secondary | ICD-10-CM | POA: Diagnosis not present

## 2019-07-31 DIAGNOSIS — Z01810 Encounter for preprocedural cardiovascular examination: Secondary | ICD-10-CM | POA: Diagnosis not present

## 2019-07-31 DIAGNOSIS — I2510 Atherosclerotic heart disease of native coronary artery without angina pectoris: Secondary | ICD-10-CM | POA: Diagnosis not present

## 2019-08-01 DIAGNOSIS — E1152 Type 2 diabetes mellitus with diabetic peripheral angiopathy with gangrene: Secondary | ICD-10-CM | POA: Diagnosis not present

## 2019-08-01 DIAGNOSIS — L97416 Non-pressure chronic ulcer of right heel and midfoot with bone involvement without evidence of necrosis: Secondary | ICD-10-CM | POA: Diagnosis not present

## 2019-08-01 DIAGNOSIS — L97426 Non-pressure chronic ulcer of left heel and midfoot with bone involvement without evidence of necrosis: Secondary | ICD-10-CM | POA: Diagnosis not present

## 2019-08-01 DIAGNOSIS — E1169 Type 2 diabetes mellitus with other specified complication: Secondary | ICD-10-CM | POA: Diagnosis not present

## 2019-08-01 DIAGNOSIS — M86171 Other acute osteomyelitis, right ankle and foot: Secondary | ICD-10-CM | POA: Diagnosis not present

## 2019-08-01 DIAGNOSIS — E11621 Type 2 diabetes mellitus with foot ulcer: Secondary | ICD-10-CM | POA: Diagnosis not present

## 2019-08-04 DIAGNOSIS — E1152 Type 2 diabetes mellitus with diabetic peripheral angiopathy with gangrene: Secondary | ICD-10-CM | POA: Diagnosis not present

## 2019-08-04 DIAGNOSIS — M86171 Other acute osteomyelitis, right ankle and foot: Secondary | ICD-10-CM | POA: Diagnosis not present

## 2019-08-04 DIAGNOSIS — E11621 Type 2 diabetes mellitus with foot ulcer: Secondary | ICD-10-CM | POA: Diagnosis not present

## 2019-08-04 DIAGNOSIS — L97416 Non-pressure chronic ulcer of right heel and midfoot with bone involvement without evidence of necrosis: Secondary | ICD-10-CM | POA: Diagnosis not present

## 2019-08-04 DIAGNOSIS — E1169 Type 2 diabetes mellitus with other specified complication: Secondary | ICD-10-CM | POA: Diagnosis not present

## 2019-08-04 DIAGNOSIS — L97426 Non-pressure chronic ulcer of left heel and midfoot with bone involvement without evidence of necrosis: Secondary | ICD-10-CM | POA: Diagnosis not present

## 2019-08-05 ENCOUNTER — Encounter: Payer: Self-pay | Admitting: Internal Medicine

## 2019-08-05 ENCOUNTER — Ambulatory Visit (INDEPENDENT_AMBULATORY_CARE_PROVIDER_SITE_OTHER): Payer: Medicare Other | Admitting: Internal Medicine

## 2019-08-05 ENCOUNTER — Other Ambulatory Visit: Payer: Self-pay

## 2019-08-05 VITALS — BP 126/61 | HR 72

## 2019-08-05 DIAGNOSIS — E1169 Type 2 diabetes mellitus with other specified complication: Secondary | ICD-10-CM | POA: Diagnosis not present

## 2019-08-05 DIAGNOSIS — M797 Fibromyalgia: Secondary | ICD-10-CM | POA: Diagnosis not present

## 2019-08-05 DIAGNOSIS — I739 Peripheral vascular disease, unspecified: Secondary | ICD-10-CM

## 2019-08-05 DIAGNOSIS — Z794 Long term (current) use of insulin: Secondary | ICD-10-CM

## 2019-08-05 DIAGNOSIS — J410 Simple chronic bronchitis: Secondary | ICD-10-CM | POA: Diagnosis not present

## 2019-08-05 DIAGNOSIS — E119 Type 2 diabetes mellitus without complications: Secondary | ICD-10-CM | POA: Insufficient documentation

## 2019-08-05 LAB — GLUCOSE, POCT (MANUAL RESULT ENTRY): POC Glucose: 106 mg/dl — AB (ref 70–99)

## 2019-08-05 MED ORDER — FUROSEMIDE 40 MG PO TABS: 40.0000 mg | ORAL_TABLET | Freq: Two times a day (BID) | ORAL | 0 refills | Status: AC

## 2019-08-05 MED ORDER — METFORMIN HCL ER 500 MG PO TB24: 1500.0000 mg | ORAL_TABLET | Freq: Every day | ORAL | 7 refills | Status: DC

## 2019-08-05 MED ORDER — OXYCODONE-ACETAMINOPHEN 7.5-325 MG PO TABS: 1.0000 | ORAL_TABLET | Freq: Every day | ORAL | 0 refills | Status: AC

## 2019-08-05 MED ORDER — ATORVASTATIN CALCIUM 80 MG PO TABS: 80.0000 mg | ORAL_TABLET | Freq: Every day | ORAL | 7 refills | Status: AC

## 2019-08-05 NOTE — Assessment & Plan Note (Signed)
Patient complaining of cramping of the both legs.   She also has a stent in  both legs.   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  She is going to see a plastic surgeon,who is considering amputation after evaluation.

## 2019-08-05 NOTE — Assessment & Plan Note (Signed)
Patient has quit smoking ?

## 2019-08-05 NOTE — Assessment & Plan Note (Addendum)
Blood sugar 105 today.

## 2019-08-05 NOTE — Progress Notes (Signed)
Patient ID: Colleen Peters, female   DOB: 12/04/1960, 59 y.o.   MRN: [id number]    Established Patient Office Visit  Subjective:  Patient ID: Colleen Peters, female    DOB: 04-Jan-1961  Age: 59 y.o. MRN: [id number]  CC:  Chief Complaint  Patient presents with  . Back Pain    patient is here for refill for oxycodone   . Diabetes    HPI  Colleen Peters presents for back pain and a refill of oxycodone. She saw a wound doctor in [place] recently and they decided to proceed with foot amputation because of gangrene in both feet and osteomyelitis of the left foot. She finds out this week if it will be one or both feet. She also has diabetes, for which she takes Humulin insulin. Her blood sugar was 105 today. She has stents in both legs and her heart. The patient denies chest pain. She has not been eating as much recently and has been bed bound due to her foot wounds. The patient quit smoking.  Past Medical History:  Diagnosis Date  . Diabetes mellitus without complication ([place])   . Fibromyalgia   . Hypertension   . Leg cramping   . Nerve pain   . Stroke [facility])     Past Surgical History:  Procedure Laterality Date  . ABDOMINAL HYSTERECTOMY    . APPENDECTOMY    . BACK SURGERY    . bone spur disease    . CHOLECYSTECTOMY    . CORONARY ANGIOGRAPHY N/A 01/31/2018   Procedure: CORONARY ANGIOGRAPHY;  Surgeon: Dionisio David, MD;  Location: [facility];  Service: Cardiovascular;  Laterality: N/A;  . CORONARY STENT INTERVENTION N/A 01/31/2018   Procedure: CORONARY STENT INTERVENTION;  Surgeon: Isaias Cowman, MD;  Location: [facility];  Service: Cardiovascular;  Laterality: N/A;  . LEFT HEART CATH Right 01/31/2018   Procedure: Left Heart Cath;  Surgeon: Dionisio David, MD;  Location: [facility];  Service: Cardiovascular;  Laterality: Right;  . lt elebow surgery  2008  . ovarian surgery for cyst    . Pacemaker to bladder    . rt great toe  surgery      History reviewed. No pertinent family history.  Social History   Socioeconomic History  . Marital status: Married    Spouse name: Not on file  . Number of children: Not on file  . Years of education: Not on file  . Highest education level: Not on file  Occupational History  . Not on file  Tobacco Use  . Smoking status: Current Every Day Smoker    Packs/day: 1.00    Types: Cigarettes  . Smokeless tobacco: Never Used  Substance and Sexual Activity  . Alcohol use: No  . Drug use: No  . Sexual activity: Not on file  Other Topics Concern  . Not on file  Social History Narrative  . Not on file   Social Determinants of Health   Financial Resource Strain:   . Difficulty of Paying Living Expenses:   Food Insecurity:   . Worried About Charity fundraiser in the Last Year:   . Arboriculturist in the Last Year:   Transportation Needs:   . Film/video editor (Medical):   Marland Kitchen Lack of Transportation (Non-Medical):   Physical Activity:   . Days of Exercise per Week:   . Minutes of Exercise per Session:   Stress:   . Feeling of  Stress :   Social Connections:   . Frequency of Communication with Friends and Family:   . Frequency of Social Gatherings with Friends and Family:   . Attends Religious Services:   . Active Member of Clubs or Organizations:   . Attends Archivist Meetings:   Marland Kitchen Marital Status:   Intimate Partner Violence:   . Fear of Current or Ex-Partner:   . Emotionally Abused:   Marland Kitchen Physically Abused:   . Sexually Abused:      Current Outpatient Medications:  .  aspirin 81 MG chewable tablet, Chew 1 tablet (81 mg total) by mouth daily., Disp: , Rfl:  .  atorvastatin (LIPITOR) 80 MG tablet, Take 1 tablet (80 mg total) by mouth at bedtime., Disp: 30 tablet, Rfl: 7 .  carvedilol (COREG) 6.25 MG tablet, Take 1 tablet (6.25 mg total) by mouth 2 (two) times daily with a meal., Disp: 60 tablet, Rfl: 0 .  clopidogrel (PLAVIX) 75 MG tablet, Take 1  tablet (75 mg total) by mouth daily with breakfast., Disp: 30 tablet, Rfl: 0 .  ezetimibe (ZETIA) 10 MG tablet, Take 1 tablet (10 mg total) by mouth daily., Disp: 60 tablet, Rfl: 0 .  furosemide (LASIX) 40 MG tablet, Take 1 tablet (40 mg total) by mouth 2 (two) times daily., Disp: 30 tablet, Rfl: 0 .  HUMULIN 70/30 (70-30) 100 UNIT/ML injection, , Disp: , Rfl:  .  insulin aspart protamine- aspart (NOVOLOG MIX 70/30) (70-30) 100 UNIT/ML injection, Inject 0-50 Units into the skin 2 (two) times daily with a meal. Patient reports that she takes 70/30 BID based on glucose (if glucose good then she does not take any; if glucose <250 mg/d she takes 25 units if she is going to eat; if glucose >250 and she is going to eat she takes 50 units), Disp: , Rfl:  .  isosorbide mononitrate (IMDUR) 30 MG 24 hr tablet, Take 30 mg by mouth daily., Disp: , Rfl:  .  lisinopril (ZESTRIL) 20 MG tablet, Take 20 mg by mouth daily., Disp: , Rfl:  .  metFORMIN (GLUCOPHAGE-XR) 500 MG 24 hr tablet, Take 3 tablets (1,500 mg total) by mouth daily., Disp: 30 tablet, Rfl: 7 .  nitroGLYCERIN (NITROSTAT) 0.4 MG SL tablet, Place 0.4 mg under the tongue every 5 (five) minutes as needed for chest pain., Disp: , Rfl:  .  oxyCODONE-acetaminophen (PERCOCET) 7.5-325 MG tablet, Take 1 tablet by mouth daily., Disp: 30 tablet, Rfl: 0 .  triamcinolone cream (KENALOG) 0.1 %, Apply 1 application topically 2 (two) times daily., Disp: , Rfl:    Allergies  Allergen Reactions  . Relafen [Nabumetone]   . Sulfamethoxazole-Trimethoprim Nausea And Vomiting and Nausea Only    ROS Review of Systems  Constitutional: Positive for appetite change.  HENT: Negative.   Eyes: Negative.   Respiratory: Negative.   Cardiovascular: Negative.  Negative for chest pain.  Gastrointestinal: Negative.   Endocrine: Negative.   Genitourinary: Negative.   Musculoskeletal: Positive for back pain.  Skin: Positive for wound.  Allergic/Immunologic: Negative.     Neurological: Negative.   Hematological: Negative.   Psychiatric/Behavioral: Negative.       Objective:    Physical Exam  Constitutional: The patient is oriented to person, place, and time. Pt appears emaciated.  Head: Normocephalic and atraumatic.  Eyes: Pupils are equal, round, and reactive to light.  Neck: No JVD present. No tracheal deviation present. No thyromegaly present.  Cardiovascular: Regular rate and rhythm. No gallop. Pulmonary/Chest: Normal breath  sounds. Lungs clear to auscultation. Abdominal: No abdominal tenderness. No guarding or rebound tenderness. No hepatosplenomegaly. Musculoskeletal: Normal range of motion.  Lymphatic: No cervical adenopathy.  Neurological: No cranial nerve deficit.  Skin: Skin is warm and hydrated. Pt has ulcers on both feet and bandages were not removed. Psychiatric: The patient has a normal mood and affect.  BP 126/61   Pulse 72  Wt Readings from Last 3 Encounters:  02/01/18 163 lb 1.6 oz (74 kg)  09/20/16 158 lb (71.7 kg)  04/12/16 163 lb (73.9 kg)     Health Maintenance Due  Topic Date Due  . Hepatitis C Screening  Never done  . PNEUMOCOCCAL POLYSACCHARIDE VACCINE AGE 36-64 HIGH RISK  Never done  . FOOT EXAM  Never done  . OPHTHALMOLOGY EXAM  Never done  . COVID-19 Vaccine (1) Never done  . TETANUS/TDAP  Never done  . PAP SMEAR-Modifier  Never done  . MAMMOGRAM  Never done  . COLONOSCOPY  Never done  . HEMOGLOBIN A1C  07/31/2018    There are no preventive care reminders to display for this patient.  Lab Results  Component Value Date   TSH 2.745 01/29/2018   Lab Results  Component Value Date   WBC 10.7 (H) 02/01/2018   HGB 10.8 (L) 02/01/2018   HCT 33.8 (L) 02/01/2018   MCV 96.3 02/01/2018   PLT 266 02/01/2018   Lab Results  Component Value Date   NA 141 02/01/2018   K 4.2 02/01/2018   CO2 37 (H) 02/01/2018   GLUCOSE 190 (H) 02/01/2018   BUN 39 (H) 02/01/2018   CREATININE 1.50 (H) 02/01/2018   BILITOT 0.5  01/29/2018   ALKPHOS 194 (H) 01/29/2018   AST 51 (H) 01/29/2018   ALT 74 (H) 01/29/2018   PROT 6.4 (L) 01/29/2018   ALBUMIN 2.8 (L) 01/29/2018   CALCIUM 8.7 (L) 02/01/2018   ANIONGAP 4 (L) 02/01/2018   Lab Results  Component Value Date   CHOL 286 (H) 01/29/2018   Lab Results  Component Value Date   HDL 35 (L) 01/29/2018   Lab Results  Component Value Date   LDLCALC 216 (H) 01/29/2018   Lab Results  Component Value Date   TRIG 173 (H) 01/29/2018   Lab Results  Component Value Date   CHOLHDL 8.2 01/29/2018   Lab Results  Component Value Date   HGBA1C 9.7 (H) 01/29/2018      Assessment & Plan:   Problem List Items Addressed This Visit      Respiratory   Simple chronic bronchitis ([place])    Patient has quit smoking.        Endocrine   Diabetes mellitus ([place])    Blood sugar 105 today.      Relevant Medications   lisinopril (ZESTRIL) 20 MG tablet   atorvastatin (LIPITOR) 80 MG tablet   metFORMIN (GLUCOPHAGE-XR) 500 MG 24 hr tablet   Other Relevant Orders   POCT glucose (manual entry) (Completed)     Other   Fibromyalgia - Primary     chronic problem.      Relevant Medications   oxyCODONE-acetaminophen (PERCOCET) 7.5-325 MG tablet   Claudication in peripheral vascular disease [facility])    Patient complaining of cramping of the both legs.   She also has a stent in  both legs.   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  She is going to see a plastic  surgeon,who is considering amputation after evaluation.      Relevant Medications   oxyCODONE-acetaminophen (PERCOCET) 7.5-325 MG tablet      Meds ordered this encounter  Medications  . atorvastatin (LIPITOR) 80 MG tablet    Sig: Take 1 tablet (80 mg total) by mouth at bedtime.    Dispense:  30 tablet    Refill:  7  . furosemide (LASIX) 40 MG tablet    Sig: Take 1 tablet (40 mg total) by mouth 2 (two) times daily.    Dispense:  30 tablet     Refill:  0  . metFORMIN (GLUCOPHAGE-XR) 500 MG 24 hr tablet    Sig: Take 3 tablets (1,500 mg total) by mouth daily.    Dispense:  30 tablet    Refill:  7  . oxyCODONE-acetaminophen (PERCOCET) 7.5-325 MG tablet    Sig: Take 1 tablet by mouth daily.    Dispense:  30 tablet    Refill:  0   1. Type 2 diabetes mellitus with other specified complication, with long-term current use of insulin ([facility]) - POCT glucose (105)  2. Fibromyalgia Chronic problem patient pain medication was refilled. - oxyCODONE-acetaminophen (PERCOCET) 7.5-325 MG tablet; Take 1 tablet by mouth daily.  Dispense: 30 tablet; Refill: 0  3. Claudication in peripheral vascular disease [facility]) Patient has a foot ulcer because of the peripheral vascular disease going to see the plastic surgeon for surgery.  4. Simple chronic bronchitis ([place]) Patient has quit smoking. Follow-up: Return in 4 weeks (on 09/02/2019) for Pt will call to schedule next appointment.   By signing my name below, I, De Burrs, attest that this documentation has been prepared under the direction and in the presence of Cletis Athens, MD. Electronically Signed: De Burrs, Medical Scribe. 08/05/19. 9:33 AM.  I personally performed the services described in this documentation, which was SCRIBED in my presence. The recorded information has been reviewed and considered accurate. It has been edited as necessary during review. Cletis Athens, MD

## 2019-08-05 NOTE — Assessment & Plan Note (Signed)
chronic problem.

## 2019-08-06 DIAGNOSIS — M86171 Other acute osteomyelitis, right ankle and foot: Secondary | ICD-10-CM | POA: Diagnosis not present

## 2019-08-06 DIAGNOSIS — L97416 Non-pressure chronic ulcer of right heel and midfoot with bone involvement without evidence of necrosis: Secondary | ICD-10-CM | POA: Diagnosis not present

## 2019-08-06 DIAGNOSIS — E1169 Type 2 diabetes mellitus with other specified complication: Secondary | ICD-10-CM | POA: Diagnosis not present

## 2019-08-06 DIAGNOSIS — L97426 Non-pressure chronic ulcer of left heel and midfoot with bone involvement without evidence of necrosis: Secondary | ICD-10-CM | POA: Diagnosis not present

## 2019-08-06 DIAGNOSIS — E11621 Type 2 diabetes mellitus with foot ulcer: Secondary | ICD-10-CM | POA: Diagnosis not present

## 2019-08-06 DIAGNOSIS — E1152 Type 2 diabetes mellitus with diabetic peripheral angiopathy with gangrene: Secondary | ICD-10-CM | POA: Diagnosis not present

## 2019-08-07 DIAGNOSIS — S91302A Unspecified open wound, left foot, initial encounter: Secondary | ICD-10-CM | POA: Diagnosis not present

## 2019-08-07 DIAGNOSIS — R3914 Feeling of incomplete bladder emptying: Secondary | ICD-10-CM | POA: Diagnosis not present

## 2019-08-07 DIAGNOSIS — M86171 Other acute osteomyelitis, right ankle and foot: Secondary | ICD-10-CM | POA: Diagnosis not present

## 2019-08-07 DIAGNOSIS — Z4801 Encounter for change or removal of surgical wound dressing: Secondary | ICD-10-CM | POA: Diagnosis not present

## 2019-08-07 DIAGNOSIS — I252 Old myocardial infarction: Secondary | ICD-10-CM | POA: Diagnosis not present

## 2019-08-07 DIAGNOSIS — I2510 Atherosclerotic heart disease of native coronary artery without angina pectoris: Secondary | ICD-10-CM | POA: Diagnosis not present

## 2019-08-07 DIAGNOSIS — B965 Pseudomonas (aeruginosa) (mallei) (pseudomallei) as the cause of diseases classified elsewhere: Secondary | ICD-10-CM | POA: Diagnosis not present

## 2019-08-07 DIAGNOSIS — I10 Essential (primary) hypertension: Secondary | ICD-10-CM | POA: Diagnosis not present

## 2019-08-07 DIAGNOSIS — I5020 Unspecified systolic (congestive) heart failure: Secondary | ICD-10-CM | POA: Diagnosis not present

## 2019-08-07 DIAGNOSIS — G4700 Insomnia, unspecified: Secondary | ICD-10-CM | POA: Diagnosis not present

## 2019-08-07 DIAGNOSIS — E1152 Type 2 diabetes mellitus with diabetic peripheral angiopathy with gangrene: Secondary | ICD-10-CM | POA: Diagnosis not present

## 2019-08-07 DIAGNOSIS — B9562 Methicillin resistant Staphylococcus aureus infection as the cause of diseases classified elsewhere: Secondary | ICD-10-CM | POA: Diagnosis not present

## 2019-08-07 DIAGNOSIS — L97419 Non-pressure chronic ulcer of right heel and midfoot with unspecified severity: Secondary | ICD-10-CM | POA: Diagnosis not present

## 2019-08-07 DIAGNOSIS — F17210 Nicotine dependence, cigarettes, uncomplicated: Secondary | ICD-10-CM | POA: Diagnosis not present

## 2019-08-07 DIAGNOSIS — Z466 Encounter for fitting and adjustment of urinary device: Secondary | ICD-10-CM | POA: Diagnosis not present

## 2019-08-07 DIAGNOSIS — L97511 Non-pressure chronic ulcer of other part of right foot limited to breakdown of skin: Secondary | ICD-10-CM | POA: Diagnosis not present

## 2019-08-07 DIAGNOSIS — I110 Hypertensive heart disease with heart failure: Secondary | ICD-10-CM | POA: Diagnosis not present

## 2019-08-07 DIAGNOSIS — Z794 Long term (current) use of insulin: Secondary | ICD-10-CM | POA: Diagnosis not present

## 2019-08-07 DIAGNOSIS — E11621 Type 2 diabetes mellitus with foot ulcer: Secondary | ICD-10-CM | POA: Diagnosis not present

## 2019-08-07 DIAGNOSIS — M797 Fibromyalgia: Secondary | ICD-10-CM | POA: Diagnosis not present

## 2019-08-07 DIAGNOSIS — R32 Unspecified urinary incontinence: Secondary | ICD-10-CM | POA: Diagnosis not present

## 2019-08-07 DIAGNOSIS — K5900 Constipation, unspecified: Secondary | ICD-10-CM | POA: Diagnosis not present

## 2019-08-07 DIAGNOSIS — Z9889 Other specified postprocedural states: Secondary | ICD-10-CM | POA: Diagnosis not present

## 2019-08-07 DIAGNOSIS — E1169 Type 2 diabetes mellitus with other specified complication: Secondary | ICD-10-CM | POA: Diagnosis not present

## 2019-08-07 DIAGNOSIS — E1151 Type 2 diabetes mellitus with diabetic peripheral angiopathy without gangrene: Secondary | ICD-10-CM | POA: Diagnosis not present

## 2019-08-07 DIAGNOSIS — I739 Peripheral vascular disease, unspecified: Secondary | ICD-10-CM | POA: Diagnosis not present

## 2019-08-07 DIAGNOSIS — D649 Anemia, unspecified: Secondary | ICD-10-CM | POA: Diagnosis not present

## 2019-08-07 DIAGNOSIS — M545 Low back pain: Secondary | ICD-10-CM | POA: Diagnosis not present

## 2019-08-07 DIAGNOSIS — Z4781 Encounter for orthopedic aftercare following surgical amputation: Secondary | ICD-10-CM | POA: Diagnosis not present

## 2019-08-07 DIAGNOSIS — N319 Neuromuscular dysfunction of bladder, unspecified: Secondary | ICD-10-CM | POA: Diagnosis not present

## 2019-08-08 DIAGNOSIS — E1169 Type 2 diabetes mellitus with other specified complication: Secondary | ICD-10-CM | POA: Diagnosis not present

## 2019-08-08 DIAGNOSIS — E1152 Type 2 diabetes mellitus with diabetic peripheral angiopathy with gangrene: Secondary | ICD-10-CM | POA: Diagnosis not present

## 2019-08-08 DIAGNOSIS — L97511 Non-pressure chronic ulcer of other part of right foot limited to breakdown of skin: Secondary | ICD-10-CM | POA: Diagnosis not present

## 2019-08-08 DIAGNOSIS — L97419 Non-pressure chronic ulcer of right heel and midfoot with unspecified severity: Secondary | ICD-10-CM | POA: Diagnosis not present

## 2019-08-08 DIAGNOSIS — Z4781 Encounter for orthopedic aftercare following surgical amputation: Secondary | ICD-10-CM | POA: Diagnosis not present

## 2019-08-08 DIAGNOSIS — E11621 Type 2 diabetes mellitus with foot ulcer: Secondary | ICD-10-CM | POA: Diagnosis not present

## 2019-08-11 DIAGNOSIS — E11621 Type 2 diabetes mellitus with foot ulcer: Secondary | ICD-10-CM | POA: Diagnosis not present

## 2019-08-11 DIAGNOSIS — L97511 Non-pressure chronic ulcer of other part of right foot limited to breakdown of skin: Secondary | ICD-10-CM | POA: Diagnosis not present

## 2019-08-11 DIAGNOSIS — L97419 Non-pressure chronic ulcer of right heel and midfoot with unspecified severity: Secondary | ICD-10-CM | POA: Diagnosis not present

## 2019-08-11 DIAGNOSIS — Z4781 Encounter for orthopedic aftercare following surgical amputation: Secondary | ICD-10-CM | POA: Diagnosis not present

## 2019-08-11 DIAGNOSIS — E1169 Type 2 diabetes mellitus with other specified complication: Secondary | ICD-10-CM | POA: Diagnosis not present

## 2019-08-11 DIAGNOSIS — E1152 Type 2 diabetes mellitus with diabetic peripheral angiopathy with gangrene: Secondary | ICD-10-CM | POA: Diagnosis not present

## 2019-08-13 DIAGNOSIS — E11621 Type 2 diabetes mellitus with foot ulcer: Secondary | ICD-10-CM | POA: Diagnosis not present

## 2019-08-13 DIAGNOSIS — L97511 Non-pressure chronic ulcer of other part of right foot limited to breakdown of skin: Secondary | ICD-10-CM | POA: Diagnosis not present

## 2019-08-13 DIAGNOSIS — E1169 Type 2 diabetes mellitus with other specified complication: Secondary | ICD-10-CM | POA: Diagnosis not present

## 2019-08-13 DIAGNOSIS — Z4781 Encounter for orthopedic aftercare following surgical amputation: Secondary | ICD-10-CM | POA: Diagnosis not present

## 2019-08-13 DIAGNOSIS — L97419 Non-pressure chronic ulcer of right heel and midfoot with unspecified severity: Secondary | ICD-10-CM | POA: Diagnosis not present

## 2019-08-13 DIAGNOSIS — E1152 Type 2 diabetes mellitus with diabetic peripheral angiopathy with gangrene: Secondary | ICD-10-CM | POA: Diagnosis not present

## 2019-08-15 DIAGNOSIS — L97419 Non-pressure chronic ulcer of right heel and midfoot with unspecified severity: Secondary | ICD-10-CM | POA: Diagnosis not present

## 2019-08-15 DIAGNOSIS — L97511 Non-pressure chronic ulcer of other part of right foot limited to breakdown of skin: Secondary | ICD-10-CM | POA: Diagnosis not present

## 2019-08-15 DIAGNOSIS — Z4781 Encounter for orthopedic aftercare following surgical amputation: Secondary | ICD-10-CM | POA: Diagnosis not present

## 2019-08-15 DIAGNOSIS — E1152 Type 2 diabetes mellitus with diabetic peripheral angiopathy with gangrene: Secondary | ICD-10-CM | POA: Diagnosis not present

## 2019-08-15 DIAGNOSIS — E11621 Type 2 diabetes mellitus with foot ulcer: Secondary | ICD-10-CM | POA: Diagnosis not present

## 2019-08-15 DIAGNOSIS — E1169 Type 2 diabetes mellitus with other specified complication: Secondary | ICD-10-CM | POA: Diagnosis not present

## 2019-08-18 DIAGNOSIS — E1152 Type 2 diabetes mellitus with diabetic peripheral angiopathy with gangrene: Secondary | ICD-10-CM | POA: Diagnosis not present

## 2019-08-18 DIAGNOSIS — L97419 Non-pressure chronic ulcer of right heel and midfoot with unspecified severity: Secondary | ICD-10-CM | POA: Diagnosis not present

## 2019-08-18 DIAGNOSIS — E1169 Type 2 diabetes mellitus with other specified complication: Secondary | ICD-10-CM | POA: Diagnosis not present

## 2019-08-18 DIAGNOSIS — Z4781 Encounter for orthopedic aftercare following surgical amputation: Secondary | ICD-10-CM | POA: Diagnosis not present

## 2019-08-18 DIAGNOSIS — L97511 Non-pressure chronic ulcer of other part of right foot limited to breakdown of skin: Secondary | ICD-10-CM | POA: Diagnosis not present

## 2019-08-18 DIAGNOSIS — E11621 Type 2 diabetes mellitus with foot ulcer: Secondary | ICD-10-CM | POA: Diagnosis not present

## 2019-08-20 DIAGNOSIS — E11621 Type 2 diabetes mellitus with foot ulcer: Secondary | ICD-10-CM | POA: Diagnosis not present

## 2019-08-20 DIAGNOSIS — L97419 Non-pressure chronic ulcer of right heel and midfoot with unspecified severity: Secondary | ICD-10-CM | POA: Diagnosis not present

## 2019-08-20 DIAGNOSIS — L97511 Non-pressure chronic ulcer of other part of right foot limited to breakdown of skin: Secondary | ICD-10-CM | POA: Diagnosis not present

## 2019-08-20 DIAGNOSIS — E1152 Type 2 diabetes mellitus with diabetic peripheral angiopathy with gangrene: Secondary | ICD-10-CM | POA: Diagnosis not present

## 2019-08-20 DIAGNOSIS — E1169 Type 2 diabetes mellitus with other specified complication: Secondary | ICD-10-CM | POA: Diagnosis not present

## 2019-08-20 DIAGNOSIS — Z4781 Encounter for orthopedic aftercare following surgical amputation: Secondary | ICD-10-CM | POA: Diagnosis not present

## 2019-08-22 DIAGNOSIS — E11621 Type 2 diabetes mellitus with foot ulcer: Secondary | ICD-10-CM | POA: Diagnosis not present

## 2019-08-22 DIAGNOSIS — Z4781 Encounter for orthopedic aftercare following surgical amputation: Secondary | ICD-10-CM | POA: Diagnosis not present

## 2019-08-22 DIAGNOSIS — E1152 Type 2 diabetes mellitus with diabetic peripheral angiopathy with gangrene: Secondary | ICD-10-CM | POA: Diagnosis not present

## 2019-08-22 DIAGNOSIS — L97511 Non-pressure chronic ulcer of other part of right foot limited to breakdown of skin: Secondary | ICD-10-CM | POA: Diagnosis not present

## 2019-08-22 DIAGNOSIS — E1169 Type 2 diabetes mellitus with other specified complication: Secondary | ICD-10-CM | POA: Diagnosis not present

## 2019-08-22 DIAGNOSIS — L97419 Non-pressure chronic ulcer of right heel and midfoot with unspecified severity: Secondary | ICD-10-CM | POA: Diagnosis not present

## 2019-08-25 DIAGNOSIS — E1152 Type 2 diabetes mellitus with diabetic peripheral angiopathy with gangrene: Secondary | ICD-10-CM | POA: Diagnosis not present

## 2019-08-25 DIAGNOSIS — L97419 Non-pressure chronic ulcer of right heel and midfoot with unspecified severity: Secondary | ICD-10-CM | POA: Diagnosis not present

## 2019-08-25 DIAGNOSIS — Z4781 Encounter for orthopedic aftercare following surgical amputation: Secondary | ICD-10-CM | POA: Diagnosis not present

## 2019-08-25 DIAGNOSIS — L97511 Non-pressure chronic ulcer of other part of right foot limited to breakdown of skin: Secondary | ICD-10-CM | POA: Diagnosis not present

## 2019-08-25 DIAGNOSIS — E1169 Type 2 diabetes mellitus with other specified complication: Secondary | ICD-10-CM | POA: Diagnosis not present

## 2019-08-25 DIAGNOSIS — E11621 Type 2 diabetes mellitus with foot ulcer: Secondary | ICD-10-CM | POA: Diagnosis not present

## 2019-08-27 DIAGNOSIS — L97829 Non-pressure chronic ulcer of other part of left lower leg with unspecified severity: Secondary | ICD-10-CM | POA: Diagnosis not present

## 2019-09-02 DIAGNOSIS — L97419 Non-pressure chronic ulcer of right heel and midfoot with unspecified severity: Secondary | ICD-10-CM | POA: Diagnosis not present

## 2019-09-02 DIAGNOSIS — Z4781 Encounter for orthopedic aftercare following surgical amputation: Secondary | ICD-10-CM | POA: Diagnosis not present

## 2019-09-02 DIAGNOSIS — E1169 Type 2 diabetes mellitus with other specified complication: Secondary | ICD-10-CM | POA: Diagnosis not present

## 2019-09-02 DIAGNOSIS — E11621 Type 2 diabetes mellitus with foot ulcer: Secondary | ICD-10-CM | POA: Diagnosis not present

## 2019-09-02 DIAGNOSIS — L97511 Non-pressure chronic ulcer of other part of right foot limited to breakdown of skin: Secondary | ICD-10-CM | POA: Diagnosis not present

## 2019-09-02 DIAGNOSIS — E1152 Type 2 diabetes mellitus with diabetic peripheral angiopathy with gangrene: Secondary | ICD-10-CM | POA: Diagnosis not present

## 2019-09-05 DIAGNOSIS — Z4781 Encounter for orthopedic aftercare following surgical amputation: Secondary | ICD-10-CM | POA: Diagnosis not present

## 2019-09-05 DIAGNOSIS — L97511 Non-pressure chronic ulcer of other part of right foot limited to breakdown of skin: Secondary | ICD-10-CM | POA: Diagnosis not present

## 2019-09-05 DIAGNOSIS — L97419 Non-pressure chronic ulcer of right heel and midfoot with unspecified severity: Secondary | ICD-10-CM | POA: Diagnosis not present

## 2019-09-05 DIAGNOSIS — E1169 Type 2 diabetes mellitus with other specified complication: Secondary | ICD-10-CM | POA: Diagnosis not present

## 2019-09-05 DIAGNOSIS — E1152 Type 2 diabetes mellitus with diabetic peripheral angiopathy with gangrene: Secondary | ICD-10-CM | POA: Diagnosis not present

## 2019-09-05 DIAGNOSIS — E11621 Type 2 diabetes mellitus with foot ulcer: Secondary | ICD-10-CM | POA: Diagnosis not present

## 2019-09-06 DIAGNOSIS — Z4801 Encounter for change or removal of surgical wound dressing: Secondary | ICD-10-CM | POA: Diagnosis not present

## 2019-09-06 DIAGNOSIS — Z466 Encounter for fitting and adjustment of urinary device: Secondary | ICD-10-CM | POA: Diagnosis not present

## 2019-09-06 DIAGNOSIS — I2510 Atherosclerotic heart disease of native coronary artery without angina pectoris: Secondary | ICD-10-CM | POA: Diagnosis not present

## 2019-09-06 DIAGNOSIS — F17210 Nicotine dependence, cigarettes, uncomplicated: Secondary | ICD-10-CM | POA: Diagnosis not present

## 2019-09-06 DIAGNOSIS — G4700 Insomnia, unspecified: Secondary | ICD-10-CM | POA: Diagnosis not present

## 2019-09-06 DIAGNOSIS — I110 Hypertensive heart disease with heart failure: Secondary | ICD-10-CM | POA: Diagnosis not present

## 2019-09-06 DIAGNOSIS — M86171 Other acute osteomyelitis, right ankle and foot: Secondary | ICD-10-CM | POA: Diagnosis not present

## 2019-09-06 DIAGNOSIS — E11621 Type 2 diabetes mellitus with foot ulcer: Secondary | ICD-10-CM | POA: Diagnosis not present

## 2019-09-06 DIAGNOSIS — M545 Low back pain: Secondary | ICD-10-CM | POA: Diagnosis not present

## 2019-09-06 DIAGNOSIS — L97419 Non-pressure chronic ulcer of right heel and midfoot with unspecified severity: Secondary | ICD-10-CM | POA: Diagnosis not present

## 2019-09-06 DIAGNOSIS — Z4781 Encounter for orthopedic aftercare following surgical amputation: Secondary | ICD-10-CM | POA: Diagnosis not present

## 2019-09-06 DIAGNOSIS — D649 Anemia, unspecified: Secondary | ICD-10-CM | POA: Diagnosis not present

## 2019-09-06 DIAGNOSIS — I252 Old myocardial infarction: Secondary | ICD-10-CM | POA: Diagnosis not present

## 2019-09-06 DIAGNOSIS — K5900 Constipation, unspecified: Secondary | ICD-10-CM | POA: Diagnosis not present

## 2019-09-06 DIAGNOSIS — N319 Neuromuscular dysfunction of bladder, unspecified: Secondary | ICD-10-CM | POA: Diagnosis not present

## 2019-09-06 DIAGNOSIS — B965 Pseudomonas (aeruginosa) (mallei) (pseudomallei) as the cause of diseases classified elsewhere: Secondary | ICD-10-CM | POA: Diagnosis not present

## 2019-09-06 DIAGNOSIS — E1152 Type 2 diabetes mellitus with diabetic peripheral angiopathy with gangrene: Secondary | ICD-10-CM | POA: Diagnosis not present

## 2019-09-06 DIAGNOSIS — M797 Fibromyalgia: Secondary | ICD-10-CM | POA: Diagnosis not present

## 2019-09-06 DIAGNOSIS — R3914 Feeling of incomplete bladder emptying: Secondary | ICD-10-CM | POA: Diagnosis not present

## 2019-09-06 DIAGNOSIS — I5020 Unspecified systolic (congestive) heart failure: Secondary | ICD-10-CM | POA: Diagnosis not present

## 2019-09-06 DIAGNOSIS — E1169 Type 2 diabetes mellitus with other specified complication: Secondary | ICD-10-CM | POA: Diagnosis not present

## 2019-09-06 DIAGNOSIS — L97511 Non-pressure chronic ulcer of other part of right foot limited to breakdown of skin: Secondary | ICD-10-CM | POA: Diagnosis not present

## 2019-09-06 DIAGNOSIS — Z794 Long term (current) use of insulin: Secondary | ICD-10-CM | POA: Diagnosis not present

## 2019-09-06 DIAGNOSIS — B9562 Methicillin resistant Staphylococcus aureus infection as the cause of diseases classified elsewhere: Secondary | ICD-10-CM | POA: Diagnosis not present

## 2019-09-06 DIAGNOSIS — R32 Unspecified urinary incontinence: Secondary | ICD-10-CM | POA: Diagnosis not present

## 2019-09-08 DIAGNOSIS — L97511 Non-pressure chronic ulcer of other part of right foot limited to breakdown of skin: Secondary | ICD-10-CM | POA: Diagnosis not present

## 2019-09-08 DIAGNOSIS — L97419 Non-pressure chronic ulcer of right heel and midfoot with unspecified severity: Secondary | ICD-10-CM | POA: Diagnosis not present

## 2019-09-08 DIAGNOSIS — Z4781 Encounter for orthopedic aftercare following surgical amputation: Secondary | ICD-10-CM | POA: Diagnosis not present

## 2019-09-08 DIAGNOSIS — E11621 Type 2 diabetes mellitus with foot ulcer: Secondary | ICD-10-CM | POA: Diagnosis not present

## 2019-09-08 DIAGNOSIS — E1152 Type 2 diabetes mellitus with diabetic peripheral angiopathy with gangrene: Secondary | ICD-10-CM | POA: Diagnosis not present

## 2019-09-08 DIAGNOSIS — E1169 Type 2 diabetes mellitus with other specified complication: Secondary | ICD-10-CM | POA: Diagnosis not present

## 2019-09-10 DIAGNOSIS — E1169 Type 2 diabetes mellitus with other specified complication: Secondary | ICD-10-CM | POA: Diagnosis not present

## 2019-09-10 DIAGNOSIS — Z4781 Encounter for orthopedic aftercare following surgical amputation: Secondary | ICD-10-CM | POA: Diagnosis not present

## 2019-09-10 DIAGNOSIS — E11621 Type 2 diabetes mellitus with foot ulcer: Secondary | ICD-10-CM | POA: Diagnosis not present

## 2019-09-10 DIAGNOSIS — L97419 Non-pressure chronic ulcer of right heel and midfoot with unspecified severity: Secondary | ICD-10-CM | POA: Diagnosis not present

## 2019-09-10 DIAGNOSIS — L97511 Non-pressure chronic ulcer of other part of right foot limited to breakdown of skin: Secondary | ICD-10-CM | POA: Diagnosis not present

## 2019-09-10 DIAGNOSIS — E1152 Type 2 diabetes mellitus with diabetic peripheral angiopathy with gangrene: Secondary | ICD-10-CM | POA: Diagnosis not present

## 2019-09-12 DIAGNOSIS — L97511 Non-pressure chronic ulcer of other part of right foot limited to breakdown of skin: Secondary | ICD-10-CM | POA: Diagnosis not present

## 2019-09-12 DIAGNOSIS — Z4781 Encounter for orthopedic aftercare following surgical amputation: Secondary | ICD-10-CM | POA: Diagnosis not present

## 2019-09-12 DIAGNOSIS — E11621 Type 2 diabetes mellitus with foot ulcer: Secondary | ICD-10-CM | POA: Diagnosis not present

## 2019-09-12 DIAGNOSIS — L97419 Non-pressure chronic ulcer of right heel and midfoot with unspecified severity: Secondary | ICD-10-CM | POA: Diagnosis not present

## 2019-09-12 DIAGNOSIS — E1169 Type 2 diabetes mellitus with other specified complication: Secondary | ICD-10-CM | POA: Diagnosis not present

## 2019-09-12 DIAGNOSIS — E1152 Type 2 diabetes mellitus with diabetic peripheral angiopathy with gangrene: Secondary | ICD-10-CM | POA: Diagnosis not present

## 2019-09-15 DIAGNOSIS — E1169 Type 2 diabetes mellitus with other specified complication: Secondary | ICD-10-CM | POA: Diagnosis not present

## 2019-09-15 DIAGNOSIS — E1152 Type 2 diabetes mellitus with diabetic peripheral angiopathy with gangrene: Secondary | ICD-10-CM | POA: Diagnosis not present

## 2019-09-15 DIAGNOSIS — L97419 Non-pressure chronic ulcer of right heel and midfoot with unspecified severity: Secondary | ICD-10-CM | POA: Diagnosis not present

## 2019-09-15 DIAGNOSIS — E11621 Type 2 diabetes mellitus with foot ulcer: Secondary | ICD-10-CM | POA: Diagnosis not present

## 2019-09-15 DIAGNOSIS — L97511 Non-pressure chronic ulcer of other part of right foot limited to breakdown of skin: Secondary | ICD-10-CM | POA: Diagnosis not present

## 2019-09-15 DIAGNOSIS — Z4781 Encounter for orthopedic aftercare following surgical amputation: Secondary | ICD-10-CM | POA: Diagnosis not present

## 2019-09-17 DIAGNOSIS — E11621 Type 2 diabetes mellitus with foot ulcer: Secondary | ICD-10-CM | POA: Diagnosis not present

## 2019-09-17 DIAGNOSIS — E1152 Type 2 diabetes mellitus with diabetic peripheral angiopathy with gangrene: Secondary | ICD-10-CM | POA: Diagnosis not present

## 2019-09-17 DIAGNOSIS — Z4781 Encounter for orthopedic aftercare following surgical amputation: Secondary | ICD-10-CM | POA: Diagnosis not present

## 2019-09-17 DIAGNOSIS — L97419 Non-pressure chronic ulcer of right heel and midfoot with unspecified severity: Secondary | ICD-10-CM | POA: Diagnosis not present

## 2019-09-17 DIAGNOSIS — L97511 Non-pressure chronic ulcer of other part of right foot limited to breakdown of skin: Secondary | ICD-10-CM | POA: Diagnosis not present

## 2019-09-17 DIAGNOSIS — E1169 Type 2 diabetes mellitus with other specified complication: Secondary | ICD-10-CM | POA: Diagnosis not present

## 2019-09-19 DIAGNOSIS — Z4781 Encounter for orthopedic aftercare following surgical amputation: Secondary | ICD-10-CM | POA: Diagnosis not present

## 2019-09-19 DIAGNOSIS — L97511 Non-pressure chronic ulcer of other part of right foot limited to breakdown of skin: Secondary | ICD-10-CM | POA: Diagnosis not present

## 2019-09-19 DIAGNOSIS — E11621 Type 2 diabetes mellitus with foot ulcer: Secondary | ICD-10-CM | POA: Diagnosis not present

## 2019-09-19 DIAGNOSIS — E1152 Type 2 diabetes mellitus with diabetic peripheral angiopathy with gangrene: Secondary | ICD-10-CM | POA: Diagnosis not present

## 2019-09-19 DIAGNOSIS — E1169 Type 2 diabetes mellitus with other specified complication: Secondary | ICD-10-CM | POA: Diagnosis not present

## 2019-09-19 DIAGNOSIS — L97419 Non-pressure chronic ulcer of right heel and midfoot with unspecified severity: Secondary | ICD-10-CM | POA: Diagnosis not present

## 2019-09-22 DIAGNOSIS — L97511 Non-pressure chronic ulcer of other part of right foot limited to breakdown of skin: Secondary | ICD-10-CM | POA: Diagnosis not present

## 2019-09-22 DIAGNOSIS — E11621 Type 2 diabetes mellitus with foot ulcer: Secondary | ICD-10-CM | POA: Diagnosis not present

## 2019-09-22 DIAGNOSIS — Z4781 Encounter for orthopedic aftercare following surgical amputation: Secondary | ICD-10-CM | POA: Diagnosis not present

## 2019-09-22 DIAGNOSIS — E1152 Type 2 diabetes mellitus with diabetic peripheral angiopathy with gangrene: Secondary | ICD-10-CM | POA: Diagnosis not present

## 2019-09-22 DIAGNOSIS — L97419 Non-pressure chronic ulcer of right heel and midfoot with unspecified severity: Secondary | ICD-10-CM | POA: Diagnosis not present

## 2019-09-22 DIAGNOSIS — E1169 Type 2 diabetes mellitus with other specified complication: Secondary | ICD-10-CM | POA: Diagnosis not present

## 2019-09-24 DIAGNOSIS — Z4781 Encounter for orthopedic aftercare following surgical amputation: Secondary | ICD-10-CM | POA: Diagnosis not present

## 2019-09-24 DIAGNOSIS — E11621 Type 2 diabetes mellitus with foot ulcer: Secondary | ICD-10-CM | POA: Diagnosis not present

## 2019-09-24 DIAGNOSIS — L97511 Non-pressure chronic ulcer of other part of right foot limited to breakdown of skin: Secondary | ICD-10-CM | POA: Diagnosis not present

## 2019-09-24 DIAGNOSIS — E1152 Type 2 diabetes mellitus with diabetic peripheral angiopathy with gangrene: Secondary | ICD-10-CM | POA: Diagnosis not present

## 2019-09-24 DIAGNOSIS — E1169 Type 2 diabetes mellitus with other specified complication: Secondary | ICD-10-CM | POA: Diagnosis not present

## 2019-09-24 DIAGNOSIS — L97419 Non-pressure chronic ulcer of right heel and midfoot with unspecified severity: Secondary | ICD-10-CM | POA: Diagnosis not present

## 2019-09-25 DIAGNOSIS — S91301A Unspecified open wound, right foot, initial encounter: Secondary | ICD-10-CM | POA: Diagnosis not present

## 2019-09-25 DIAGNOSIS — I739 Peripheral vascular disease, unspecified: Secondary | ICD-10-CM | POA: Diagnosis not present

## 2019-09-25 DIAGNOSIS — I998 Other disorder of circulatory system: Secondary | ICD-10-CM | POA: Diagnosis not present

## 2019-09-25 DIAGNOSIS — Z09 Encounter for follow-up examination after completed treatment for conditions other than malignant neoplasm: Secondary | ICD-10-CM | POA: Diagnosis not present

## 2019-09-26 DIAGNOSIS — E11621 Type 2 diabetes mellitus with foot ulcer: Secondary | ICD-10-CM | POA: Diagnosis not present

## 2019-09-26 DIAGNOSIS — Z4781 Encounter for orthopedic aftercare following surgical amputation: Secondary | ICD-10-CM | POA: Diagnosis not present

## 2019-09-26 DIAGNOSIS — L97419 Non-pressure chronic ulcer of right heel and midfoot with unspecified severity: Secondary | ICD-10-CM | POA: Diagnosis not present

## 2019-09-26 DIAGNOSIS — L97511 Non-pressure chronic ulcer of other part of right foot limited to breakdown of skin: Secondary | ICD-10-CM | POA: Diagnosis not present

## 2019-09-26 DIAGNOSIS — E1152 Type 2 diabetes mellitus with diabetic peripheral angiopathy with gangrene: Secondary | ICD-10-CM | POA: Diagnosis not present

## 2019-09-26 DIAGNOSIS — E1169 Type 2 diabetes mellitus with other specified complication: Secondary | ICD-10-CM | POA: Diagnosis not present

## 2019-09-29 DIAGNOSIS — E1169 Type 2 diabetes mellitus with other specified complication: Secondary | ICD-10-CM | POA: Diagnosis not present

## 2019-09-29 DIAGNOSIS — E11621 Type 2 diabetes mellitus with foot ulcer: Secondary | ICD-10-CM | POA: Diagnosis not present

## 2019-09-29 DIAGNOSIS — E1152 Type 2 diabetes mellitus with diabetic peripheral angiopathy with gangrene: Secondary | ICD-10-CM | POA: Diagnosis not present

## 2019-09-29 DIAGNOSIS — L97419 Non-pressure chronic ulcer of right heel and midfoot with unspecified severity: Secondary | ICD-10-CM | POA: Diagnosis not present

## 2019-09-29 DIAGNOSIS — L97511 Non-pressure chronic ulcer of other part of right foot limited to breakdown of skin: Secondary | ICD-10-CM | POA: Diagnosis not present

## 2019-09-29 DIAGNOSIS — Z4781 Encounter for orthopedic aftercare following surgical amputation: Secondary | ICD-10-CM | POA: Diagnosis not present

## 2019-10-01 DIAGNOSIS — L97419 Non-pressure chronic ulcer of right heel and midfoot with unspecified severity: Secondary | ICD-10-CM | POA: Diagnosis not present

## 2019-10-01 DIAGNOSIS — L97511 Non-pressure chronic ulcer of other part of right foot limited to breakdown of skin: Secondary | ICD-10-CM | POA: Diagnosis not present

## 2019-10-01 DIAGNOSIS — E1152 Type 2 diabetes mellitus with diabetic peripheral angiopathy with gangrene: Secondary | ICD-10-CM | POA: Diagnosis not present

## 2019-10-01 DIAGNOSIS — Z4781 Encounter for orthopedic aftercare following surgical amputation: Secondary | ICD-10-CM | POA: Diagnosis not present

## 2019-10-01 DIAGNOSIS — E1169 Type 2 diabetes mellitus with other specified complication: Secondary | ICD-10-CM | POA: Diagnosis not present

## 2019-10-01 DIAGNOSIS — E11621 Type 2 diabetes mellitus with foot ulcer: Secondary | ICD-10-CM | POA: Diagnosis not present

## 2019-10-03 DIAGNOSIS — L97511 Non-pressure chronic ulcer of other part of right foot limited to breakdown of skin: Secondary | ICD-10-CM | POA: Diagnosis not present

## 2019-10-03 DIAGNOSIS — E1152 Type 2 diabetes mellitus with diabetic peripheral angiopathy with gangrene: Secondary | ICD-10-CM | POA: Diagnosis not present

## 2019-10-03 DIAGNOSIS — Z4781 Encounter for orthopedic aftercare following surgical amputation: Secondary | ICD-10-CM | POA: Diagnosis not present

## 2019-10-03 DIAGNOSIS — E11621 Type 2 diabetes mellitus with foot ulcer: Secondary | ICD-10-CM | POA: Diagnosis not present

## 2019-10-03 DIAGNOSIS — E1169 Type 2 diabetes mellitus with other specified complication: Secondary | ICD-10-CM | POA: Diagnosis not present

## 2019-10-03 DIAGNOSIS — L97419 Non-pressure chronic ulcer of right heel and midfoot with unspecified severity: Secondary | ICD-10-CM | POA: Diagnosis not present

## 2019-10-06 DIAGNOSIS — E1169 Type 2 diabetes mellitus with other specified complication: Secondary | ICD-10-CM | POA: Diagnosis not present

## 2019-10-06 DIAGNOSIS — F17210 Nicotine dependence, cigarettes, uncomplicated: Secondary | ICD-10-CM | POA: Diagnosis not present

## 2019-10-06 DIAGNOSIS — Z794 Long term (current) use of insulin: Secondary | ICD-10-CM | POA: Diagnosis not present

## 2019-10-06 DIAGNOSIS — M86171 Other acute osteomyelitis, right ankle and foot: Secondary | ICD-10-CM | POA: Diagnosis not present

## 2019-10-06 DIAGNOSIS — K5900 Constipation, unspecified: Secondary | ICD-10-CM | POA: Diagnosis not present

## 2019-10-06 DIAGNOSIS — Z8631 Personal history of diabetic foot ulcer: Secondary | ICD-10-CM | POA: Diagnosis not present

## 2019-10-06 DIAGNOSIS — E1152 Type 2 diabetes mellitus with diabetic peripheral angiopathy with gangrene: Secondary | ICD-10-CM | POA: Diagnosis not present

## 2019-10-06 DIAGNOSIS — Z7902 Long term (current) use of antithrombotics/antiplatelets: Secondary | ICD-10-CM | POA: Diagnosis not present

## 2019-10-06 DIAGNOSIS — B965 Pseudomonas (aeruginosa) (mallei) (pseudomallei) as the cause of diseases classified elsewhere: Secondary | ICD-10-CM | POA: Diagnosis not present

## 2019-10-06 DIAGNOSIS — M797 Fibromyalgia: Secondary | ICD-10-CM | POA: Diagnosis not present

## 2019-10-06 DIAGNOSIS — Z89511 Acquired absence of right leg below knee: Secondary | ICD-10-CM | POA: Diagnosis not present

## 2019-10-06 DIAGNOSIS — D649 Anemia, unspecified: Secondary | ICD-10-CM | POA: Diagnosis not present

## 2019-10-06 DIAGNOSIS — B9562 Methicillin resistant Staphylococcus aureus infection as the cause of diseases classified elsewhere: Secondary | ICD-10-CM | POA: Diagnosis not present

## 2019-10-06 DIAGNOSIS — I252 Old myocardial infarction: Secondary | ICD-10-CM | POA: Diagnosis not present

## 2019-10-06 DIAGNOSIS — R32 Unspecified urinary incontinence: Secondary | ICD-10-CM | POA: Diagnosis not present

## 2019-10-06 DIAGNOSIS — Z8673 Personal history of transient ischemic attack (TIA), and cerebral infarction without residual deficits: Secondary | ICD-10-CM | POA: Diagnosis not present

## 2019-10-06 DIAGNOSIS — L97419 Non-pressure chronic ulcer of right heel and midfoot with unspecified severity: Secondary | ICD-10-CM | POA: Diagnosis not present

## 2019-10-06 DIAGNOSIS — E11621 Type 2 diabetes mellitus with foot ulcer: Secondary | ICD-10-CM | POA: Diagnosis not present

## 2019-10-06 DIAGNOSIS — R3914 Feeling of incomplete bladder emptying: Secondary | ICD-10-CM | POA: Diagnosis not present

## 2019-10-06 DIAGNOSIS — Z466 Encounter for fitting and adjustment of urinary device: Secondary | ICD-10-CM | POA: Diagnosis not present

## 2019-10-06 DIAGNOSIS — I2510 Atherosclerotic heart disease of native coronary artery without angina pectoris: Secondary | ICD-10-CM | POA: Diagnosis not present

## 2019-10-06 DIAGNOSIS — M545 Low back pain: Secondary | ICD-10-CM | POA: Diagnosis not present

## 2019-10-06 DIAGNOSIS — I5020 Unspecified systolic (congestive) heart failure: Secondary | ICD-10-CM | POA: Diagnosis not present

## 2019-10-06 DIAGNOSIS — G4700 Insomnia, unspecified: Secondary | ICD-10-CM | POA: Diagnosis not present

## 2019-10-06 DIAGNOSIS — Z79899 Other long term (current) drug therapy: Secondary | ICD-10-CM | POA: Diagnosis not present

## 2019-10-06 DIAGNOSIS — Z4781 Encounter for orthopedic aftercare following surgical amputation: Secondary | ICD-10-CM | POA: Diagnosis not present

## 2019-10-06 DIAGNOSIS — Z4801 Encounter for change or removal of surgical wound dressing: Secondary | ICD-10-CM | POA: Diagnosis not present

## 2019-10-06 DIAGNOSIS — N319 Neuromuscular dysfunction of bladder, unspecified: Secondary | ICD-10-CM | POA: Diagnosis not present

## 2019-10-06 DIAGNOSIS — L97511 Non-pressure chronic ulcer of other part of right foot limited to breakdown of skin: Secondary | ICD-10-CM | POA: Diagnosis not present

## 2019-10-06 DIAGNOSIS — Z79891 Long term (current) use of opiate analgesic: Secondary | ICD-10-CM | POA: Diagnosis not present

## 2019-10-06 DIAGNOSIS — I110 Hypertensive heart disease with heart failure: Secondary | ICD-10-CM | POA: Diagnosis not present

## 2019-10-06 DIAGNOSIS — Z7982 Long term (current) use of aspirin: Secondary | ICD-10-CM | POA: Diagnosis not present

## 2019-10-08 DIAGNOSIS — E1152 Type 2 diabetes mellitus with diabetic peripheral angiopathy with gangrene: Secondary | ICD-10-CM | POA: Diagnosis not present

## 2019-10-08 DIAGNOSIS — Z4781 Encounter for orthopedic aftercare following surgical amputation: Secondary | ICD-10-CM | POA: Diagnosis not present

## 2019-10-08 DIAGNOSIS — Z4801 Encounter for change or removal of surgical wound dressing: Secondary | ICD-10-CM | POA: Diagnosis not present

## 2019-10-08 DIAGNOSIS — I2510 Atherosclerotic heart disease of native coronary artery without angina pectoris: Secondary | ICD-10-CM | POA: Diagnosis not present

## 2019-10-08 DIAGNOSIS — I252 Old myocardial infarction: Secondary | ICD-10-CM | POA: Diagnosis not present

## 2019-10-08 DIAGNOSIS — I110 Hypertensive heart disease with heart failure: Secondary | ICD-10-CM | POA: Diagnosis not present

## 2019-10-10 DIAGNOSIS — I2510 Atherosclerotic heart disease of native coronary artery without angina pectoris: Secondary | ICD-10-CM | POA: Diagnosis not present

## 2019-10-10 DIAGNOSIS — E1152 Type 2 diabetes mellitus with diabetic peripheral angiopathy with gangrene: Secondary | ICD-10-CM | POA: Diagnosis not present

## 2019-10-10 DIAGNOSIS — Z4801 Encounter for change or removal of surgical wound dressing: Secondary | ICD-10-CM | POA: Diagnosis not present

## 2019-10-10 DIAGNOSIS — I110 Hypertensive heart disease with heart failure: Secondary | ICD-10-CM | POA: Diagnosis not present

## 2019-10-10 DIAGNOSIS — Z4781 Encounter for orthopedic aftercare following surgical amputation: Secondary | ICD-10-CM | POA: Diagnosis not present

## 2019-10-10 DIAGNOSIS — I252 Old myocardial infarction: Secondary | ICD-10-CM | POA: Diagnosis not present

## 2019-10-13 DIAGNOSIS — Z4781 Encounter for orthopedic aftercare following surgical amputation: Secondary | ICD-10-CM | POA: Diagnosis not present

## 2019-10-13 DIAGNOSIS — I110 Hypertensive heart disease with heart failure: Secondary | ICD-10-CM | POA: Diagnosis not present

## 2019-10-13 DIAGNOSIS — E1152 Type 2 diabetes mellitus with diabetic peripheral angiopathy with gangrene: Secondary | ICD-10-CM | POA: Diagnosis not present

## 2019-10-13 DIAGNOSIS — Z4801 Encounter for change or removal of surgical wound dressing: Secondary | ICD-10-CM | POA: Diagnosis not present

## 2019-10-13 DIAGNOSIS — I252 Old myocardial infarction: Secondary | ICD-10-CM | POA: Diagnosis not present

## 2019-10-13 DIAGNOSIS — I2510 Atherosclerotic heart disease of native coronary artery without angina pectoris: Secondary | ICD-10-CM | POA: Diagnosis not present

## 2019-10-15 DIAGNOSIS — Z4781 Encounter for orthopedic aftercare following surgical amputation: Secondary | ICD-10-CM | POA: Diagnosis not present

## 2019-10-15 DIAGNOSIS — Z4801 Encounter for change or removal of surgical wound dressing: Secondary | ICD-10-CM | POA: Diagnosis not present

## 2019-10-15 DIAGNOSIS — I252 Old myocardial infarction: Secondary | ICD-10-CM | POA: Diagnosis not present

## 2019-10-15 DIAGNOSIS — I110 Hypertensive heart disease with heart failure: Secondary | ICD-10-CM | POA: Diagnosis not present

## 2019-10-15 DIAGNOSIS — I2510 Atherosclerotic heart disease of native coronary artery without angina pectoris: Secondary | ICD-10-CM | POA: Diagnosis not present

## 2019-10-15 DIAGNOSIS — E1152 Type 2 diabetes mellitus with diabetic peripheral angiopathy with gangrene: Secondary | ICD-10-CM | POA: Diagnosis not present

## 2019-10-17 DIAGNOSIS — Z4801 Encounter for change or removal of surgical wound dressing: Secondary | ICD-10-CM | POA: Diagnosis not present

## 2019-10-17 DIAGNOSIS — I2510 Atherosclerotic heart disease of native coronary artery without angina pectoris: Secondary | ICD-10-CM | POA: Diagnosis not present

## 2019-10-17 DIAGNOSIS — I110 Hypertensive heart disease with heart failure: Secondary | ICD-10-CM | POA: Diagnosis not present

## 2019-10-17 DIAGNOSIS — Z4781 Encounter for orthopedic aftercare following surgical amputation: Secondary | ICD-10-CM | POA: Diagnosis not present

## 2019-10-17 DIAGNOSIS — Z01812 Encounter for preprocedural laboratory examination: Secondary | ICD-10-CM | POA: Diagnosis not present

## 2019-10-17 DIAGNOSIS — I252 Old myocardial infarction: Secondary | ICD-10-CM | POA: Diagnosis not present

## 2019-10-17 DIAGNOSIS — E1152 Type 2 diabetes mellitus with diabetic peripheral angiopathy with gangrene: Secondary | ICD-10-CM | POA: Diagnosis not present

## 2019-10-17 DIAGNOSIS — Z20822 Contact with and (suspected) exposure to covid-19: Secondary | ICD-10-CM | POA: Diagnosis not present

## 2019-10-20 DIAGNOSIS — I252 Old myocardial infarction: Secondary | ICD-10-CM | POA: Diagnosis not present

## 2019-10-20 DIAGNOSIS — E1152 Type 2 diabetes mellitus with diabetic peripheral angiopathy with gangrene: Secondary | ICD-10-CM | POA: Diagnosis not present

## 2019-10-20 DIAGNOSIS — Z4801 Encounter for change or removal of surgical wound dressing: Secondary | ICD-10-CM | POA: Diagnosis not present

## 2019-10-20 DIAGNOSIS — I2510 Atherosclerotic heart disease of native coronary artery without angina pectoris: Secondary | ICD-10-CM | POA: Diagnosis not present

## 2019-10-20 DIAGNOSIS — Z4781 Encounter for orthopedic aftercare following surgical amputation: Secondary | ICD-10-CM | POA: Diagnosis not present

## 2019-10-20 DIAGNOSIS — I110 Hypertensive heart disease with heart failure: Secondary | ICD-10-CM | POA: Diagnosis not present

## 2019-10-21 DIAGNOSIS — R279 Unspecified lack of coordination: Secondary | ICD-10-CM | POA: Diagnosis not present

## 2019-10-21 DIAGNOSIS — I5022 Chronic systolic (congestive) heart failure: Secondary | ICD-10-CM | POA: Diagnosis present

## 2019-10-21 DIAGNOSIS — I96 Gangrene, not elsewhere classified: Secondary | ICD-10-CM | POA: Diagnosis not present

## 2019-10-21 DIAGNOSIS — M86161 Other acute osteomyelitis, right tibia and fibula: Secondary | ICD-10-CM | POA: Diagnosis not present

## 2019-10-21 DIAGNOSIS — G8918 Other acute postprocedural pain: Secondary | ICD-10-CM | POA: Diagnosis not present

## 2019-10-21 DIAGNOSIS — I509 Heart failure, unspecified: Secondary | ICD-10-CM | POA: Diagnosis not present

## 2019-10-21 DIAGNOSIS — L97419 Non-pressure chronic ulcer of right heel and midfoot with unspecified severity: Secondary | ICD-10-CM | POA: Diagnosis not present

## 2019-10-21 DIAGNOSIS — R5381 Other malaise: Secondary | ICD-10-CM | POA: Diagnosis not present

## 2019-10-21 DIAGNOSIS — Z20822 Contact with and (suspected) exposure to covid-19: Secondary | ICD-10-CM | POA: Diagnosis present

## 2019-10-21 DIAGNOSIS — I130 Hypertensive heart and chronic kidney disease with heart failure and stage 1 through stage 4 chronic kidney disease, or unspecified chronic kidney disease: Secondary | ICD-10-CM | POA: Diagnosis present

## 2019-10-21 DIAGNOSIS — Z8673 Personal history of transient ischemic attack (TIA), and cerebral infarction without residual deficits: Secondary | ICD-10-CM | POA: Diagnosis not present

## 2019-10-21 DIAGNOSIS — M797 Fibromyalgia: Secondary | ICD-10-CM | POA: Diagnosis present

## 2019-10-21 DIAGNOSIS — Z8679 Personal history of other diseases of the circulatory system: Secondary | ICD-10-CM | POA: Diagnosis not present

## 2019-10-21 DIAGNOSIS — M6281 Muscle weakness (generalized): Secondary | ICD-10-CM | POA: Diagnosis not present

## 2019-10-21 DIAGNOSIS — Z95820 Peripheral vascular angioplasty status with implants and grafts: Secondary | ICD-10-CM | POA: Diagnosis not present

## 2019-10-21 DIAGNOSIS — E11621 Type 2 diabetes mellitus with foot ulcer: Secondary | ICD-10-CM | POA: Diagnosis present

## 2019-10-21 DIAGNOSIS — Z89511 Acquired absence of right leg below knee: Secondary | ICD-10-CM | POA: Diagnosis not present

## 2019-10-21 DIAGNOSIS — E1152 Type 2 diabetes mellitus with diabetic peripheral angiopathy with gangrene: Secondary | ICD-10-CM | POA: Diagnosis present

## 2019-10-21 DIAGNOSIS — I2510 Atherosclerotic heart disease of native coronary artery without angina pectoris: Secondary | ICD-10-CM | POA: Diagnosis present

## 2019-10-21 DIAGNOSIS — M79606 Pain in leg, unspecified: Secondary | ICD-10-CM | POA: Diagnosis not present

## 2019-10-21 DIAGNOSIS — N189 Chronic kidney disease, unspecified: Secondary | ICD-10-CM | POA: Diagnosis present

## 2019-10-21 DIAGNOSIS — E1122 Type 2 diabetes mellitus with diabetic chronic kidney disease: Secondary | ICD-10-CM | POA: Diagnosis present

## 2019-10-21 DIAGNOSIS — I252 Old myocardial infarction: Secondary | ICD-10-CM | POA: Diagnosis not present

## 2019-10-21 DIAGNOSIS — M79604 Pain in right leg: Secondary | ICD-10-CM | POA: Diagnosis not present

## 2019-10-28 DIAGNOSIS — Z4801 Encounter for change or removal of surgical wound dressing: Secondary | ICD-10-CM | POA: Diagnosis not present

## 2019-10-28 DIAGNOSIS — I2510 Atherosclerotic heart disease of native coronary artery without angina pectoris: Secondary | ICD-10-CM | POA: Diagnosis not present

## 2019-10-28 DIAGNOSIS — E1152 Type 2 diabetes mellitus with diabetic peripheral angiopathy with gangrene: Secondary | ICD-10-CM | POA: Diagnosis not present

## 2019-10-28 DIAGNOSIS — I110 Hypertensive heart disease with heart failure: Secondary | ICD-10-CM | POA: Diagnosis not present

## 2019-10-28 DIAGNOSIS — I252 Old myocardial infarction: Secondary | ICD-10-CM | POA: Diagnosis not present

## 2019-10-28 DIAGNOSIS — Z4781 Encounter for orthopedic aftercare following surgical amputation: Secondary | ICD-10-CM | POA: Diagnosis not present

## 2019-10-30 DIAGNOSIS — Z4801 Encounter for change or removal of surgical wound dressing: Secondary | ICD-10-CM | POA: Diagnosis not present

## 2019-10-30 DIAGNOSIS — I2510 Atherosclerotic heart disease of native coronary artery without angina pectoris: Secondary | ICD-10-CM | POA: Diagnosis not present

## 2019-10-30 DIAGNOSIS — E1152 Type 2 diabetes mellitus with diabetic peripheral angiopathy with gangrene: Secondary | ICD-10-CM | POA: Diagnosis not present

## 2019-10-30 DIAGNOSIS — I252 Old myocardial infarction: Secondary | ICD-10-CM | POA: Diagnosis not present

## 2019-10-30 DIAGNOSIS — Z4781 Encounter for orthopedic aftercare following surgical amputation: Secondary | ICD-10-CM | POA: Diagnosis not present

## 2019-10-30 DIAGNOSIS — I110 Hypertensive heart disease with heart failure: Secondary | ICD-10-CM | POA: Diagnosis not present

## 2019-10-31 DIAGNOSIS — Z4781 Encounter for orthopedic aftercare following surgical amputation: Secondary | ICD-10-CM | POA: Diagnosis not present

## 2019-10-31 DIAGNOSIS — I2510 Atherosclerotic heart disease of native coronary artery without angina pectoris: Secondary | ICD-10-CM | POA: Diagnosis not present

## 2019-10-31 DIAGNOSIS — I110 Hypertensive heart disease with heart failure: Secondary | ICD-10-CM | POA: Diagnosis not present

## 2019-10-31 DIAGNOSIS — I252 Old myocardial infarction: Secondary | ICD-10-CM | POA: Diagnosis not present

## 2019-10-31 DIAGNOSIS — E1152 Type 2 diabetes mellitus with diabetic peripheral angiopathy with gangrene: Secondary | ICD-10-CM | POA: Diagnosis not present

## 2019-10-31 DIAGNOSIS — Z4801 Encounter for change or removal of surgical wound dressing: Secondary | ICD-10-CM | POA: Diagnosis not present

## 2019-11-04 DIAGNOSIS — Z4781 Encounter for orthopedic aftercare following surgical amputation: Secondary | ICD-10-CM | POA: Diagnosis not present

## 2019-11-04 DIAGNOSIS — I110 Hypertensive heart disease with heart failure: Secondary | ICD-10-CM | POA: Diagnosis not present

## 2019-11-04 DIAGNOSIS — I252 Old myocardial infarction: Secondary | ICD-10-CM | POA: Diagnosis not present

## 2019-11-04 DIAGNOSIS — E1152 Type 2 diabetes mellitus with diabetic peripheral angiopathy with gangrene: Secondary | ICD-10-CM | POA: Diagnosis not present

## 2019-11-04 DIAGNOSIS — I2510 Atherosclerotic heart disease of native coronary artery without angina pectoris: Secondary | ICD-10-CM | POA: Diagnosis not present

## 2019-11-04 DIAGNOSIS — Z4801 Encounter for change or removal of surgical wound dressing: Secondary | ICD-10-CM | POA: Diagnosis not present

## 2019-11-05 DIAGNOSIS — R32 Unspecified urinary incontinence: Secondary | ICD-10-CM | POA: Diagnosis not present

## 2019-11-05 DIAGNOSIS — K5900 Constipation, unspecified: Secondary | ICD-10-CM | POA: Diagnosis not present

## 2019-11-05 DIAGNOSIS — R3914 Feeling of incomplete bladder emptying: Secondary | ICD-10-CM | POA: Diagnosis not present

## 2019-11-05 DIAGNOSIS — I5020 Unspecified systolic (congestive) heart failure: Secondary | ICD-10-CM | POA: Diagnosis not present

## 2019-11-05 DIAGNOSIS — L97516 Non-pressure chronic ulcer of other part of right foot with bone involvement without evidence of necrosis: Secondary | ICD-10-CM | POA: Diagnosis not present

## 2019-11-05 DIAGNOSIS — I252 Old myocardial infarction: Secondary | ICD-10-CM | POA: Diagnosis not present

## 2019-11-05 DIAGNOSIS — M797 Fibromyalgia: Secondary | ICD-10-CM | POA: Diagnosis not present

## 2019-11-05 DIAGNOSIS — Z4801 Encounter for change or removal of surgical wound dressing: Secondary | ICD-10-CM | POA: Diagnosis not present

## 2019-11-05 DIAGNOSIS — B965 Pseudomonas (aeruginosa) (mallei) (pseudomallei) as the cause of diseases classified elsewhere: Secondary | ICD-10-CM | POA: Diagnosis not present

## 2019-11-05 DIAGNOSIS — M86171 Other acute osteomyelitis, right ankle and foot: Secondary | ICD-10-CM | POA: Diagnosis not present

## 2019-11-05 DIAGNOSIS — D649 Anemia, unspecified: Secondary | ICD-10-CM | POA: Diagnosis not present

## 2019-11-05 DIAGNOSIS — E11621 Type 2 diabetes mellitus with foot ulcer: Secondary | ICD-10-CM | POA: Diagnosis not present

## 2019-11-05 DIAGNOSIS — E1169 Type 2 diabetes mellitus with other specified complication: Secondary | ICD-10-CM | POA: Diagnosis not present

## 2019-11-05 DIAGNOSIS — B9562 Methicillin resistant Staphylococcus aureus infection as the cause of diseases classified elsewhere: Secondary | ICD-10-CM | POA: Diagnosis not present

## 2019-11-05 DIAGNOSIS — E1152 Type 2 diabetes mellitus with diabetic peripheral angiopathy with gangrene: Secondary | ICD-10-CM | POA: Diagnosis not present

## 2019-11-05 DIAGNOSIS — Z794 Long term (current) use of insulin: Secondary | ICD-10-CM | POA: Diagnosis not present

## 2019-11-05 DIAGNOSIS — N319 Neuromuscular dysfunction of bladder, unspecified: Secondary | ICD-10-CM | POA: Diagnosis not present

## 2019-11-05 DIAGNOSIS — Z466 Encounter for fitting and adjustment of urinary device: Secondary | ICD-10-CM | POA: Diagnosis not present

## 2019-11-05 DIAGNOSIS — Z4781 Encounter for orthopedic aftercare following surgical amputation: Secondary | ICD-10-CM | POA: Diagnosis not present

## 2019-11-05 DIAGNOSIS — G4700 Insomnia, unspecified: Secondary | ICD-10-CM | POA: Diagnosis not present

## 2019-11-05 DIAGNOSIS — I2510 Atherosclerotic heart disease of native coronary artery without angina pectoris: Secondary | ICD-10-CM | POA: Diagnosis not present

## 2019-11-05 DIAGNOSIS — F17210 Nicotine dependence, cigarettes, uncomplicated: Secondary | ICD-10-CM | POA: Diagnosis not present

## 2019-11-05 DIAGNOSIS — M545 Low back pain: Secondary | ICD-10-CM | POA: Diagnosis not present

## 2019-11-05 DIAGNOSIS — I110 Hypertensive heart disease with heart failure: Secondary | ICD-10-CM | POA: Diagnosis not present

## 2019-11-05 DIAGNOSIS — L97419 Non-pressure chronic ulcer of right heel and midfoot with unspecified severity: Secondary | ICD-10-CM | POA: Diagnosis not present

## 2019-11-06 DIAGNOSIS — Z4801 Encounter for change or removal of surgical wound dressing: Secondary | ICD-10-CM | POA: Diagnosis not present

## 2019-11-06 DIAGNOSIS — L97419 Non-pressure chronic ulcer of right heel and midfoot with unspecified severity: Secondary | ICD-10-CM | POA: Diagnosis not present

## 2019-11-06 DIAGNOSIS — E1152 Type 2 diabetes mellitus with diabetic peripheral angiopathy with gangrene: Secondary | ICD-10-CM | POA: Diagnosis not present

## 2019-11-06 DIAGNOSIS — L97516 Non-pressure chronic ulcer of other part of right foot with bone involvement without evidence of necrosis: Secondary | ICD-10-CM | POA: Diagnosis not present

## 2019-11-06 DIAGNOSIS — E11621 Type 2 diabetes mellitus with foot ulcer: Secondary | ICD-10-CM | POA: Diagnosis not present

## 2019-11-06 DIAGNOSIS — Z4781 Encounter for orthopedic aftercare following surgical amputation: Secondary | ICD-10-CM | POA: Diagnosis not present

## 2019-11-07 DIAGNOSIS — Z4801 Encounter for change or removal of surgical wound dressing: Secondary | ICD-10-CM | POA: Diagnosis not present

## 2019-11-07 DIAGNOSIS — E1152 Type 2 diabetes mellitus with diabetic peripheral angiopathy with gangrene: Secondary | ICD-10-CM | POA: Diagnosis not present

## 2019-11-07 DIAGNOSIS — E11621 Type 2 diabetes mellitus with foot ulcer: Secondary | ICD-10-CM | POA: Diagnosis not present

## 2019-11-07 DIAGNOSIS — L97516 Non-pressure chronic ulcer of other part of right foot with bone involvement without evidence of necrosis: Secondary | ICD-10-CM | POA: Diagnosis not present

## 2019-11-07 DIAGNOSIS — L97419 Non-pressure chronic ulcer of right heel and midfoot with unspecified severity: Secondary | ICD-10-CM | POA: Diagnosis not present

## 2019-11-07 DIAGNOSIS — Z4781 Encounter for orthopedic aftercare following surgical amputation: Secondary | ICD-10-CM | POA: Diagnosis not present

## 2019-11-10 DIAGNOSIS — L97419 Non-pressure chronic ulcer of right heel and midfoot with unspecified severity: Secondary | ICD-10-CM | POA: Diagnosis not present

## 2019-11-10 DIAGNOSIS — E11621 Type 2 diabetes mellitus with foot ulcer: Secondary | ICD-10-CM | POA: Diagnosis not present

## 2019-11-10 DIAGNOSIS — Z4781 Encounter for orthopedic aftercare following surgical amputation: Secondary | ICD-10-CM | POA: Diagnosis not present

## 2019-11-10 DIAGNOSIS — L97516 Non-pressure chronic ulcer of other part of right foot with bone involvement without evidence of necrosis: Secondary | ICD-10-CM | POA: Diagnosis not present

## 2019-11-10 DIAGNOSIS — E1152 Type 2 diabetes mellitus with diabetic peripheral angiopathy with gangrene: Secondary | ICD-10-CM | POA: Diagnosis not present

## 2019-11-10 DIAGNOSIS — Z4801 Encounter for change or removal of surgical wound dressing: Secondary | ICD-10-CM | POA: Diagnosis not present

## 2019-11-11 ENCOUNTER — Other Ambulatory Visit: Payer: Self-pay

## 2019-11-11 ENCOUNTER — Encounter: Payer: Self-pay | Admitting: Internal Medicine

## 2019-11-11 ENCOUNTER — Ambulatory Visit (INDEPENDENT_AMBULATORY_CARE_PROVIDER_SITE_OTHER): Payer: Medicare Other | Admitting: Internal Medicine

## 2019-11-11 VITALS — BP 164/80 | HR 82

## 2019-11-11 DIAGNOSIS — J410 Simple chronic bronchitis: Secondary | ICD-10-CM | POA: Diagnosis not present

## 2019-11-11 DIAGNOSIS — L97419 Non-pressure chronic ulcer of right heel and midfoot with unspecified severity: Secondary | ICD-10-CM | POA: Diagnosis not present

## 2019-11-11 DIAGNOSIS — S88912A Complete traumatic amputation of left lower leg, level unspecified, initial encounter: Secondary | ICD-10-CM | POA: Insufficient documentation

## 2019-11-11 DIAGNOSIS — S88911A Complete traumatic amputation of right lower leg, level unspecified, initial encounter: Secondary | ICD-10-CM

## 2019-11-11 DIAGNOSIS — Z4781 Encounter for orthopedic aftercare following surgical amputation: Secondary | ICD-10-CM | POA: Diagnosis not present

## 2019-11-11 DIAGNOSIS — E1169 Type 2 diabetes mellitus with other specified complication: Secondary | ICD-10-CM

## 2019-11-11 DIAGNOSIS — E1152 Type 2 diabetes mellitus with diabetic peripheral angiopathy with gangrene: Secondary | ICD-10-CM | POA: Diagnosis not present

## 2019-11-11 DIAGNOSIS — Z4801 Encounter for change or removal of surgical wound dressing: Secondary | ICD-10-CM | POA: Diagnosis not present

## 2019-11-11 DIAGNOSIS — M797 Fibromyalgia: Secondary | ICD-10-CM

## 2019-11-11 DIAGNOSIS — S88912D Complete traumatic amputation of left lower leg, level unspecified, subsequent encounter: Secondary | ICD-10-CM

## 2019-11-11 DIAGNOSIS — L97516 Non-pressure chronic ulcer of other part of right foot with bone involvement without evidence of necrosis: Secondary | ICD-10-CM | POA: Diagnosis not present

## 2019-11-11 DIAGNOSIS — E11621 Type 2 diabetes mellitus with foot ulcer: Secondary | ICD-10-CM | POA: Diagnosis not present

## 2019-11-11 DIAGNOSIS — Z794 Long term (current) use of insulin: Secondary | ICD-10-CM

## 2019-11-11 MED ORDER — HUMULIN 70/30 (70-30) 100 UNIT/ML ~~LOC~~ SUSP: 30.0000 [IU] | Freq: Two times a day (BID) | SUBCUTANEOUS | 6 refills | Status: AC

## 2019-11-11 MED ORDER — EZETIMIBE 10 MG PO TABS: 10.0000 mg | ORAL_TABLET | Freq: Every day | ORAL | 4 refills | Status: AC

## 2019-11-11 NOTE — Assessment & Plan Note (Signed)
Fibromyalgia is stable on todays visit.

## 2019-11-11 NOTE — Assessment & Plan Note (Signed)
BS  138,  Continue  Sliding  Scale  insulin

## 2019-11-11 NOTE — Progress Notes (Signed)
Established Patient Office Visit  SUBJECTIVE:  Subjective  Patient ID: Colleen Peters, female    DOB: Aug 24, 1960  Age: 59 y.o. MRN: [id number]  CC:  Chief Complaint  Patient presents with  . Pain    HPI Colleen Peters is a 59 y.o. female presenting today for an evaluation of new pain.  She underwent bilateral leg amputation below the knee on 10/21/2019 due to her diabetic peripheral angiopathy without gangrene.   She notes that she has been experiencing some phantom pains. She has also had some shortness of breath, but she ate some cough drops which seemed to help open her up. She has been followed by a home health nurse. She was given pain management medication by her vascular surgeon.   Her blood sugars have been around 100 regularly. Her blood pressure today is 164/80.         Past Medical History:  Diagnosis Date  . Diabetes mellitus without complication ([place])   . Fibromyalgia   . Hypertension   . Leg cramping   . Nerve pain   . Stroke [facility])     Past Surgical History:  Procedure Laterality Date  . ABDOMINAL HYSTERECTOMY    . APPENDECTOMY    . BACK SURGERY    . bone spur disease    . CHOLECYSTECTOMY    . CORONARY ANGIOGRAPHY N/A 01/31/2018   Procedure: CORONARY ANGIOGRAPHY;  Surgeon: Dionisio David, MD;  Location: [place] [facility];  Service: Cardiovascular;  Laterality: N/A;  . CORONARY STENT INTERVENTION N/A 01/31/2018   Procedure: CORONARY STENT INTERVENTION;  Surgeon: Isaias Cowman, MD;  Location: [place] [facility];  Service: Cardiovascular;  Laterality: N/A;  . LEFT HEART CATH Right 01/31/2018   Procedure: Left Heart Cath;  Surgeon: Dionisio David, MD;  Location: [place] [facility];  Service: Cardiovascular;  Laterality: Right;  . lt elebow surgery  2008  . ovarian surgery for cyst    . Pacemaker to bladder    . rt great toe surgery      History reviewed. No pertinent family history.  Social History   Socioeconomic History    . Marital status: Married    Spouse name: Not on file  . Number of children: Not on file  . Years of education: Not on file  . Highest education level: Not on file  Occupational History  . Not on file  Tobacco Use  . Smoking status: Current Every Day Smoker    Packs/day: 1.00    Types: Cigarettes  . Smokeless tobacco: Never Used  Substance and Sexual Activity  . Alcohol use: No  . Drug use: No  . Sexual activity: Not on file  Other Topics Concern  . Not on file  Social History Narrative  . Not on file   Social Determinants of Health   Financial Resource Strain:   . Difficulty of Paying Living Expenses: Not on file  Food Insecurity:   . Worried About Charity fundraiser in the Last Year: Not on file  . Ran Out of Food in the Last Year: Not on file  Transportation Needs:   . Lack of Transportation (Medical): Not on file  . Lack of Transportation (Non-Medical): Not on file  Physical Activity:   . Days of Exercise per Week: Not on file  . Minutes of Exercise per Session: Not on file  Stress:   . Feeling of Stress : Not on file  Social Connections:   . Frequency of  Communication with Friends and Family: Not on file  . Frequency of Social Gatherings with Friends and Family: Not on file  . Attends Religious Services: Not on file  . Active Member of Clubs or Organizations: Not on file  . Attends Archivist Meetings: Not on file  . Marital Status: Not on file  Intimate Partner Violence:   . Fear of Current or Ex-Partner: Not on file  . Emotionally Abused: Not on file  . Physically Abused: Not on file  . Sexually Abused: Not on file     Current Outpatient Medications:  .  aspirin 81 MG chewable tablet, Chew 1 tablet (81 mg total) by mouth daily., Disp: , Rfl:  .  atorvastatin (LIPITOR) 80 MG tablet, Take 1 tablet (80 mg total) by mouth at bedtime., Disp: 30 tablet, Rfl: 7 .  carvedilol (COREG) 6.25 MG tablet, Take 1 tablet (6.25 mg total) by mouth 2 (two) times  daily with a meal., Disp: 60 tablet, Rfl: 0 .  clopidogrel (PLAVIX) 75 MG tablet, Take 1 tablet (75 mg total) by mouth daily with breakfast., Disp: 30 tablet, Rfl: 0 .  ezetimibe (ZETIA) 10 MG tablet, Take 1 tablet (10 mg total) by mouth daily., Disp: 60 tablet, Rfl: 4 .  furosemide (LASIX) 40 MG tablet, Take 1 tablet (40 mg total) by mouth 2 (two) times daily., Disp: 30 tablet, Rfl: 0 .  HUMULIN 70/30 (70-30) 100 UNIT/ML injection, Inject 30 Units into the skin 2 (two) times daily with a meal., Disp: 10 mL, Rfl: 6 .  insulin aspart protamine- aspart (NOVOLOG MIX 70/30) (70-30) 100 UNIT/ML injection, Inject 0-50 Units into the skin 2 (two) times daily with a meal. Patient reports that she takes 70/30 BID based on glucose (if glucose good then she does not take any; if glucose <250 mg/d she takes 25 units if she is going to eat; if glucose >250 and she is going to eat she takes 50 units), Disp: , Rfl:  .  isosorbide mononitrate (IMDUR) 30 MG 24 hr tablet, Take 30 mg by mouth daily., Disp: , Rfl:  .  lisinopril (ZESTRIL) 20 MG tablet, Take 20 mg by mouth daily., Disp: , Rfl:  .  metFORMIN (GLUCOPHAGE-XR) 500 MG 24 hr tablet, Take 3 tablets (1,500 mg total) by mouth daily., Disp: 30 tablet, Rfl: 7 .  nitroGLYCERIN (NITROSTAT) 0.4 MG SL tablet, Place 0.4 mg under the tongue every 5 (five) minutes as needed for chest pain., Disp: , Rfl:  .  oxyCODONE-acetaminophen (PERCOCET) 7.5-325 MG tablet, Take 1 tablet by mouth daily., Disp: 30 tablet, Rfl: 0 .  triamcinolone cream (KENALOG) 0.1 %, Apply 1 application topically 2 (two) times daily., Disp: , Rfl:    Allergies  Allergen Reactions  . Relafen [Nabumetone]   . Sulfamethoxazole-Trimethoprim Nausea And Vomiting and Nausea Only    ROS Review of Systems  Constitutional: Negative.  Negative for chills and fever.  HENT: Negative.   Eyes: Negative.   Respiratory: Positive for shortness of breath. Negative for cough.   Cardiovascular: Negative.  Negative  for leg swelling.  Gastrointestinal: Negative.   Endocrine: Negative.   Genitourinary: Negative.   Musculoskeletal:       Pain due to recent amputation  Skin: Negative.   Allergic/Immunologic: Negative.   Neurological: Negative.   Hematological: Negative.   Psychiatric/Behavioral: Negative.   All other systems reviewed and are negative.    OBJECTIVE:    Physical Exam Vitals reviewed.  Constitutional:  Appearance: Normal appearance.  HENT:     Mouth/Throat:     Mouth: Mucous membranes are moist.  Eyes:     Pupils: Pupils are equal, round, and reactive to light.  Neck:     Vascular: No carotid bruit.  Cardiovascular:     Rate and Rhythm: Normal rate and regular rhythm.     Pulses: Normal pulses.     Heart sounds: Normal heart sounds.  Pulmonary:     Effort: Pulmonary effort is normal.     Breath sounds: Normal breath sounds.  Abdominal:     General: Bowel sounds are normal.     Palpations: Abdomen is soft. There is no hepatomegaly, splenomegaly or mass.     Tenderness: There is no abdominal tenderness.     Hernia: No hernia is present.  Musculoskeletal:        General: No tenderness.     Cervical back: Neck supple.     Right lower leg: No edema.     Left lower leg: No edema.     Right Lower Extremity: Right leg is amputated below knee.     Left Lower Extremity: Left leg is amputated below knee.  Skin:    Findings: No rash.  Neurological:     Mental Status: She is alert and oriented to person, place, and time.     Motor: No weakness.  Psychiatric:        Mood and Affect: Mood and affect normal.        Behavior: Behavior normal.     BP (!) 164/80   Pulse 82  Wt Readings from Last 3 Encounters:  02/01/18 163 lb 1.6 oz (74 kg)  09/20/16 158 lb (71.7 kg)  04/12/16 163 lb (73.9 kg)    Health Maintenance Due  Topic Date Due  . Hepatitis C Screening  Never done  . PNEUMOCOCCAL POLYSACCHARIDE VACCINE AGE 17-64 HIGH RISK  Never done  . FOOT EXAM  Never done   . OPHTHALMOLOGY EXAM  Never done  . COVID-19 Vaccine (1) Never done  . TETANUS/TDAP  Never done  . PAP SMEAR-Modifier  Never done  . MAMMOGRAM  Never done  . COLONOSCOPY  Never done  . HEMOGLOBIN A1C  07/31/2018  . INFLUENZA VACCINE  09/28/2019    There are no preventive care reminders to display for this patient.  CBC Latest Ref Rng & Units 02/01/2018 01/31/2018 01/30/2018  WBC 4.0 - 10.5 K/uL 10.7(H) 9.3 9.4  Hemoglobin 12.0 - 15.0 g/dL 10.8(L) 10.8(L) 10.9(L)  Hematocrit 36 - 46 % 33.8(L) 33.8(L) 34.4(L)  Platelets 150 - 400 K/uL 266 263 275   CMP Latest Ref Rng & Units 02/01/2018 01/30/2018 01/29/2018  Glucose 70 - 99 mg/dL 190(H) 252(H) 243(H)  BUN 6 - 20 mg/dL 39(H) 26(H) 24(H)  Creatinine 0.44 - 1.00 mg/dL 1.50(H) 1.30(H) 0.93  Sodium 135 - 145 mmol/L 141 140 133(L)  Potassium 3.5 - 5.1 mmol/L 4.2 3.7 3.7  Chloride 98 - 111 mmol/L 100 101 95(L)  CO2 22 - 32 mmol/L 37(H) 33(H) 31  Calcium 8.9 - 10.3 mg/dL 8.7(L) 8.3(L) 8.5(L)  Total Protein 6.5 - 8.1 g/dL - - 6.4(L)  Total Bilirubin 0.3 - 1.2 mg/dL - - 0.5  Alkaline Phos 38 - 126 U/L - - 194(H)  AST 15 - 41 U/L - - 51(H)  ALT 0 - 44 U/L - - 74(H)    Lab Results  Component Value Date   TSH 2.745 01/29/2018   Lab Results  Component  Value Date   ALBUMIN 2.8 (L) 01/29/2018   ANIONGAP 4 (L) 02/01/2018   Lab Results  Component Value Date   CHOL 286 (H) 01/29/2018   CHOL 227 (H) 08/22/2011   HDL 35 (L) 01/29/2018   HDL 30 (L) 08/22/2011   LDLCALC 216 (H) 01/29/2018   LDLCALC SEE COMMENT 08/22/2011   CHOLHDL 8.2 01/29/2018   Lab Results  Component Value Date   TRIG 173 (H) 01/29/2018   Lab Results  Component Value Date   HGBA1C 9.7 (H) 01/29/2018   HGBA1C 11.4 (H) 10/13/2011   HGBA1C 11.6 (H) 08/22/2011      ASSESSMENT & PLAN:   Problem List Items Addressed This Visit      Respiratory   Simple chronic bronchitis (HCC)    Pt has quit smoking  , chest  Is clear        Endocrine   Diabetes mellitus  (HCC) - Primary    BS  138,  Continue  Sliding  Scale  insulin      Relevant Medications   HUMULIN 70/30 (70-30) 100 UNIT/ML injection     Other   Fibromyalgia    Fibromyalgia is stable on todays visit.       Amputated right leg (HCC)    Pt is known to have T2DM, CAD, Stroke, Fibromyalgia, hx of MI 2019, angioplasty 2020. Her blood sugar is 138 today. She had bilateral amputation of lower limbs below the knee on 10/21/2019.       Amputated left leg ([place])    Since last visit here, pt has amputation of both legs below knee.          Meds ordered this encounter  Medications  . ezetimibe (ZETIA) 10 MG tablet    Sig: Take 1 tablet (10 mg total) by mouth daily.    Dispense:  60 tablet    Refill:  4  . HUMULIN 70/30 (70-30) 100 UNIT/ML injection    Sig: Inject 30 Units into the skin 2 (two) times daily with a meal.    Dispense:  10 mL    Refill:  6    Follow-up: No follow-ups on file.    Cletis Athens, MD [facility] [address], [place], [place] [address]   By signing my name below, I, [employer], attest that this documentation has been prepared under the direction and in the presence of Dr. Cletis Athens Electronically Signed: Cletis Athens, MD 11/11/19, 9:30 AM  I personally performed the services described in this documentation, which was SCRIBED in my presence. The recorded information has been reviewed and considered accurate. It has been edited as necessary during review. Cletis Athens, MD

## 2019-11-11 NOTE — Assessment & Plan Note (Signed)
Since last visit here, pt has amputation of both legs below knee.

## 2019-11-11 NOTE — Assessment & Plan Note (Signed)
Pt is known to have T2DM, CAD, Stroke, Fibromyalgia, hx of MI 2019, angioplasty 2020. Her blood sugar is 138 today. She had bilateral amputation of lower limbs below the knee on 10/21/2019.

## 2019-11-11 NOTE — Assessment & Plan Note (Signed)
Pt has quit smoking  , chest  Is clear

## 2019-11-12 DIAGNOSIS — Z4781 Encounter for orthopedic aftercare following surgical amputation: Secondary | ICD-10-CM | POA: Diagnosis not present

## 2019-11-12 DIAGNOSIS — E11621 Type 2 diabetes mellitus with foot ulcer: Secondary | ICD-10-CM | POA: Diagnosis not present

## 2019-11-12 DIAGNOSIS — Z4801 Encounter for change or removal of surgical wound dressing: Secondary | ICD-10-CM | POA: Diagnosis not present

## 2019-11-12 DIAGNOSIS — L97419 Non-pressure chronic ulcer of right heel and midfoot with unspecified severity: Secondary | ICD-10-CM | POA: Diagnosis not present

## 2019-11-12 DIAGNOSIS — L97516 Non-pressure chronic ulcer of other part of right foot with bone involvement without evidence of necrosis: Secondary | ICD-10-CM | POA: Diagnosis not present

## 2019-11-12 DIAGNOSIS — E1152 Type 2 diabetes mellitus with diabetic peripheral angiopathy with gangrene: Secondary | ICD-10-CM | POA: Diagnosis not present

## 2019-11-13 DIAGNOSIS — Z4801 Encounter for change or removal of surgical wound dressing: Secondary | ICD-10-CM | POA: Diagnosis not present

## 2019-11-13 DIAGNOSIS — L97419 Non-pressure chronic ulcer of right heel and midfoot with unspecified severity: Secondary | ICD-10-CM | POA: Diagnosis not present

## 2019-11-13 DIAGNOSIS — L97516 Non-pressure chronic ulcer of other part of right foot with bone involvement without evidence of necrosis: Secondary | ICD-10-CM | POA: Diagnosis not present

## 2019-11-13 DIAGNOSIS — E11621 Type 2 diabetes mellitus with foot ulcer: Secondary | ICD-10-CM | POA: Diagnosis not present

## 2019-11-13 DIAGNOSIS — E1152 Type 2 diabetes mellitus with diabetic peripheral angiopathy with gangrene: Secondary | ICD-10-CM | POA: Diagnosis not present

## 2019-11-13 DIAGNOSIS — Z4781 Encounter for orthopedic aftercare following surgical amputation: Secondary | ICD-10-CM | POA: Diagnosis not present

## 2019-11-14 DIAGNOSIS — L97419 Non-pressure chronic ulcer of right heel and midfoot with unspecified severity: Secondary | ICD-10-CM | POA: Diagnosis not present

## 2019-11-14 DIAGNOSIS — Z4781 Encounter for orthopedic aftercare following surgical amputation: Secondary | ICD-10-CM | POA: Diagnosis not present

## 2019-11-14 DIAGNOSIS — E1152 Type 2 diabetes mellitus with diabetic peripheral angiopathy with gangrene: Secondary | ICD-10-CM | POA: Diagnosis not present

## 2019-11-14 DIAGNOSIS — Z4801 Encounter for change or removal of surgical wound dressing: Secondary | ICD-10-CM | POA: Diagnosis not present

## 2019-11-14 DIAGNOSIS — E11621 Type 2 diabetes mellitus with foot ulcer: Secondary | ICD-10-CM | POA: Diagnosis not present

## 2019-11-14 DIAGNOSIS — L97516 Non-pressure chronic ulcer of other part of right foot with bone involvement without evidence of necrosis: Secondary | ICD-10-CM | POA: Diagnosis not present

## 2019-11-18 DIAGNOSIS — L97516 Non-pressure chronic ulcer of other part of right foot with bone involvement without evidence of necrosis: Secondary | ICD-10-CM | POA: Diagnosis not present

## 2019-11-18 DIAGNOSIS — Z4781 Encounter for orthopedic aftercare following surgical amputation: Secondary | ICD-10-CM | POA: Diagnosis not present

## 2019-11-18 DIAGNOSIS — L97419 Non-pressure chronic ulcer of right heel and midfoot with unspecified severity: Secondary | ICD-10-CM | POA: Diagnosis not present

## 2019-11-18 DIAGNOSIS — E11621 Type 2 diabetes mellitus with foot ulcer: Secondary | ICD-10-CM | POA: Diagnosis not present

## 2019-11-18 DIAGNOSIS — E1152 Type 2 diabetes mellitus with diabetic peripheral angiopathy with gangrene: Secondary | ICD-10-CM | POA: Diagnosis not present

## 2019-11-18 DIAGNOSIS — Z4801 Encounter for change or removal of surgical wound dressing: Secondary | ICD-10-CM | POA: Diagnosis not present

## 2019-11-19 DIAGNOSIS — L97516 Non-pressure chronic ulcer of other part of right foot with bone involvement without evidence of necrosis: Secondary | ICD-10-CM | POA: Diagnosis not present

## 2019-11-19 DIAGNOSIS — E1152 Type 2 diabetes mellitus with diabetic peripheral angiopathy with gangrene: Secondary | ICD-10-CM | POA: Diagnosis not present

## 2019-11-19 DIAGNOSIS — L97419 Non-pressure chronic ulcer of right heel and midfoot with unspecified severity: Secondary | ICD-10-CM | POA: Diagnosis not present

## 2019-11-19 DIAGNOSIS — Z4801 Encounter for change or removal of surgical wound dressing: Secondary | ICD-10-CM | POA: Diagnosis not present

## 2019-11-19 DIAGNOSIS — E11621 Type 2 diabetes mellitus with foot ulcer: Secondary | ICD-10-CM | POA: Diagnosis not present

## 2019-11-19 DIAGNOSIS — Z4781 Encounter for orthopedic aftercare following surgical amputation: Secondary | ICD-10-CM | POA: Diagnosis not present

## 2019-11-20 DIAGNOSIS — L97516 Non-pressure chronic ulcer of other part of right foot with bone involvement without evidence of necrosis: Secondary | ICD-10-CM | POA: Diagnosis not present

## 2019-11-20 DIAGNOSIS — E11621 Type 2 diabetes mellitus with foot ulcer: Secondary | ICD-10-CM | POA: Diagnosis not present

## 2019-11-20 DIAGNOSIS — E1152 Type 2 diabetes mellitus with diabetic peripheral angiopathy with gangrene: Secondary | ICD-10-CM | POA: Diagnosis not present

## 2019-11-20 DIAGNOSIS — L97419 Non-pressure chronic ulcer of right heel and midfoot with unspecified severity: Secondary | ICD-10-CM | POA: Diagnosis not present

## 2019-11-20 DIAGNOSIS — Z4781 Encounter for orthopedic aftercare following surgical amputation: Secondary | ICD-10-CM | POA: Diagnosis not present

## 2019-11-20 DIAGNOSIS — Z4801 Encounter for change or removal of surgical wound dressing: Secondary | ICD-10-CM | POA: Diagnosis not present

## 2019-11-24 DIAGNOSIS — Z9049 Acquired absence of other specified parts of digestive tract: Secondary | ICD-10-CM | POA: Diagnosis not present

## 2019-11-24 DIAGNOSIS — E119 Type 2 diabetes mellitus without complications: Secondary | ICD-10-CM | POA: Diagnosis not present

## 2019-11-24 DIAGNOSIS — Z955 Presence of coronary angioplasty implant and graft: Secondary | ICD-10-CM | POA: Diagnosis not present

## 2019-11-24 DIAGNOSIS — I5023 Acute on chronic systolic (congestive) heart failure: Secondary | ICD-10-CM | POA: Diagnosis not present

## 2019-11-24 DIAGNOSIS — R0989 Other specified symptoms and signs involving the circulatory and respiratory systems: Secondary | ICD-10-CM | POA: Diagnosis not present

## 2019-11-24 DIAGNOSIS — Z8673 Personal history of transient ischemic attack (TIA), and cerebral infarction without residual deficits: Secondary | ICD-10-CM | POA: Diagnosis not present

## 2019-11-24 DIAGNOSIS — Z7902 Long term (current) use of antithrombotics/antiplatelets: Secondary | ICD-10-CM | POA: Diagnosis not present

## 2019-11-24 DIAGNOSIS — Z89511 Acquired absence of right leg below knee: Secondary | ICD-10-CM | POA: Diagnosis not present

## 2019-11-24 DIAGNOSIS — I5022 Chronic systolic (congestive) heart failure: Secondary | ICD-10-CM | POA: Diagnosis not present

## 2019-11-24 DIAGNOSIS — M797 Fibromyalgia: Secondary | ICD-10-CM | POA: Diagnosis not present

## 2019-11-24 DIAGNOSIS — I2510 Atherosclerotic heart disease of native coronary artery without angina pectoris: Secondary | ICD-10-CM | POA: Diagnosis not present

## 2019-11-24 DIAGNOSIS — R32 Unspecified urinary incontinence: Secondary | ICD-10-CM | POA: Diagnosis not present

## 2019-11-24 DIAGNOSIS — Z89512 Acquired absence of left leg below knee: Secondary | ICD-10-CM | POA: Diagnosis not present

## 2019-11-24 DIAGNOSIS — Z7982 Long term (current) use of aspirin: Secondary | ICD-10-CM | POA: Diagnosis not present

## 2019-11-24 DIAGNOSIS — Z888 Allergy status to other drugs, medicaments and biological substances status: Secondary | ICD-10-CM | POA: Diagnosis not present

## 2019-11-24 DIAGNOSIS — Z794 Long term (current) use of insulin: Secondary | ICD-10-CM | POA: Diagnosis not present

## 2019-11-24 DIAGNOSIS — I959 Hypotension, unspecified: Secondary | ICD-10-CM | POA: Diagnosis not present

## 2019-11-24 DIAGNOSIS — I110 Hypertensive heart disease with heart failure: Secondary | ICD-10-CM | POA: Diagnosis not present

## 2019-11-24 DIAGNOSIS — R339 Retention of urine, unspecified: Secondary | ICD-10-CM | POA: Diagnosis not present

## 2019-11-24 DIAGNOSIS — Z79899 Other long term (current) drug therapy: Secondary | ICD-10-CM | POA: Diagnosis not present

## 2019-11-25 DIAGNOSIS — L97419 Non-pressure chronic ulcer of right heel and midfoot with unspecified severity: Secondary | ICD-10-CM | POA: Diagnosis not present

## 2019-11-25 DIAGNOSIS — Z4801 Encounter for change or removal of surgical wound dressing: Secondary | ICD-10-CM | POA: Diagnosis not present

## 2019-11-25 DIAGNOSIS — L97516 Non-pressure chronic ulcer of other part of right foot with bone involvement without evidence of necrosis: Secondary | ICD-10-CM | POA: Diagnosis not present

## 2019-11-25 DIAGNOSIS — E1152 Type 2 diabetes mellitus with diabetic peripheral angiopathy with gangrene: Secondary | ICD-10-CM | POA: Diagnosis not present

## 2019-11-25 DIAGNOSIS — Z4781 Encounter for orthopedic aftercare following surgical amputation: Secondary | ICD-10-CM | POA: Diagnosis not present

## 2019-11-25 DIAGNOSIS — E11621 Type 2 diabetes mellitus with foot ulcer: Secondary | ICD-10-CM | POA: Diagnosis not present

## 2019-11-26 DIAGNOSIS — Z09 Encounter for follow-up examination after completed treatment for conditions other than malignant neoplasm: Secondary | ICD-10-CM | POA: Diagnosis not present

## 2019-11-26 DIAGNOSIS — Z89511 Acquired absence of right leg below knee: Secondary | ICD-10-CM | POA: Diagnosis not present

## 2019-11-26 DIAGNOSIS — Z89512 Acquired absence of left leg below knee: Secondary | ICD-10-CM | POA: Diagnosis not present

## 2019-11-27 DIAGNOSIS — Z4781 Encounter for orthopedic aftercare following surgical amputation: Secondary | ICD-10-CM | POA: Diagnosis not present

## 2019-11-27 DIAGNOSIS — L97516 Non-pressure chronic ulcer of other part of right foot with bone involvement without evidence of necrosis: Secondary | ICD-10-CM | POA: Diagnosis not present

## 2019-11-27 DIAGNOSIS — E11621 Type 2 diabetes mellitus with foot ulcer: Secondary | ICD-10-CM | POA: Diagnosis not present

## 2019-11-27 DIAGNOSIS — E1152 Type 2 diabetes mellitus with diabetic peripheral angiopathy with gangrene: Secondary | ICD-10-CM | POA: Diagnosis not present

## 2019-11-27 DIAGNOSIS — Z4801 Encounter for change or removal of surgical wound dressing: Secondary | ICD-10-CM | POA: Diagnosis not present

## 2019-11-27 DIAGNOSIS — L97419 Non-pressure chronic ulcer of right heel and midfoot with unspecified severity: Secondary | ICD-10-CM | POA: Diagnosis not present

## 2019-12-02 DIAGNOSIS — Z4801 Encounter for change or removal of surgical wound dressing: Secondary | ICD-10-CM | POA: Diagnosis not present

## 2019-12-02 DIAGNOSIS — L97516 Non-pressure chronic ulcer of other part of right foot with bone involvement without evidence of necrosis: Secondary | ICD-10-CM | POA: Diagnosis not present

## 2019-12-02 DIAGNOSIS — Z4781 Encounter for orthopedic aftercare following surgical amputation: Secondary | ICD-10-CM | POA: Diagnosis not present

## 2019-12-02 DIAGNOSIS — E11621 Type 2 diabetes mellitus with foot ulcer: Secondary | ICD-10-CM | POA: Diagnosis not present

## 2019-12-02 DIAGNOSIS — E1152 Type 2 diabetes mellitus with diabetic peripheral angiopathy with gangrene: Secondary | ICD-10-CM | POA: Diagnosis not present

## 2019-12-02 DIAGNOSIS — L97419 Non-pressure chronic ulcer of right heel and midfoot with unspecified severity: Secondary | ICD-10-CM | POA: Diagnosis not present

## 2019-12-03 DIAGNOSIS — I5022 Chronic systolic (congestive) heart failure: Secondary | ICD-10-CM | POA: Diagnosis not present

## 2019-12-03 DIAGNOSIS — E11621 Type 2 diabetes mellitus with foot ulcer: Secondary | ICD-10-CM | POA: Diagnosis not present

## 2019-12-03 DIAGNOSIS — Z4781 Encounter for orthopedic aftercare following surgical amputation: Secondary | ICD-10-CM | POA: Diagnosis not present

## 2019-12-03 DIAGNOSIS — L97419 Non-pressure chronic ulcer of right heel and midfoot with unspecified severity: Secondary | ICD-10-CM | POA: Diagnosis not present

## 2019-12-03 DIAGNOSIS — L97516 Non-pressure chronic ulcer of other part of right foot with bone involvement without evidence of necrosis: Secondary | ICD-10-CM | POA: Diagnosis not present

## 2019-12-03 DIAGNOSIS — Z4801 Encounter for change or removal of surgical wound dressing: Secondary | ICD-10-CM | POA: Diagnosis not present

## 2019-12-03 DIAGNOSIS — E1152 Type 2 diabetes mellitus with diabetic peripheral angiopathy with gangrene: Secondary | ICD-10-CM | POA: Diagnosis not present

## 2019-12-05 DIAGNOSIS — G4700 Insomnia, unspecified: Secondary | ICD-10-CM | POA: Diagnosis not present

## 2019-12-05 DIAGNOSIS — D649 Anemia, unspecified: Secondary | ICD-10-CM | POA: Diagnosis not present

## 2019-12-05 DIAGNOSIS — E1152 Type 2 diabetes mellitus with diabetic peripheral angiopathy with gangrene: Secondary | ICD-10-CM | POA: Diagnosis not present

## 2019-12-05 DIAGNOSIS — Z79891 Long term (current) use of opiate analgesic: Secondary | ICD-10-CM | POA: Diagnosis not present

## 2019-12-05 DIAGNOSIS — I2510 Atherosclerotic heart disease of native coronary artery without angina pectoris: Secondary | ICD-10-CM | POA: Diagnosis not present

## 2019-12-05 DIAGNOSIS — Z8631 Personal history of diabetic foot ulcer: Secondary | ICD-10-CM | POA: Diagnosis not present

## 2019-12-05 DIAGNOSIS — Z4781 Encounter for orthopedic aftercare following surgical amputation: Secondary | ICD-10-CM | POA: Diagnosis not present

## 2019-12-05 DIAGNOSIS — N319 Neuromuscular dysfunction of bladder, unspecified: Secondary | ICD-10-CM | POA: Diagnosis not present

## 2019-12-05 DIAGNOSIS — Z79899 Other long term (current) drug therapy: Secondary | ICD-10-CM | POA: Diagnosis not present

## 2019-12-05 DIAGNOSIS — M545 Low back pain, unspecified: Secondary | ICD-10-CM | POA: Diagnosis not present

## 2019-12-05 DIAGNOSIS — Z89511 Acquired absence of right leg below knee: Secondary | ICD-10-CM | POA: Diagnosis not present

## 2019-12-05 DIAGNOSIS — Z466 Encounter for fitting and adjustment of urinary device: Secondary | ICD-10-CM | POA: Diagnosis not present

## 2019-12-05 DIAGNOSIS — S88119A Complete traumatic amputation at level between knee and ankle, unspecified lower leg, initial encounter: Secondary | ICD-10-CM | POA: Diagnosis not present

## 2019-12-05 DIAGNOSIS — F17210 Nicotine dependence, cigarettes, uncomplicated: Secondary | ICD-10-CM | POA: Diagnosis not present

## 2019-12-05 DIAGNOSIS — I110 Hypertensive heart disease with heart failure: Secondary | ICD-10-CM | POA: Diagnosis not present

## 2019-12-05 DIAGNOSIS — R32 Unspecified urinary incontinence: Secondary | ICD-10-CM | POA: Diagnosis not present

## 2019-12-05 DIAGNOSIS — I252 Old myocardial infarction: Secondary | ICD-10-CM | POA: Diagnosis not present

## 2019-12-05 DIAGNOSIS — T82111D Breakdown (mechanical) of cardiac pulse generator (battery), subsequent encounter: Secondary | ICD-10-CM | POA: Diagnosis not present

## 2019-12-05 DIAGNOSIS — Z7982 Long term (current) use of aspirin: Secondary | ICD-10-CM | POA: Diagnosis not present

## 2019-12-05 DIAGNOSIS — R3914 Feeling of incomplete bladder emptying: Secondary | ICD-10-CM | POA: Diagnosis not present

## 2019-12-05 DIAGNOSIS — Z794 Long term (current) use of insulin: Secondary | ICD-10-CM | POA: Diagnosis not present

## 2019-12-05 DIAGNOSIS — M797 Fibromyalgia: Secondary | ICD-10-CM | POA: Diagnosis not present

## 2019-12-05 DIAGNOSIS — Z89512 Acquired absence of left leg below knee: Secondary | ICD-10-CM | POA: Diagnosis not present

## 2019-12-05 DIAGNOSIS — Z4801 Encounter for change or removal of surgical wound dressing: Secondary | ICD-10-CM | POA: Diagnosis not present

## 2019-12-05 DIAGNOSIS — K5900 Constipation, unspecified: Secondary | ICD-10-CM | POA: Diagnosis not present

## 2019-12-05 DIAGNOSIS — R339 Retention of urine, unspecified: Secondary | ICD-10-CM | POA: Diagnosis not present

## 2019-12-05 DIAGNOSIS — I5023 Acute on chronic systolic (congestive) heart failure: Secondary | ICD-10-CM | POA: Diagnosis not present

## 2019-12-05 DIAGNOSIS — Z7902 Long term (current) use of antithrombotics/antiplatelets: Secondary | ICD-10-CM | POA: Diagnosis not present

## 2019-12-08 DIAGNOSIS — I5023 Acute on chronic systolic (congestive) heart failure: Secondary | ICD-10-CM | POA: Diagnosis not present

## 2019-12-08 DIAGNOSIS — Z4781 Encounter for orthopedic aftercare following surgical amputation: Secondary | ICD-10-CM | POA: Diagnosis not present

## 2019-12-08 DIAGNOSIS — Z4801 Encounter for change or removal of surgical wound dressing: Secondary | ICD-10-CM | POA: Diagnosis not present

## 2019-12-08 DIAGNOSIS — I252 Old myocardial infarction: Secondary | ICD-10-CM | POA: Diagnosis not present

## 2019-12-08 DIAGNOSIS — I2510 Atherosclerotic heart disease of native coronary artery without angina pectoris: Secondary | ICD-10-CM | POA: Diagnosis not present

## 2019-12-08 DIAGNOSIS — I110 Hypertensive heart disease with heart failure: Secondary | ICD-10-CM | POA: Diagnosis not present

## 2019-12-09 DIAGNOSIS — I5023 Acute on chronic systolic (congestive) heart failure: Secondary | ICD-10-CM | POA: Diagnosis not present

## 2019-12-09 DIAGNOSIS — I110 Hypertensive heart disease with heart failure: Secondary | ICD-10-CM | POA: Diagnosis not present

## 2019-12-09 DIAGNOSIS — I252 Old myocardial infarction: Secondary | ICD-10-CM | POA: Diagnosis not present

## 2019-12-09 DIAGNOSIS — Z4781 Encounter for orthopedic aftercare following surgical amputation: Secondary | ICD-10-CM | POA: Diagnosis not present

## 2019-12-09 DIAGNOSIS — I2510 Atherosclerotic heart disease of native coronary artery without angina pectoris: Secondary | ICD-10-CM | POA: Diagnosis not present

## 2019-12-09 DIAGNOSIS — Z4801 Encounter for change or removal of surgical wound dressing: Secondary | ICD-10-CM | POA: Diagnosis not present

## 2019-12-11 DIAGNOSIS — I252 Old myocardial infarction: Secondary | ICD-10-CM | POA: Diagnosis not present

## 2019-12-11 DIAGNOSIS — I2510 Atherosclerotic heart disease of native coronary artery without angina pectoris: Secondary | ICD-10-CM | POA: Diagnosis not present

## 2019-12-11 DIAGNOSIS — Z4781 Encounter for orthopedic aftercare following surgical amputation: Secondary | ICD-10-CM | POA: Diagnosis not present

## 2019-12-11 DIAGNOSIS — Z4801 Encounter for change or removal of surgical wound dressing: Secondary | ICD-10-CM | POA: Diagnosis not present

## 2019-12-11 DIAGNOSIS — I5023 Acute on chronic systolic (congestive) heart failure: Secondary | ICD-10-CM | POA: Diagnosis not present

## 2019-12-11 DIAGNOSIS — I110 Hypertensive heart disease with heart failure: Secondary | ICD-10-CM | POA: Diagnosis not present

## 2019-12-12 ENCOUNTER — Ambulatory Visit: Payer: Medicare Other | Admitting: Family Medicine

## 2019-12-15 DIAGNOSIS — Z955 Presence of coronary angioplasty implant and graft: Secondary | ICD-10-CM | POA: Diagnosis not present

## 2019-12-15 DIAGNOSIS — M797 Fibromyalgia: Secondary | ICD-10-CM | POA: Diagnosis not present

## 2019-12-15 DIAGNOSIS — Z79899 Other long term (current) drug therapy: Secondary | ICD-10-CM | POA: Diagnosis not present

## 2019-12-15 DIAGNOSIS — Z7902 Long term (current) use of antithrombotics/antiplatelets: Secondary | ICD-10-CM | POA: Diagnosis not present

## 2019-12-15 DIAGNOSIS — Z9049 Acquired absence of other specified parts of digestive tract: Secondary | ICD-10-CM | POA: Diagnosis not present

## 2019-12-15 DIAGNOSIS — I2510 Atherosclerotic heart disease of native coronary artery without angina pectoris: Secondary | ICD-10-CM | POA: Diagnosis not present

## 2019-12-15 DIAGNOSIS — Z87891 Personal history of nicotine dependence: Secondary | ICD-10-CM | POA: Diagnosis not present

## 2019-12-15 DIAGNOSIS — Z8673 Personal history of transient ischemic attack (TIA), and cerebral infarction without residual deficits: Secondary | ICD-10-CM | POA: Diagnosis not present

## 2019-12-15 DIAGNOSIS — R3981 Functional urinary incontinence: Secondary | ICD-10-CM | POA: Diagnosis not present

## 2019-12-15 DIAGNOSIS — R339 Retention of urine, unspecified: Secondary | ICD-10-CM | POA: Diagnosis not present

## 2019-12-15 DIAGNOSIS — Z466 Encounter for fitting and adjustment of urinary device: Secondary | ICD-10-CM | POA: Diagnosis not present

## 2019-12-15 DIAGNOSIS — E119 Type 2 diabetes mellitus without complications: Secondary | ICD-10-CM | POA: Diagnosis not present

## 2019-12-15 DIAGNOSIS — I10 Essential (primary) hypertension: Secondary | ICD-10-CM | POA: Diagnosis not present

## 2019-12-15 DIAGNOSIS — Z7982 Long term (current) use of aspirin: Secondary | ICD-10-CM | POA: Diagnosis not present

## 2019-12-16 DIAGNOSIS — I5023 Acute on chronic systolic (congestive) heart failure: Secondary | ICD-10-CM | POA: Diagnosis not present

## 2019-12-16 DIAGNOSIS — I110 Hypertensive heart disease with heart failure: Secondary | ICD-10-CM | POA: Diagnosis not present

## 2019-12-16 DIAGNOSIS — I2510 Atherosclerotic heart disease of native coronary artery without angina pectoris: Secondary | ICD-10-CM | POA: Diagnosis not present

## 2019-12-16 DIAGNOSIS — Z4801 Encounter for change or removal of surgical wound dressing: Secondary | ICD-10-CM | POA: Diagnosis not present

## 2019-12-16 DIAGNOSIS — Z4781 Encounter for orthopedic aftercare following surgical amputation: Secondary | ICD-10-CM | POA: Diagnosis not present

## 2019-12-16 DIAGNOSIS — I252 Old myocardial infarction: Secondary | ICD-10-CM | POA: Diagnosis not present

## 2019-12-23 DIAGNOSIS — I2510 Atherosclerotic heart disease of native coronary artery without angina pectoris: Secondary | ICD-10-CM | POA: Diagnosis not present

## 2019-12-23 DIAGNOSIS — I252 Old myocardial infarction: Secondary | ICD-10-CM | POA: Diagnosis not present

## 2019-12-23 DIAGNOSIS — I110 Hypertensive heart disease with heart failure: Secondary | ICD-10-CM | POA: Diagnosis not present

## 2019-12-23 DIAGNOSIS — I5023 Acute on chronic systolic (congestive) heart failure: Secondary | ICD-10-CM | POA: Diagnosis not present

## 2019-12-23 DIAGNOSIS — Z4781 Encounter for orthopedic aftercare following surgical amputation: Secondary | ICD-10-CM | POA: Diagnosis not present

## 2019-12-23 DIAGNOSIS — Z4801 Encounter for change or removal of surgical wound dressing: Secondary | ICD-10-CM | POA: Diagnosis not present

## 2019-12-24 DIAGNOSIS — I110 Hypertensive heart disease with heart failure: Secondary | ICD-10-CM | POA: Diagnosis not present

## 2019-12-24 DIAGNOSIS — I252 Old myocardial infarction: Secondary | ICD-10-CM | POA: Diagnosis not present

## 2019-12-24 DIAGNOSIS — I5023 Acute on chronic systolic (congestive) heart failure: Secondary | ICD-10-CM | POA: Diagnosis not present

## 2019-12-24 DIAGNOSIS — Z4801 Encounter for change or removal of surgical wound dressing: Secondary | ICD-10-CM | POA: Diagnosis not present

## 2019-12-24 DIAGNOSIS — I2510 Atherosclerotic heart disease of native coronary artery without angina pectoris: Secondary | ICD-10-CM | POA: Diagnosis not present

## 2019-12-24 DIAGNOSIS — Z4781 Encounter for orthopedic aftercare following surgical amputation: Secondary | ICD-10-CM | POA: Diagnosis not present

## 2019-12-30 DIAGNOSIS — Z4781 Encounter for orthopedic aftercare following surgical amputation: Secondary | ICD-10-CM | POA: Diagnosis not present

## 2019-12-30 DIAGNOSIS — I2510 Atherosclerotic heart disease of native coronary artery without angina pectoris: Secondary | ICD-10-CM | POA: Diagnosis not present

## 2019-12-30 DIAGNOSIS — Z4801 Encounter for change or removal of surgical wound dressing: Secondary | ICD-10-CM | POA: Diagnosis not present

## 2019-12-30 DIAGNOSIS — I252 Old myocardial infarction: Secondary | ICD-10-CM | POA: Diagnosis not present

## 2019-12-30 DIAGNOSIS — I5023 Acute on chronic systolic (congestive) heart failure: Secondary | ICD-10-CM | POA: Diagnosis not present

## 2019-12-30 DIAGNOSIS — I110 Hypertensive heart disease with heart failure: Secondary | ICD-10-CM | POA: Diagnosis not present

## 2019-12-31 DIAGNOSIS — S88119A Complete traumatic amputation at level between knee and ankle, unspecified lower leg, initial encounter: Secondary | ICD-10-CM | POA: Diagnosis not present

## 2019-12-31 DIAGNOSIS — G546 Phantom limb syndrome with pain: Secondary | ICD-10-CM | POA: Diagnosis not present

## 2019-12-31 DIAGNOSIS — Z09 Encounter for follow-up examination after completed treatment for conditions other than malignant neoplasm: Secondary | ICD-10-CM | POA: Diagnosis not present

## 2020-01-01 DIAGNOSIS — Z4781 Encounter for orthopedic aftercare following surgical amputation: Secondary | ICD-10-CM | POA: Diagnosis not present

## 2020-01-01 DIAGNOSIS — Z4801 Encounter for change or removal of surgical wound dressing: Secondary | ICD-10-CM | POA: Diagnosis not present

## 2020-01-01 DIAGNOSIS — I252 Old myocardial infarction: Secondary | ICD-10-CM | POA: Diagnosis not present

## 2020-01-01 DIAGNOSIS — I110 Hypertensive heart disease with heart failure: Secondary | ICD-10-CM | POA: Diagnosis not present

## 2020-01-01 DIAGNOSIS — I2510 Atherosclerotic heart disease of native coronary artery without angina pectoris: Secondary | ICD-10-CM | POA: Diagnosis not present

## 2020-01-01 DIAGNOSIS — I5023 Acute on chronic systolic (congestive) heart failure: Secondary | ICD-10-CM | POA: Diagnosis not present

## 2020-01-04 DIAGNOSIS — I252 Old myocardial infarction: Secondary | ICD-10-CM | POA: Diagnosis not present

## 2020-01-04 DIAGNOSIS — Z79891 Long term (current) use of opiate analgesic: Secondary | ICD-10-CM | POA: Diagnosis not present

## 2020-01-04 DIAGNOSIS — G4700 Insomnia, unspecified: Secondary | ICD-10-CM | POA: Diagnosis not present

## 2020-01-04 DIAGNOSIS — Z89511 Acquired absence of right leg below knee: Secondary | ICD-10-CM | POA: Diagnosis not present

## 2020-01-04 DIAGNOSIS — Z8631 Personal history of diabetic foot ulcer: Secondary | ICD-10-CM | POA: Diagnosis not present

## 2020-01-04 DIAGNOSIS — E1152 Type 2 diabetes mellitus with diabetic peripheral angiopathy with gangrene: Secondary | ICD-10-CM | POA: Diagnosis not present

## 2020-01-04 DIAGNOSIS — Z79899 Other long term (current) drug therapy: Secondary | ICD-10-CM | POA: Diagnosis not present

## 2020-01-04 DIAGNOSIS — Z4781 Encounter for orthopedic aftercare following surgical amputation: Secondary | ICD-10-CM | POA: Diagnosis not present

## 2020-01-04 DIAGNOSIS — I2510 Atherosclerotic heart disease of native coronary artery without angina pectoris: Secondary | ICD-10-CM | POA: Diagnosis not present

## 2020-01-04 DIAGNOSIS — M797 Fibromyalgia: Secondary | ICD-10-CM | POA: Diagnosis not present

## 2020-01-04 DIAGNOSIS — N319 Neuromuscular dysfunction of bladder, unspecified: Secondary | ICD-10-CM | POA: Diagnosis not present

## 2020-01-04 DIAGNOSIS — Z7902 Long term (current) use of antithrombotics/antiplatelets: Secondary | ICD-10-CM | POA: Diagnosis not present

## 2020-01-04 DIAGNOSIS — Z794 Long term (current) use of insulin: Secondary | ICD-10-CM | POA: Diagnosis not present

## 2020-01-04 DIAGNOSIS — M545 Low back pain, unspecified: Secondary | ICD-10-CM | POA: Diagnosis not present

## 2020-01-04 DIAGNOSIS — K5900 Constipation, unspecified: Secondary | ICD-10-CM | POA: Diagnosis not present

## 2020-01-04 DIAGNOSIS — Z7982 Long term (current) use of aspirin: Secondary | ICD-10-CM | POA: Diagnosis not present

## 2020-01-04 DIAGNOSIS — R3914 Feeling of incomplete bladder emptying: Secondary | ICD-10-CM | POA: Diagnosis not present

## 2020-01-04 DIAGNOSIS — R32 Unspecified urinary incontinence: Secondary | ICD-10-CM | POA: Diagnosis not present

## 2020-01-04 DIAGNOSIS — Z4801 Encounter for change or removal of surgical wound dressing: Secondary | ICD-10-CM | POA: Diagnosis not present

## 2020-01-04 DIAGNOSIS — I5023 Acute on chronic systolic (congestive) heart failure: Secondary | ICD-10-CM | POA: Diagnosis not present

## 2020-01-04 DIAGNOSIS — Z466 Encounter for fitting and adjustment of urinary device: Secondary | ICD-10-CM | POA: Diagnosis not present

## 2020-01-04 DIAGNOSIS — D649 Anemia, unspecified: Secondary | ICD-10-CM | POA: Diagnosis not present

## 2020-01-04 DIAGNOSIS — Z89512 Acquired absence of left leg below knee: Secondary | ICD-10-CM | POA: Diagnosis not present

## 2020-01-04 DIAGNOSIS — F17210 Nicotine dependence, cigarettes, uncomplicated: Secondary | ICD-10-CM | POA: Diagnosis not present

## 2020-01-04 DIAGNOSIS — I110 Hypertensive heart disease with heart failure: Secondary | ICD-10-CM | POA: Diagnosis not present

## 2020-01-11 ENCOUNTER — Other Ambulatory Visit: Payer: Self-pay | Admitting: Internal Medicine

## 2020-01-15 DIAGNOSIS — Z466 Encounter for fitting and adjustment of urinary device: Secondary | ICD-10-CM | POA: Diagnosis not present

## 2020-01-15 DIAGNOSIS — E119 Type 2 diabetes mellitus without complications: Secondary | ICD-10-CM | POA: Diagnosis not present

## 2020-01-15 DIAGNOSIS — Z435 Encounter for attention to cystostomy: Secondary | ICD-10-CM | POA: Diagnosis not present

## 2020-01-15 DIAGNOSIS — I10 Essential (primary) hypertension: Secondary | ICD-10-CM | POA: Diagnosis not present

## 2020-01-15 DIAGNOSIS — I2510 Atherosclerotic heart disease of native coronary artery without angina pectoris: Secondary | ICD-10-CM | POA: Diagnosis not present

## 2020-03-01 ENCOUNTER — Other Ambulatory Visit: Payer: Self-pay | Admitting: Internal Medicine

## 2020-04-27 DEATH — deceased

## 2020-05-14 ENCOUNTER — Other Ambulatory Visit: Payer: Self-pay | Admitting: Internal Medicine
